# Patient Record
Sex: Female | Born: 1960 | Race: White | Hispanic: No | Marital: Married | Smoking: Former smoker
Health system: Southern US, Community
[De-identification: ages and names within clinical notes are randomized; demographics above are authoritative.]

## PROBLEM LIST (undated history)

## (undated) DIAGNOSIS — Z720 Tobacco use: Secondary | ICD-10-CM

## (undated) DIAGNOSIS — I2510 Atherosclerotic heart disease of native coronary artery without angina pectoris: Secondary | ICD-10-CM

## (undated) DIAGNOSIS — E119 Type 2 diabetes mellitus without complications: Secondary | ICD-10-CM

## (undated) DIAGNOSIS — Z8489 Family history of other specified conditions: Secondary | ICD-10-CM

## (undated) DIAGNOSIS — I255 Ischemic cardiomyopathy: Secondary | ICD-10-CM

## (undated) DIAGNOSIS — I2109 ST elevation (STEMI) myocardial infarction involving other coronary artery of anterior wall: Secondary | ICD-10-CM

## (undated) DIAGNOSIS — E785 Hyperlipidemia, unspecified: Secondary | ICD-10-CM

## (undated) DIAGNOSIS — R0600 Dyspnea, unspecified: Secondary | ICD-10-CM

## (undated) DIAGNOSIS — K219 Gastro-esophageal reflux disease without esophagitis: Secondary | ICD-10-CM

## (undated) DIAGNOSIS — R0609 Other forms of dyspnea: Secondary | ICD-10-CM

## (undated) DIAGNOSIS — J40 Bronchitis, not specified as acute or chronic: Secondary | ICD-10-CM

## (undated) DIAGNOSIS — I10 Essential (primary) hypertension: Secondary | ICD-10-CM

## (undated) DIAGNOSIS — J449 Chronic obstructive pulmonary disease, unspecified: Secondary | ICD-10-CM

## (undated) DIAGNOSIS — Z789 Other specified health status: Secondary | ICD-10-CM

## (undated) DIAGNOSIS — I509 Heart failure, unspecified: Secondary | ICD-10-CM

## (undated) HISTORY — PX: IR BILIARY DRAIN PLACEMENT WITH CHOLANGIOGRAM: IMG6043

---

## 2005-05-13 ENCOUNTER — Emergency Department: Payer: Self-pay | Admitting: Emergency Medicine

## 2006-08-27 ENCOUNTER — Ambulatory Visit: Payer: Self-pay

## 2006-09-08 ENCOUNTER — Ambulatory Visit: Payer: Self-pay

## 2009-11-19 ENCOUNTER — Ambulatory Visit: Payer: Self-pay

## 2012-09-08 ENCOUNTER — Ambulatory Visit: Payer: Self-pay | Admitting: Family Medicine

## 2014-07-17 ENCOUNTER — Emergency Department: Payer: Self-pay | Admitting: Emergency Medicine

## 2014-10-04 ENCOUNTER — Ambulatory Visit: Payer: Self-pay

## 2015-09-02 DIAGNOSIS — I2109 ST elevation (STEMI) myocardial infarction involving other coronary artery of anterior wall: Secondary | ICD-10-CM

## 2015-09-02 HISTORY — DX: ST elevation (STEMI) myocardial infarction involving other coronary artery of anterior wall: I21.09

## 2016-02-24 ENCOUNTER — Emergency Department
Admission: EM | Admit: 2016-02-24 | Discharge: 2016-02-24 | Disposition: A | Discharge status: Other Acute Inpatient Hospital | Payer: BC Managed Care – PPO | Attending: Emergency Medicine | Admitting: Emergency Medicine

## 2016-02-24 ENCOUNTER — Ambulatory Visit (HOSPITAL_COMMUNITY): Admit: 2016-02-24 | Payer: Self-pay | Admitting: Cardiovascular Disease

## 2016-02-24 ENCOUNTER — Other Ambulatory Visit: Payer: Self-pay

## 2016-02-24 ENCOUNTER — Encounter (HOSPITAL_COMMUNITY)
Admission: EM | Disposition: A | Discharge status: Home / Self Care | Payer: Self-pay | Source: Ambulatory Visit | Attending: Cardiovascular Disease

## 2016-02-24 ENCOUNTER — Emergency Department: Payer: BC Managed Care – PPO

## 2016-02-24 ENCOUNTER — Inpatient Hospital Stay (HOSPITAL_COMMUNITY)
Admission: EM | Admit: 2016-02-24 | Discharge: 2016-02-28 | DRG: 247 | Disposition: A | Discharge status: Home / Self Care | Payer: BC Managed Care – PPO | Source: Ambulatory Visit | Attending: Cardiovascular Disease | Admitting: Cardiovascular Disease

## 2016-02-24 ENCOUNTER — Encounter: Payer: Self-pay | Admitting: Emergency Medicine

## 2016-02-24 ENCOUNTER — Encounter (HOSPITAL_COMMUNITY): Payer: Self-pay | Admitting: Physician Assistant

## 2016-02-24 DIAGNOSIS — I2102 ST elevation (STEMI) myocardial infarction involving left anterior descending coronary artery: Secondary | ICD-10-CM | POA: Diagnosis not present

## 2016-02-24 DIAGNOSIS — F419 Anxiety disorder, unspecified: Secondary | ICD-10-CM | POA: Diagnosis present

## 2016-02-24 DIAGNOSIS — F17210 Nicotine dependence, cigarettes, uncomplicated: Secondary | ICD-10-CM | POA: Diagnosis present

## 2016-02-24 DIAGNOSIS — Z720 Tobacco use: Secondary | ICD-10-CM | POA: Diagnosis present

## 2016-02-24 DIAGNOSIS — R079 Chest pain, unspecified: Secondary | ICD-10-CM | POA: Diagnosis present

## 2016-02-24 DIAGNOSIS — I213 ST elevation (STEMI) myocardial infarction of unspecified site: Secondary | ICD-10-CM | POA: Insufficient documentation

## 2016-02-24 DIAGNOSIS — I255 Ischemic cardiomyopathy: Secondary | ICD-10-CM | POA: Diagnosis not present

## 2016-02-24 DIAGNOSIS — Z6836 Body mass index (BMI) 36.0-36.9, adult: Secondary | ICD-10-CM

## 2016-02-24 DIAGNOSIS — D72829 Elevated white blood cell count, unspecified: Secondary | ICD-10-CM | POA: Diagnosis present

## 2016-02-24 DIAGNOSIS — E669 Obesity, unspecified: Secondary | ICD-10-CM | POA: Diagnosis present

## 2016-02-24 DIAGNOSIS — I952 Hypotension due to drugs: Secondary | ICD-10-CM | POA: Diagnosis not present

## 2016-02-24 DIAGNOSIS — Z955 Presence of coronary angioplasty implant and graft: Secondary | ICD-10-CM | POA: Diagnosis not present

## 2016-02-24 DIAGNOSIS — E785 Hyperlipidemia, unspecified: Secondary | ICD-10-CM | POA: Diagnosis present

## 2016-02-24 DIAGNOSIS — I2589 Other forms of chronic ischemic heart disease: Secondary | ICD-10-CM | POA: Diagnosis not present

## 2016-02-24 DIAGNOSIS — R109 Unspecified abdominal pain: Secondary | ICD-10-CM | POA: Diagnosis present

## 2016-02-24 DIAGNOSIS — I2109 ST elevation (STEMI) myocardial infarction involving other coronary artery of anterior wall: Secondary | ICD-10-CM | POA: Diagnosis not present

## 2016-02-24 DIAGNOSIS — R7989 Other specified abnormal findings of blood chemistry: Secondary | ICD-10-CM | POA: Diagnosis not present

## 2016-02-24 DIAGNOSIS — R0602 Shortness of breath: Secondary | ICD-10-CM | POA: Diagnosis present

## 2016-02-24 DIAGNOSIS — I2510 Atherosclerotic heart disease of native coronary artery without angina pectoris: Secondary | ICD-10-CM

## 2016-02-24 HISTORY — DX: Tobacco use: Z72.0

## 2016-02-24 HISTORY — PX: CARDIAC CATHETERIZATION: SHX172

## 2016-02-24 HISTORY — DX: ST elevation (STEMI) myocardial infarction involving other coronary artery of anterior wall: I21.09

## 2016-02-24 LAB — CBC WITH DIFFERENTIAL/PLATELET
BAND NEUTROPHILS: 0 %
BASOS PCT: 0 %
Basophils Absolute: 0 10*3/uL (ref 0–0.1)
Blasts: 0 %
EOS PCT: 0 %
Eosinophils Absolute: 0 10*3/uL (ref 0–0.7)
HEMATOCRIT: 43.6 % (ref 35.0–47.0)
Hemoglobin: 15.2 g/dL (ref 12.0–16.0)
LYMPHS ABS: 3.5 10*3/uL (ref 1.0–3.6)
Lymphocytes Relative: 11 %
MCH: 33.5 pg (ref 26.0–34.0)
MCHC: 34.8 g/dL (ref 32.0–36.0)
MCV: 96.4 fL (ref 80.0–100.0)
METAMYELOCYTES PCT: 0 %
MONO ABS: 2.9 10*3/uL — AB (ref 0.2–0.9)
MYELOCYTES: 0 %
Monocytes Relative: 9 %
NEUTROS PCT: 80 %
Neutro Abs: 25.6 10*3/uL — ABNORMAL HIGH (ref 1.4–6.5)
Other: 0 %
PROMYELOCYTES ABS: 0 %
Platelets: 443 10*3/uL — ABNORMAL HIGH (ref 150–440)
RBC: 4.52 MIL/uL (ref 3.80–5.20)
RDW: 13.1 % (ref 11.5–14.5)
WBC: 32 10*3/uL — ABNORMAL HIGH (ref 3.6–11.0)
nRBC: 0 /100 WBC

## 2016-02-24 LAB — COMPREHENSIVE METABOLIC PANEL
ALK PHOS: 87 U/L (ref 38–126)
ALT: 87 U/L — AB (ref 14–54)
AST: 327 U/L — ABNORMAL HIGH (ref 15–41)
Albumin: 4.2 g/dL (ref 3.5–5.0)
Anion gap: 11 (ref 5–15)
BILIRUBIN TOTAL: 0.9 mg/dL (ref 0.3–1.2)
BUN: 11 mg/dL (ref 6–20)
CALCIUM: 9.6 mg/dL (ref 8.9–10.3)
CO2: 26 mmol/L (ref 22–32)
CREATININE: 0.78 mg/dL (ref 0.44–1.00)
Chloride: 92 mmol/L — ABNORMAL LOW (ref 101–111)
Glucose, Bld: 160 mg/dL — ABNORMAL HIGH (ref 65–99)
Potassium: 4 mmol/L (ref 3.5–5.1)
SODIUM: 129 mmol/L — AB (ref 135–145)
Total Protein: 7.6 g/dL (ref 6.5–8.1)

## 2016-02-24 LAB — TROPONIN I
Troponin I: 81.55 ng/mL — ABNORMAL HIGH (ref <0.031)
Troponin I: >65 ng/mL (ref <0.031)
Troponin I: >65 ng/mL (ref <0.031)

## 2016-02-24 LAB — PROTIME-INR
INR: 1.09
INR: 4.53 — ABNORMAL HIGH (ref 0.00–1.49)
PROTHROMBIN TIME: 45.6 s — AB (ref 11.6–15.2)
Prothrombin Time: 14.3 seconds (ref 11.4–15.0)

## 2016-02-24 LAB — TSH: TSH: 1.179 u[IU]/mL (ref 0.350–4.500)

## 2016-02-24 LAB — MRSA PCR SCREENING: MRSA BY PCR: NEGATIVE

## 2016-02-24 LAB — APTT: aPTT: 26 seconds (ref 24–36)

## 2016-02-24 LAB — BRAIN NATRIURETIC PEPTIDE: B Natriuretic Peptide: 584.5 pg/mL — ABNORMAL HIGH (ref 0.0–100.0)

## 2016-02-24 SURGERY — LEFT HEART CATH AND CORONARY ANGIOGRAPHY
Anesthesia: LOCAL

## 2016-02-24 MED ORDER — ASPIRIN EC 81 MG PO TBEC
81.0000 mg | DELAYED_RELEASE_TABLET | Freq: Every day | ORAL | Status: DC
  Administered 2016-02-25 – 2016-02-28 (×4): 81 mg via ORAL
  Filled 2016-02-24 (×4): qty 1

## 2016-02-24 MED ORDER — SODIUM CHLORIDE 0.9 % IV SOLN
250.0000 mg | INTRAVENOUS | Status: DC | PRN
  Administered 2016-02-24: 1.75 mg/kg/h via INTRAVENOUS

## 2016-02-24 MED ORDER — HEPARIN (PORCINE) IN NACL 100-0.45 UNIT/ML-% IJ SOLN: 900.0000 [IU]/h | INTRAMUSCULAR | Status: DC

## 2016-02-24 MED ORDER — MIDAZOLAM HCL 2 MG/2ML IJ SOLN
INTRAMUSCULAR | Status: AC
  Filled 2016-02-24: qty 2

## 2016-02-24 MED ORDER — HEPARIN (PORCINE) IN NACL 2-0.9 UNIT/ML-% IJ SOLN
INTRAMUSCULAR | Status: AC
  Filled 2016-02-24: qty 2000

## 2016-02-24 MED ORDER — VERAPAMIL HCL 2.5 MG/ML IV SOLN
INTRA_ARTERIAL | Status: DC | PRN
  Administered 2016-02-24: 10 mL via INTRA_ARTERIAL

## 2016-02-24 MED ORDER — ASPIRIN 81 MG PO CHEW
324.0000 mg | CHEWABLE_TABLET | Freq: Once | ORAL | Status: AC
  Administered 2016-02-24: 324 mg via ORAL

## 2016-02-24 MED ORDER — TICAGRELOR 90 MG PO TABS
ORAL_TABLET | ORAL | Status: AC
  Filled 2016-02-24: qty 2

## 2016-02-24 MED ORDER — NITROGLYCERIN IN D5W 200-5 MCG/ML-% IV SOLN
INTRAVENOUS | Status: AC
  Filled 2016-02-24: qty 250

## 2016-02-24 MED ORDER — SODIUM CHLORIDE 0.9% FLUSH: 3.0000 mL | INTRAVENOUS | Status: DC | PRN

## 2016-02-24 MED ORDER — SODIUM CHLORIDE 0.9% FLUSH
3.0000 mL | Freq: Two times a day (BID) | INTRAVENOUS | Status: DC
  Administered 2016-02-24 – 2016-02-28 (×7): 3 mL via INTRAVENOUS

## 2016-02-24 MED ORDER — NITROGLYCERIN 1 MG/10 ML FOR IR/CATH LAB
INTRA_ARTERIAL | Status: AC
  Filled 2016-02-24: qty 10

## 2016-02-24 MED ORDER — BIVALIRUDIN BOLUS VIA INFUSION - CUPID
INTRAVENOUS | Status: DC | PRN
  Administered 2016-02-24: 59.55 mg via INTRAVENOUS

## 2016-02-24 MED ORDER — HEPARIN (PORCINE) IN NACL 100-0.45 UNIT/ML-% IJ SOLN
1150.0000 [IU]/h | INTRAMUSCULAR | Status: DC
  Administered 2016-02-25: 900 [IU]/h via INTRAVENOUS
  Filled 2016-02-24: qty 250

## 2016-02-24 MED ORDER — FENTANYL CITRATE (PF) 100 MCG/2ML IJ SOLN
INTRAMUSCULAR | Status: AC
  Filled 2016-02-24: qty 2

## 2016-02-24 MED ORDER — FENTANYL CITRATE (PF) 100 MCG/2ML IJ SOLN
50.0000 ug | Freq: Once | INTRAMUSCULAR | Status: AC
  Administered 2016-02-24: 50 ug via INTRAVENOUS
  Filled 2016-02-24: qty 2

## 2016-02-24 MED ORDER — ALPRAZOLAM 0.25 MG PO TABS
0.2500 mg | ORAL_TABLET | Freq: Three times a day (TID) | ORAL | Status: DC | PRN
Start: 2016-02-24 — End: 2016-02-26
  Administered 2016-02-24 – 2016-02-26 (×3): 0.25 mg via ORAL
  Filled 2016-02-24 (×4): qty 1

## 2016-02-24 MED ORDER — MORPHINE SULFATE (PF) 2 MG/ML IV SOLN
2.0000 mg | INTRAVENOUS | Status: DC | PRN
  Administered 2016-02-24 – 2016-02-26 (×6): 2 mg via INTRAVENOUS
  Filled 2016-02-24 (×6): qty 1

## 2016-02-24 MED ORDER — NITROGLYCERIN 2 % TD OINT
1.0000 [in_us] | TOPICAL_OINTMENT | Freq: Once | TRANSDERMAL | Status: AC
  Administered 2016-02-24: 1 [in_us] via TOPICAL

## 2016-02-24 MED ORDER — IOPAMIDOL (ISOVUE-370) INJECTION 76%
INTRAVENOUS | Status: AC
  Filled 2016-02-24: qty 100

## 2016-02-24 MED ORDER — HEPARIN (PORCINE) IN NACL 100-0.45 UNIT/ML-% IJ SOLN
800.0000 [IU]/h | INTRAMUSCULAR | Status: DC
  Filled 2016-02-24: qty 250

## 2016-02-24 MED ORDER — ASPIRIN 300 MG RE SUPP: 300.0000 mg | RECTAL | Status: AC

## 2016-02-24 MED ORDER — VERAPAMIL HCL 2.5 MG/ML IV SOLN
INTRAVENOUS | Status: AC
  Filled 2016-02-24: qty 2

## 2016-02-24 MED ORDER — BIVALIRUDIN 250 MG IV SOLR
1.7500 mg/kg/h | INTRAVENOUS | Status: DC
  Administered 2016-02-24 (×3): 1.75 mg/kg/h via INTRAVENOUS
  Filled 2016-02-24 (×3): qty 250

## 2016-02-24 MED ORDER — MORPHINE SULFATE (PF) 2 MG/ML IV SOLN
INTRAVENOUS | Status: AC
  Filled 2016-02-24: qty 1

## 2016-02-24 MED ORDER — FENTANYL CITRATE (PF) 100 MCG/2ML IJ SOLN
INTRAMUSCULAR | Status: DC | PRN
  Administered 2016-02-24: 50 ug via INTRAVENOUS

## 2016-02-24 MED ORDER — IOPAMIDOL (ISOVUE-370) INJECTION 76%
INTRAVENOUS | Status: AC
  Filled 2016-02-24: qty 125

## 2016-02-24 MED ORDER — MIDAZOLAM HCL 2 MG/2ML IJ SOLN
INTRAMUSCULAR | Status: DC | PRN
  Administered 2016-02-24: 2 mg via INTRAVENOUS

## 2016-02-24 MED ORDER — LIDOCAINE HCL (PF) 1 % IJ SOLN
INTRAMUSCULAR | Status: DC | PRN
  Administered 2016-02-24: 3 mL via INTRADERMAL

## 2016-02-24 MED ORDER — ONDANSETRON HCL 4 MG/2ML IJ SOLN
4.0000 mg | Freq: Four times a day (QID) | INTRAMUSCULAR | Status: DC | PRN
  Administered 2016-02-24 – 2016-02-26 (×3): 4 mg via INTRAVENOUS
  Filled 2016-02-24 (×3): qty 2

## 2016-02-24 MED ORDER — HEPARIN BOLUS VIA INFUSION
4000.0000 [IU] | Freq: Once | INTRAVENOUS | Status: DC
  Filled 2016-02-24: qty 4000

## 2016-02-24 MED ORDER — CARVEDILOL 3.125 MG PO TABS
3.1250 mg | ORAL_TABLET | Freq: Two times a day (BID) | ORAL | Status: DC
  Administered 2016-02-25 – 2016-02-26 (×4): 3.125 mg via ORAL
  Filled 2016-02-24 (×3): qty 1

## 2016-02-24 MED ORDER — SODIUM CHLORIDE 0.9 % IV SOLN
INTRAVENOUS | Status: DC | PRN
  Administered 2016-02-24: 79 mL/h via INTRAVENOUS

## 2016-02-24 MED ORDER — HEPARIN SODIUM (PORCINE) 5000 UNIT/ML IJ SOLN
5000.0000 [IU] | Freq: Once | INTRAMUSCULAR | Status: DC
  Filled 2016-02-24: qty 1

## 2016-02-24 MED ORDER — NITROGLYCERIN IN D5W 200-5 MCG/ML-% IV SOLN
0.0000 ug/min | INTRAVENOUS | Status: DC
  Administered 2016-02-24: 5 ug/min via INTRAVENOUS

## 2016-02-24 MED ORDER — NITROGLYCERIN 1 MG/10 ML FOR IR/CATH LAB
INTRA_ARTERIAL | Status: DC | PRN
  Administered 2016-02-24 (×2): 200 ug via INTRACORONARY

## 2016-02-24 MED ORDER — TICAGRELOR 90 MG PO TABS
ORAL_TABLET | ORAL | Status: DC | PRN
  Administered 2016-02-24: 180 mg via ORAL

## 2016-02-24 MED ORDER — ACETAMINOPHEN 325 MG PO TABS
650.0000 mg | ORAL_TABLET | ORAL | Status: DC | PRN
  Administered 2016-02-24 – 2016-02-27 (×8): 650 mg via ORAL
  Filled 2016-02-24 (×9): qty 2

## 2016-02-24 MED ORDER — SODIUM CHLORIDE 0.9 % IV SOLN
250.0000 mL | INTRAVENOUS | Status: DC | PRN
  Administered 2016-02-24: 10 mL via INTRAVENOUS

## 2016-02-24 MED ORDER — IOPAMIDOL (ISOVUE-370) INJECTION 76%
INTRAVENOUS | Status: DC | PRN
  Administered 2016-02-24: 190 mL via INTRA_ARTERIAL

## 2016-02-24 MED ORDER — ATORVASTATIN CALCIUM 80 MG PO TABS
80.0000 mg | ORAL_TABLET | Freq: Every day | ORAL | Status: DC
  Administered 2016-02-25 – 2016-02-27 (×3): 80 mg via ORAL
  Filled 2016-02-24 (×3): qty 1

## 2016-02-24 MED ORDER — HEPARIN SODIUM (PORCINE) 5000 UNIT/ML IJ SOLN
5000.0000 [IU] | Freq: Once | INTRAMUSCULAR | Status: AC
  Administered 2016-02-24: 5000 [IU] via INTRAVENOUS

## 2016-02-24 MED ORDER — BIVALIRUDIN 250 MG IV SOLR
INTRAVENOUS | Status: AC
Start: 2016-02-24 — End: 2016-02-24
  Filled 2016-02-24: qty 250

## 2016-02-24 MED ORDER — NITROGLYCERIN 1 MG/10 ML FOR IR/CATH LAB
INTRA_ARTERIAL | Status: DC | PRN
  Administered 2016-02-24: 15:00:00

## 2016-02-24 MED ORDER — LIDOCAINE HCL (PF) 1 % IJ SOLN
INTRAMUSCULAR | Status: AC
  Filled 2016-02-24: qty 30

## 2016-02-24 MED ORDER — NITROGLYCERIN 0.4 MG SL SUBL
0.4000 mg | SUBLINGUAL_TABLET | SUBLINGUAL | Status: DC | PRN
  Administered 2016-02-24: 0.4 mg via SUBLINGUAL
  Filled 2016-02-24: qty 1

## 2016-02-24 MED ORDER — ASPIRIN 81 MG PO CHEW
324.0000 mg | CHEWABLE_TABLET | ORAL | Status: AC
  Administered 2016-02-24: 324 mg via ORAL
  Filled 2016-02-24: qty 4

## 2016-02-24 SURGICAL SUPPLY — 21 items
BALLN EUPHORA RX 2.5X12 (BALLOONS) ×2
BALLN ~~LOC~~ EUPHORA RX 3.5X15 (BALLOONS) ×2
BALLOON EUPHORA RX 2.5X12 (BALLOONS) ×1 IMPLANT
BALLOON ~~LOC~~ EUPHORA RX 3.5X15 (BALLOONS) ×1 IMPLANT
CATH INFINITI 5FR ANG PIGTAIL (CATHETERS) ×2 IMPLANT
CATH INFINITI JR4 5F (CATHETERS) ×2 IMPLANT
CATH VISTA GUIDE 6FR XBLAD3.5 (CATHETERS) ×2 IMPLANT
DEVICE RAD COMP TR BAND LRG (VASCULAR PRODUCTS) ×2 IMPLANT
GLIDESHEATH SLEND SS 6F .021 (SHEATH) ×2 IMPLANT
KIT ENCORE 26 ADVANTAGE (KITS) ×2 IMPLANT
KIT HEART LEFT (KITS) ×2 IMPLANT
PACK CARDIAC CATHETERIZATION (CUSTOM PROCEDURE TRAY) ×2 IMPLANT
SHEATH PINNACLE 6F 10CM (SHEATH) IMPLANT
STENT XIENCE ALPINE RX 3.25X18 (Permanent Stent) ×2 IMPLANT
SYR MEDRAD MARK V 150ML (SYRINGE) ×2 IMPLANT
TRANSDUCER W/STOPCOCK (MISCELLANEOUS) ×2 IMPLANT
TUBING CIL FLEX 10 FLL-RA (TUBING) ×2 IMPLANT
WIRE EMERALD 3MM-J .035X150CM (WIRE) IMPLANT
WIRE MICROINTRODUCER 60CM (WIRE) ×2 IMPLANT
WIRE PT2 MS 185 (WIRE) ×2 IMPLANT
WIRE SAFE-T 1.5MM-J .035X260CM (WIRE) ×2 IMPLANT

## 2016-02-24 NOTE — ED Notes (Signed)
Troponin 81.55. Hannah at cath lab notified.

## 2016-02-24 NOTE — Progress Notes (Signed)
ANTICOAGULATION CONSULT NOTE - Initial Consult  Pharmacy Consult for Heparin Indication: chest pain/ACS  Allergies  Allergen Reactions  . Elastic Bandages & [Zinc] Rash    Patient Measurements: Height: 5\' 2"  (157.5 cm) Weight: 175 lb (79.379 kg) IBW/kg (Calculated) : 50.1 Heparin Dosing Weight: 67.7 kg  Vital Signs: Temp: 98 F (36.7 C) (06/25 1241) Temp Source: Oral (06/25 1241) BP: 148/102 mmHg (06/25 1256) Pulse Rate: 102 (06/25 1256)  Labs: No results for input(s): HGB, HCT, PLT, APTT, LABPROT, INR, HEPARINUNFRC, HEPRLOWMOCWT, CREATININE, CKTOTAL, CKMB, TROPONINI in the last 72 hours.  CrCl cannot be calculated (Patient has no serum creatinine result on file.).   Medical History: History reviewed. No pertinent past medical history.  Medications:   (Not in a hospital admission) Scheduled:  . heparin  5,000 Units Intravenous Once   Infusions:    Assessment: 55 y/o F admitted with CP. Not on anticoagulants PTA per RN.   Goal of Therapy:  Heparin level 0.3-0.7 units/ml Monitor platelets by anticoagulation protocol: Yes   Plan:  Give 4000 units bolus x 1 Start heparin infusion at 800 units/hr Check anti-Xa level in 6 hours and daily while on heparin Continue to monitor H&H and platelets  Ulice Dash D 02/24/2016,1:19 PM

## 2016-02-24 NOTE — ED Notes (Signed)
Patient to ER for c/o shortness of breath since this am. Also reports having pain to upper back on Friday. States she had not urinated in two days until today. +Vomiting.

## 2016-02-24 NOTE — ED Provider Notes (Addendum)
[facility] Emergency Department Provider Note  ____________________________________________   I have reviewed the triage vital signs and the nursing notes.   HISTORY  Chief Complaint Shortness of Breath    HPI Erika Rios is a 55 y.o. female with a history of tobacco abuse, denies other medical history that she knows of presents today complaining of chest pain which is substernal, sometimes radiates to the back. Off-and-on for 2 days. Not particularly exertional. Has had vomiting with it. It was not maximum intensity at onset. Started Friday afternoon. This is substernal pressure. Patient states that she has been able to ambulate and sometimes it feels worse when she walks. She denies any numbness or weakness. She has not had pain like this before. She states that the pain has been coming and going. His been there almost all day today and yesterday however. Started yesterday and 11 after abating the previous day at 8. Pain is not pleuritic, she has no personal or family history of PE or DVT, no recent travel, no leg swelling denies pregnancy.       History reviewed. No pertinent past medical history.  There are no active problems to display for this patient.   History reviewed. No pertinent past surgical history.  No current outpatient prescriptions on file.  Allergies Elastic bandages &  No family history on file.  Social History Social History  Substance Use Topics  . Smoking status: Current Every Day Smoker  . Smokeless tobacco: None  . Alcohol Use: No    Review of Systems Constitutional: No fever/chills Eyes: No visual changes. ENT: No sore throat. No stiff neck no neck pain Cardiovascular: Denies chest pain. Respiratory: Positive shortness of breath. Gastrointestinal:   Positive vomiting.  No diarrhea.  No constipation. Genitourinary: Negative for dysuria. Musculoskeletal: Negative lower extremity swelling Skin: Negative for  rash. Neurological: Negative for headaches, focal weakness or numbness. 10-point ROS otherwise negative.  ____________________________________________   PHYSICAL EXAM:  VITAL SIGNS: ED Triage Vitals  Enc Vitals Group     BP 02/24/16 1241 122/73 mmHg     Pulse Rate 02/24/16 1241 101     Resp 02/24/16 1241 18     Temp 02/24/16 1241 98 F (36.7 C)     Temp Source 02/24/16 1241 Oral     SpO2 02/24/16 1241 96 %     Weight 02/24/16 1241 175 lb (79.379 kg)     Height 02/24/16 1241 5\' 2"  (1.575 m)     Head Cir --      Peak Flow --      Pain Score 02/24/16 1238 7     Pain Loc --      Pain Edu? --      Excl. in [place]? --     Constitutional: Alert and oriented. Appears somewhat uncomfortable and nontoxic Eyes: Conjunctivae are normal. PERRL. EOMI. Head: Atraumatic. Nose: No congestion/rhinnorhea. Mouth/Throat: Mucous membranes are moist.  Oropharynx non-erythematous. Neck: No stridor.   Nontender with no meningismus Cardiovascular: Normal rate, regular rhythm. Grossly normal heart sounds.  Good peripheral circulation. Minimal tenderness to palpation the chest wall Respiratory: Normal respiratory effort.  No retractions. Lungs CTAB. Abdominal: Soft and nontender. No distention. No guarding no rebound Back:  There is no focal tenderness or step off there is no midline tenderness there are no lesions noted. there is no CVA tenderness Musculoskeletal: No lower extremity tenderness. No joint effusions, no DVT signs strong distal pulses no edema Neurologic:  Normal speech and language. No  gross focal neurologic deficits are appreciated.  Skin:  Skin is warm, dry and intact. No rash noted. Psychiatric: Mood and affect are normal. Speech and behavior are normal.  ____________________________________________   LABS (all labs ordered are listed, but only abnormal results are displayed)  Labs Reviewed  TROPONIN I  CBC WITH DIFFERENTIAL/PLATELET  PROTIME-INR  COMPREHENSIVE METABOLIC PANEL    ____________________________________________  EKG  I personally interpreted any EKGs ordered by me or triage Initial EKG showed rate 102 sinus tach, questionable ST elevation in V2 and V1 but also appeared to be repoll, second EKG sinus tach rate 104, increased ST elevation in V2 mild ST elevation in V1 with ongoing ST elevation in V1, ____________________________________________  RADIOLOGY  I reviewed any imaging ordered by me or triage that were performed during my shift and, if possible, patient and/or family made aware of any abnormal findings. ____________________________________________   PROCEDURES  Procedure(s) performed: None  Critical Care performed: None  ____________________________________________   INITIAL IMPRESSION / ASSESSMENT AND PLAN / ED COURSE  Pertinent labs & imaging results that were available during my care of the patient were reviewed by me and considered in my medical decision making (see chart for details).  Patient with somewhat reproducible chest pain but very concerning EKG. Risk factors for ACS include tobacco abuse. No risk factors for PE known. Not consistent with PE, not pleuritic pain. Patient has had pain since Friday. The sometimes radiate to the back but does not sound like it wasn't maximal in onset and initiation, she has strong distal pulses which are symmetric. Low suspicion therefore for dissection, she does not really have risk factors for dissection, does not have a history of hypertension for example.  ----------------------------------------- 1:12 PM on 02/24/2016 -----------------------------------------  Discussed her care with Dr. Claiborne Billings, cardiology they agree with plan, will send her for ST EMI protocol intervention.   ----------------------------------------- 1:20 PM on 02/24/2016 ----------------------------------------- Patient states her pain is much better 3 or 4 out of 10 as opposed 8 of 10 before. Pressure is  holding after our interventions thus far. EMS truck is on the way. Receiving heparin here.  ____________________________________________   FINAL CLINICAL IMPRESSION(S) / ED DIAGNOSES  Final diagnoses:  Chest pain      This chart was dictated using voice recognition software.  Despite best efforts to proofread,  errors can occur which can change meaning.     Schuyler Amor, MD 02/24/16 [place], MD 02/24/16 [place], MD 02/24/16 1320

## 2016-02-24 NOTE — Progress Notes (Signed)
EKG CRITICAL VALUE     12 lead EKG performed.  Critical value noted.  Berniece Salines, RN notified.   Asencion Gowda, CCT 02/24/2016 3:55 PM

## 2016-02-24 NOTE — H&P (Signed)
Patient ID: Erika Rios MRN: [id number], DOB/AGE: June 19, 1961   Admit date: 02/24/2016  Primary Physician: Golden Pop, MD Primary Cardiologist: New Reason for admission: STEMI  Pt. Profile:  Erika Rios is a 55 y.o. female with a history of tobacco abuse and no past medical history by virtue of not seeing a medical doctor in over 20 years who was transferred from [facility] to [facility] for anterolateral STEMI.  She denies a history of diabetes, hypertension, hyperlipidemia, CVA, TIA. She denies a family history of heart disease except her mother who had cancer "all over her body" and "her heart exploded."   She smokes half a pack a day for over 30 years. She does not take any medications.  She was in her usual state of health until this past Friday when she started to notice chest and back pain. The back pain felt like a stabbing pain that was associated with shortness of breath and nausea/vomiting. The pain was constant until her husband persuaded her to seek medical attention. Not related to exertion, actually improved with exertion. She presented to [facility] for evaluation and ECG showed Anterolateral ST elevation. She was transferred to [facility] for emergent heart catheterization as a Code STEMI. Initial troponin over 85.   Problem List  Past Medical History  Diagnosis Date  . Tobacco abuse   . ST elevation myocardial infarction (STEMI) of anterior wall, initial episode of care [facility])     No past surgical history on file.   Allergies  Allergies  Allergen Reactions  . Elastic Bandages & [Zinc] Rash     Home Medications  Prior to Admission medications   Not on File    Family History  Family History  Problem Relation Age of Onset  . Cancer Mother   . Diabetes Father     Social History  Social History   Social History  . Marital Status: Married    Spouse Name: N/A  . Number of Children: N/A  . Years of Education: N/A   Occupational History  . Not on file.    Social History Main Topics  . Smoking status: Current Every Day Smoker  . Smokeless tobacco: Not on file  . Alcohol Use: No  . Drug Use: Not on file  . Sexual Activity: Not on file   Other Topics Concern  . Not on file   Social History Narrative     Review of Systems General:  No chills, fever, night sweats or weight changes.  Cardiovascular: + chest pain, dyspnea on exertion, edema, orthopnea, palpitations, paroxysmal nocturnal dyspnea. Dermatological: No rash, lesions/masses Respiratory: No cough, +dyspnea Urologic: No hematuria, dysuria Abdominal:   +nausea, +vomiting, diarrhea, bright red blood per rectum, melena, or hematemesis Neurologic:  No visual changes, wkns, changes in mental status. All other systems reviewed and are otherwise negative except as noted above.  Physical Exam  There were no vitals taken for this visit.  General: Pleasant, NAD. Moderate distress Psych: Normal affect. Neuro: Alert and oriented X 3. Moves all extremities spontaneously. HEENT: Normal  Neck: Supple without bruits or JVD. Lungs:  Resp regular and unlabored, CTA. Heart: RRR no s3, s4, or murmurs. Abdomen: Soft, non-tender, non-distended, BS + x 4.  Extremities: No clubbing, cyanosis or edema. DP/PT/Radials 2+ and equal bilaterally.  Labs   Recent Labs  02/24/16 1258  TROPONINI 81.55*   Lab Results  Component Value Date   WBC 32.0* 02/24/2016   HGB 15.2 02/24/2016   HCT 43.6 02/24/2016  MCV 96.4 02/24/2016   PLT 443* 02/24/2016     Recent Labs Lab 02/24/16 1258  NA 129*  K 4.0  CL 92*  CO2 26  BUN 11  CREATININE 0.78  CALCIUM 9.6  PROT 7.6  BILITOT 0.9  ALKPHOS 87  ALT 87*  AST 327*  GLUCOSE 160*   No results found for: CHOL, HDL, LDLCALC, TRIG No results found for: DDIMER   Radiology/Studies  Dg Chest Port 1 View  02/24/2016  CLINICAL DATA:  Intermittent chest pain for 2 days, shortness of breath EXAM: PORTABLE CHEST 1 VIEW COMPARISON:  None.  FINDINGS: Cardiomediastinal silhouette is unremarkable. No acute infiltrate or pleural effusion. No pulmonary edema. Mild basilar atelectasis. Bony thorax is unremarkable. IMPRESSION: No infiltrate or pulmonary edema.  Mild basilar atelectasis. Electronically Signed   By: Lahoma Crocker M.D.   On: 02/24/2016 13:54    ECG  NSR with ST elevation in V2 through V5 as well as lead 1. A fine wander in lead 2 and aVF.  ASSESSMENT AND PLAN  Erika Rios is a 55 y.o. female with a history of tobacco abuse and no past medical history by virtue of not seeing a medical doctor in over 20 years who was transferred from  [facility] to [facility] for anterolateral STEMI.  Plan: Emergent heart catheterization and probable PCI. Will order hemoglobin A1c, lipid panel, BMET and CBC for further risk stratification. Will add Coreg 3.125mg  BID and Atorvastatin 80mg  daily.    SignedAngelena Form, PA-C 02/24/2016, 2:53 PM  Pager [phone]   Patient seen and examined. Agree with assessment and plan.  Erika Rios is a 55 year old female who admits to a greater than 30 year history of tobacco use.  She has not seen a physician in over 20 years.  2 days ago, she began to experience chest pressure radiating to her back.  Her chest pain persisted.  She also will he developed increasing shortness of breath with nausea and vomiting.  Today she finally presented to [facility] emergency room where her ECG suggested acute ST segment elevation anterior wall myocardial infarction.  She was given heparin 5000 units.  A code STEMI was activated and she is transported to [facility] for emergent cardiac catheterization.  Subsequent troponin level came back 81.  Upon arrival to the catheterization laboratory.  Her chest pain has improved but was still present.  Plan emergent cardiac catheterization and probable PCI of the LAD.   Troy Sine, MD, [facility] 02/24/2016 3:52 PM

## 2016-02-24 NOTE — Progress Notes (Addendum)
ANTICOAGULATION CONSULT NOTE - Initial Consult  Pharmacy Consult for heparin Indication: chest pain/ACS  Allergies  Allergen Reactions  . Elastic Bandages & [Zinc] Rash    Patient Measurements: Height: 5\' 2"  (157.5 cm) Weight: 201 lb 1 oz (91.2 kg) IBW/kg (Calculated) : 50.1 Heparin Dosing Weight: 71 kg  Vital Signs: Temp: 98 F (36.7 C) (06/25 1241) Temp Source: Oral (06/25 1241) BP: 116/84 mmHg (06/25 1600) Pulse Rate: 98 (06/25 1600)  Labs:  Recent Labs  02/24/16 1258  HGB 15.2  HCT 43.6  PLT 443*  APTT 26  LABPROT 14.3  INR 1.09  CREATININE 0.78  TROPONINI 81.55*    Estimated Creatinine Clearance: 84.4 mL/min (by C-G formula based on Cr of 0.78).   Medical History: Past Medical History  Diagnosis Date  . Tobacco abuse   . ST elevation myocardial infarction (STEMI) of anterior wall, initial episode of care [facility])     Medications:  See EMR  Assessment: Heparin for ACS S/p cath this afternoon with PCI with DES to LAD hgb 15.2, plts ok On bival to end at 2030   Goal of Therapy:  Heparin level 0.3-0.7 units/ml Monitor platelets by anticoagulation protocol: Yes    Plan:  -Heparin 900 units/hr to begin at 0400, 8 hr after TR band removed -Daily HL, CBC -Check confirmatory level -F/u duration of heparn    Harvel Quale 02/24/2016,4:57 PM

## 2016-02-25 ENCOUNTER — Encounter (HOSPITAL_COMMUNITY): Payer: Self-pay | Admitting: Cardiovascular Disease

## 2016-02-25 DIAGNOSIS — I2102 ST elevation (STEMI) myocardial infarction involving left anterior descending coronary artery: Secondary | ICD-10-CM

## 2016-02-25 DIAGNOSIS — E669 Obesity, unspecified: Secondary | ICD-10-CM

## 2016-02-25 DIAGNOSIS — F17200 Nicotine dependence, unspecified, uncomplicated: Secondary | ICD-10-CM

## 2016-02-25 DIAGNOSIS — I255 Ischemic cardiomyopathy: Secondary | ICD-10-CM

## 2016-02-25 DIAGNOSIS — R7989 Other specified abnormal findings of blood chemistry: Secondary | ICD-10-CM

## 2016-02-25 LAB — LIPID PANEL
Cholesterol: 170 mg/dL (ref 0–200)
HDL: 50 mg/dL (ref >40)
LDL CALC: 102 mg/dL — AB (ref 0–99)
TRIGLYCERIDES: 90 mg/dL (ref <150)
Total CHOL/HDL Ratio: 3.4 RATIO
VLDL: 18 mg/dL (ref 0–40)

## 2016-02-25 LAB — POCT ACTIVATED CLOTTING TIME: Activated Clotting Time: 511 seconds

## 2016-02-25 LAB — COMPREHENSIVE METABOLIC PANEL
ALBUMIN: 3.4 g/dL — AB (ref 3.5–5.0)
ALK PHOS: 80 U/L (ref 38–126)
ALT: 82 U/L — AB (ref 14–54)
AST: 234 U/L — ABNORMAL HIGH (ref 15–41)
Anion gap: 12 (ref 5–15)
BUN: 9 mg/dL (ref 6–20)
CALCIUM: 9.2 mg/dL (ref 8.9–10.3)
CHLORIDE: 99 mmol/L — AB (ref 101–111)
CO2: 22 mmol/L (ref 22–32)
CREATININE: 0.82 mg/dL (ref 0.44–1.00)
GFR calc Af Amer: >60 mL/min (ref >60)
GFR calc non Af Amer: >60 mL/min (ref >60)
GLUCOSE: 144 mg/dL — AB (ref 65–99)
Potassium: 4.3 mmol/L (ref 3.5–5.1)
SODIUM: 133 mmol/L — AB (ref 135–145)
Total Bilirubin: 1 mg/dL (ref 0.3–1.2)
Total Protein: 6.8 g/dL (ref 6.5–8.1)

## 2016-02-25 LAB — HEPARIN LEVEL (UNFRACTIONATED): Heparin Unfractionated: 0.12 IU/mL — ABNORMAL LOW (ref 0.30–0.70)

## 2016-02-25 LAB — PROTIME-INR
INR: 1.23 (ref 0.00–1.49)
PROTHROMBIN TIME: 15.7 s — AB (ref 11.6–15.2)

## 2016-02-25 LAB — CBC
HCT: 40.1 % (ref 36.0–46.0)
HEMOGLOBIN: 13.6 g/dL (ref 12.0–15.0)
MCH: 32.5 pg (ref 26.0–34.0)
MCHC: 33.9 g/dL (ref 30.0–36.0)
MCV: 95.9 fL (ref 78.0–100.0)
PLATELETS: 312 10*3/uL (ref 150–400)
RBC: 4.18 MIL/uL (ref 3.87–5.11)
RDW: 13 % (ref 11.5–15.5)
WBC: 20.9 10*3/uL — ABNORMAL HIGH (ref 4.0–10.5)

## 2016-02-25 LAB — HEMOGLOBIN A1C
Hgb A1c MFr Bld: 5.9 % — ABNORMAL HIGH (ref 4.8–5.6)
Mean Plasma Glucose: 123 mg/dL

## 2016-02-25 LAB — TROPONIN I: Troponin I: >65 ng/mL (ref <0.031)

## 2016-02-25 MED ORDER — SPIRONOLACTONE 25 MG PO TABS
12.5000 mg | ORAL_TABLET | Freq: Every day | ORAL | Status: DC
  Administered 2016-02-25 – 2016-02-28 (×4): 12.5 mg via ORAL
  Filled 2016-02-25 (×4): qty 1

## 2016-02-25 MED ORDER — TICAGRELOR 90 MG PO TABS
90.0000 mg | ORAL_TABLET | Freq: Two times a day (BID) | ORAL | Status: DC
  Administered 2016-02-25 – 2016-02-28 (×7): 90 mg via ORAL
  Filled 2016-02-25 (×7): qty 1

## 2016-02-25 MED ORDER — LISINOPRIL 2.5 MG PO TABS
2.5000 mg | ORAL_TABLET | Freq: Every day | ORAL | Status: DC
Start: 2016-02-25 — End: 2016-02-28
  Administered 2016-02-25 – 2016-02-27 (×3): 2.5 mg via ORAL
  Filled 2016-02-25 (×4): qty 1

## 2016-02-25 MED ORDER — CARVEDILOL 3.125 MG PO TABS
3.1250 mg | ORAL_TABLET | Freq: Two times a day (BID) | ORAL | Status: DC
  Filled 2016-02-25: qty 1

## 2016-02-25 MED ORDER — HEPARIN (PORCINE) IN NACL 100-0.45 UNIT/ML-% IJ SOLN
1550.0000 [IU]/h | INTRAMUSCULAR | Status: DC
  Administered 2016-02-26: 1350 [IU]/h via INTRAVENOUS
  Administered 2016-02-26: 1550 [IU]/h via INTRAVENOUS
  Filled 2016-02-25 (×2): qty 250

## 2016-02-25 NOTE — Care Management Note (Addendum)
Case Management Note  Patient Details  Name: Erika Rios MRN: [id number] Date of Birth: 02/02/1961  Subjective/Objective:                Adm w mi    Action/Plan: lives w husband, pcp dr Freddi Starr   Expected Discharge Date:                  Expected Discharge Plan:  Home/Self Care  In-House Referral:     Discharge planning Services  CM Consult, Medication Assistance  Post Acute Care Choice:    Choice offered to:     DME Arranged:    DME Agency:     HH Arranged:    HH Agency:     Status of Service:     If discussed at [employer], dates discussed:    Additional Comments: gave pt 30day free and copay card for brilinta. Has bcbs ins.  Lacretia Leigh, RN 02/25/2016, 10:41 AM

## 2016-02-25 NOTE — Progress Notes (Signed)
Subjective:  Chest pain resolved; feels better  Objective:   Vital Signs : Filed Vitals:   02/25/16 0450 02/25/16 0500 02/25/16 0600 02/25/16 0700  BP:  104/71 114/76 120/81  Pulse:  91 91 97  Temp:      TempSrc:      Resp:  '17 17 11  ' Height:      Weight: 202 lb 13.2 oz (92 kg)     SpO2:  99% 98% 98%    Intake/Output from previous day:  Intake/Output Summary (Last 24 hours) at 02/25/16 0809 Last data filed at 02/25/16 0700  Gross per 24 hour  Intake  633.4 ml  Output   1825 ml  Net -1191.6 ml    I/O since admission: -1191  Wt Readings from Last 3 Encounters:  02/25/16 202 lb 13.2 oz (92 kg)  02/24/16 175 lb (79.379 kg)    Medications: . aspirin EC  81 mg Oral Daily  . atorvastatin  80 mg Oral q1800  . carvedilol  3.125 mg Oral BID WC  . sodium chloride flush  3 mL Intravenous Q12H    . heparin 900 Units/hr (02/25/16 0415)  . nitroGLYCERIN 5 mcg/min (02/25/16 0421)    Physical Exam:   General appearance: alert, cooperative, no distress and obese. Neck: no adenopathy, no carotid bruit, no JVD, supple, symmetrical, trachea midline and thyroid not enlarged, symmetric, no tenderness/mass/nodules Lungs: decrease BS at bases Heart: regular rate and rhythm, no rub and faint 1/6 systolic murmur Abdomen: soft, non-tender; bowel sounds normal; no masses,  no organomegaly and liver not enlarged or tender Extremities: no edema, redness or tenderness in the calves or thighs Pulses: 2+ and symmetric; R radial site stable  Skin: Skin color, texture, turgor normal. No rashes or lesions Neurologic: Grossly normal   Rate: 96  Rhythm: normal sinus rhythm  ECG (independently read by me): NSR at 96; Q V1-5 with persistent ST elevation c/w anterior MI which probably began 2 days PTA  Lab Results:   Recent Labs  02/24/16 1258 02/25/16 0319  NA 129* 133*  K 4.0 4.3  CL 92* 99*  CO2 26 22  GLUCOSE 160* 144*  BUN 11 9  CREATININE 0.78 0.82  CALCIUM 9.6 9.2     Hepatic Function Latest Ref Rng 02/25/2016 02/24/2016  Total Protein 6.5 - 8.1 g/dL 6.8 7.6  Albumin 3.5 - 5.0 g/dL 3.4(L) 4.2  AST 15 - 41 U/L 234(H) 327(H)  ALT 14 - 54 U/L 82(H) 87(H)  Alk Phosphatase 38 - 126 U/L 80 87  Total Bilirubin 0.3 - 1.2 mg/dL 1.0 0.9     Recent Labs  02/24/16 1258 02/25/16 0319  WBC 32.0* 20.9*  NEUTROABS 25.6*  --   HGB 15.2 13.6  HCT 43.6 40.1  MCV 96.4 95.9  PLT 443* 312     Recent Labs  02/24/16 1754 02/24/16 2122 02/25/16 0319  TROPONINI >65.00* >65.00* >65.00*    Lab Results  Component Value Date   TSH 1.179 02/24/2016   No results for input(s): HGBA1C in the last 72 hours.   Recent Labs  02/24/16 1258 02/25/16 0319  PROT 7.6 6.8  ALBUMIN 4.2 3.4*  AST 327* 234*  ALT 87* 82*  ALKPHOS 87 80  BILITOT 0.9 1.0    Recent Labs  02/25/16 0319  INR 1.23   BNP (last 3 results)  Recent Labs  02/24/16 1800  BNP 584.5*    ProBNP (last 3 results) No results for input(s): PROBNP in the last  8760 hours.   Lipid Panel     Component Value Date/Time   CHOL 170 02/25/2016 0319   TRIG 90 02/25/2016 0319   HDL 50 02/25/2016 0319   CHOLHDL 3.4 02/25/2016 0319   VLDL 18 02/25/2016 0319   LDLCALC 102* 02/25/2016 0319      Imaging:  Dg Chest Port 1 View  02/24/2016  CLINICAL DATA:  Intermittent chest pain for 2 days, shortness of breath EXAM: PORTABLE CHEST 1 VIEW COMPARISON:  None. FINDINGS: Cardiomediastinal silhouette is unremarkable. No acute infiltrate or pleural effusion. No pulmonary edema. Mild basilar atelectasis. Bony thorax is unremarkable. IMPRESSION: No infiltrate or pulmonary edema.  Mild basilar atelectasis. Electronically Signed   By: Lahoma Crocker M.D.   On: 02/24/2016 13:54    EMERGENT CATH/PCI: 02/24/16  Conclusion     2nd Mrg lesion, 20% stenosed.  Prox LAD-1 lesion, 40% stenosed.  Prox LAD-2 lesion, 99% stenosed. Post intervention, there is a 0% residual stenosis.  There is moderate to  severe left ventricular systolic dysfunction.   Acute LV dysfunction with severe hypokinesis involving the mid and distal anterolateral wall apex and distal inferoapical segment with a global ejection fraction at 25-30%. LVEDP was elevated at 26 mm Hg.  Subtotal 99-100% stenosis in the proximal LAD with thrombus and TIMI 1 flow.  Mild 20% narrowing in the marginal branch of a dominant left circumflex coronary artery.  Normal nondominant RCA.  Successful PCI to the proximal LAD with ultimate stenting with a 3.2518 mm Xience Alpine DES stent postdilated to 3.5 mm with the 99% stenosis being reduced to 0% and resumption of brisk TIMI 3 flow.  RECOMMENDATION: The patient will require dual antiplatelet therapy for minimum of a year. With her ischemic cardiomyopathy she will be started on low-dose carvedilol and ACE inhibition with plans for dose titration. Aggressive lipid-lowering therapy will be instituted. Smoking cessation is imperative.     Indications    ST elevation myocardial infarction involving left anterior descending (LAD) coronary artery (HCC) [I21.02 (ICD-10-CM)]    Technique and Indications    [name] is a 54 year old female who has a > 30 year history of tobacco use. She has not seen a doctor in over 20 years. Two days ago she began to experience substernal chest pressure radiating to her back. Her chest pain persisted throughout the weekend. Today she presented to [facility] emergency room. Her ECG revealed ST segment elevation anterior MI. A troponin level was sent which came back 81.55. A code STEMI was activated; she was given heparin 5000 units and was transported to [facility] for emergent cardiac catheterization.  Upon arrival to the catheterization laboratory the patient's chest pain had improved but she still had mild discomfort The patient was premedicated with Versed 2 mg and fentanyl 50 mcg. A right radial approach was utilized after an  Allen's test verified adequate circulation. The right radial artery was punctured via the Seldinger technique, and a 6 [place] Glidesheath Slender was inserted without difficulty. A radial cocktail consisting of Verapamil, IV nitroglycerin, and lidocaine was administered. Weight adjusted heparin was administered. A safety J wire was advanced into the ascending aorta. Diagnostic catheterization was done with a 5 [place] JR4, and a 6 [place] XB LAD 3.5 guide catheter. With the demonstration of a 99/100% stenosis in the proximal LAD with TIMI 1 flow the decision was made to proceed with PCI. Angiomax bolus plus infusion was administered. The patient received Brilinta 180 mg during the interventional procedure. A Choice PT  moderate support wire was able to cross the subtotal occlusion. Predilatation was done with a 2.512 mm balloon. A 3.2518 mm Xience Alpine DES stent was then inserted which covered both the 40% and 99% stenosis. This was deployed up at 14 and 15 atm. Marland Kitchen Post stent dilatation was done with a 3.512 mm noncompliant balloon. A 5 French pigtail catheter was used for left ventriculography. A TR radial band was applied for hemostasis. The patient left the catheterization laboratory chest pain-free with stable hemodynamics. During this procedure the patient was administered a total of Versed 2 mg and Fentanyl 50 mg to achieve and maintain moderate conscious sedation. The patient's heart rate, blood pressure, and oxygen saturation were monitored continuously during the procedure. The period of conscious sedation was 46 minutes, of which I was present face-to-face 100% of this time. Estimated blood loss <50 mL. There were no immediate complications during the procedure.    Coronary Findings    Dominance: Left   Left Main  The left main coronary artery was a normal vessel which trifurcated into a large LAD, a small intermediate, and a large dominant artery left circumflex coronary artery.     Left  Anterior Descending  The LAD is a large caliber vessel that gave rise to a very small proximal septal vessel. Beyond this septal was a 40% stenosis followed by 99/100% stenosis with thrombus. There was TIMI 1 flow beyond the stenosis.   . Prox LAD-1 lesion, 40% stenosed.   Marland Kitchen PCI: The pre-interventional distal flow is decreased (TIMI 1). Pre-stent angioplasty was performed. A drug-eluting stent was placed. The strut is apposed. Post-stent angioplasty was performed. The post-interventional distal flow is normal (TIMI 3). The intervention was successful. No complications occurred at this lesion.  . Supplies used: BALLOON EUPHORA KV4.2V95; STENT XIENCE ALPINE GL 8.75I43; BALLOON Alma Thorsby [id number]  . There is no residual stenosis post intervention.     . Prox LAD-2 lesion, 99% stenosed. Thrombotic.   Marland Kitchen PCI: The pre-interventional distal flow is decreased (TIMI 1). Pre-stent angioplasty was performed. A drug-eluting stent was placed. The strut is apposed. Post-stent angioplasty was performed. The post-interventional distal flow is normal (TIMI 3). The intervention was successful. No complications occurred at this lesion.  . Supplies used: BALLOON EUPHORA PI9.5J88; STENT XIENCE ALPINE CZ 6.60Y30; BALLOON  [place] [id number]  . There is no residual stenosis post intervention.       Ramus Intermedius  . Vessel is small. The ramus was a small diminutive size normal vessel   . Lateral Ramus Intermedius   The vessel is small in size.     Left Circumflex  Circumflex was a dominant vessel. The proximal diagonal vessel had smooth 20% proximal narrowing at a site of a proximal branch. The circumflex supplied the PDA and PLA vessel   . Second [employer]   . 2nd Mrg lesion, 20% stenosed.     Right Coronary Artery  The RCA was angiographically normal, nondominant vessel.      Wall Motion                 Left Heart    Left Ventricle The left ventricular size is normal. There is  moderate to severe left ventricular systolic dysfunction. The left ventricular ejection fraction is 25-35% by visual estimate. There are wall motion abnormalities in the left ventricle. Left ventriculography revealed severe hypokinesis involving the mid and distal anterolateral wall, apex and distal inferior apical segment. There was hypokinesis of the basal segments.  Global ejection fraction was 25-30%.    Coronary Diagrams    Diagnostic Diagram           Post-Intervention Diagram            Implants     Permanent Stent   Stent Xience Alpine Rx 3.25x18 [id number] - Implanted    Inventory item: Stent Xience Alpine Rx 3.25x18 Model/Cat no.: [id number]   Serial no.:  Manufacturer: [employer]   Lot no.: [id number] Size:    As of 02/24/2016    Status: Implanted              PACS Images    Show images for Cardiac catheterization     Link to Procedure Log    Procedure Log      Hemo Data       Most Recent Value   AO Systolic Pressure  481 mmHg   AO Diastolic Pressure  65 mmHg   AO Mean  86 mmHg   LV Systolic Pressure  856 mmHg   LV Diastolic Pressure  11 mmHg   LV EDP  26 mmHg   Arterial Occlusion Pressure Extended Systolic Pressure  314 mmHg   Arterial Occlusion Pressure Extended Diastolic Pressure  74 mmHg   Arterial Occlusion Pressure Extended Mean Pressure  92 mmHg   Left Ventricular Apex Extended Systolic Pressure  970 mmHg   Left Ventricular Apex Extended Diastolic Pressure  15 mmHg   Left Ventricular Apex Extended EDP Pressure  28 mmHg      Assessment/Plan:   Active Problems:   Tobacco abuse   ST elevation myocardial infarction (STEMI) of anterior wall, initial episode of care [facility])   STEMI (ST elevation myocardial infarction) ([place])  1. Anterior STEMI; initial pain on 6/23; upon arrival to hospital 6/25 >Trop 81;  S/p PCI to proximal LAD with DES stent; on ASA/brilinta 2. Ischemic cardiomyopathy 2 to AMI:  carvedilol started last night, to start lisinopril today and will add spironolactone 12.5 mg    3. Abnormal LFT's : prob 2 to large MI 4: Leukocytosis: improving prob stress mediated 5. Tobacco abuse: discussed smoking cessation 6. Obesity 7. Mild Hyperlipidemia: now on atorvastatin at 80 mg daily.   Plan: Will start ACE-I and spironolactone. Continue coreg and titrate to 6.25 mg bid;  Continue heparin today. Plan echo tomorrow to make certain no LV thrombus. Will need cardiac rehab.   Troy Sine, MD, [facility] 02/25/2016, 8:09 AM

## 2016-02-25 NOTE — Progress Notes (Signed)
[place] for heparin Indication: chest pain/ACS  Allergies  Allergen Reactions  . Elastic Bandages & [Zinc] Rash  . Oxycodone Anxiety and Other (See Comments)    Patient Measurements: Height: 5\' 2"  (157.5 cm) Weight: 202 lb 13.2 oz (92 kg) IBW/kg (Calculated) : 50.1 Heparin Dosing Weight: 71 kg  Vital Signs: Temp: 98.7 F (37.1 C) (06/26 1628) Temp Source: Oral (06/26 1628) BP: 94/68 mmHg (06/26 1900) Pulse Rate: 85 (06/26 1900)  Labs:  Recent Labs  02/24/16 1258 02/24/16 1754 02/24/16 1800 02/24/16 2122 02/25/16 0319 02/25/16 1159 02/25/16 1955  HGB 15.2  --   --   --  13.6  --   --   HCT 43.6  --   --   --  40.1  --   --   PLT 443*  --   --   --  312  --   --   APTT 26  --   --   --   --   --   --   LABPROT 14.3  --  45.6*  --  15.7*  --   --   INR 1.09  --  4.53*  --  1.23  --   --   HEPARINUNFRC  --   --   --   --   --  <0.10* 0.12*  CREATININE 0.78  --   --   --  0.82  --   --   TROPONINI 81.55* >65.00*  --  >65.00* >65.00*  --   --     Estimated Creatinine Clearance: 82.8 mL/min (by C-G formula based on Cr of 0.82).    Assessment: 55 yo female with STEMI s/p cath with DES to LAD. She remains on heparin post cath with an undetectable level.  Heparin was increased to 1150 units/hr and level remains subtherapeutic.  Confirmed with RN no issue with IV infusion.  No bleeding or complications noted.  Goal of Therapy:  Heparin level 0.3-0.7 units/ml Monitor platelets by anticoagulation protocol: Yes  Plan:  -Increase heparin to 1350 units/hr -Heparin level in 6 hours and daily wth CBC daily  Uvaldo Rising, BCPS  Clinical Pharmacist Pager [phone]  02/25/2016 8:50 PM

## 2016-02-25 NOTE — Progress Notes (Signed)
EKG CRITICAL VALUE     12 lead EKG performed.  Critical value noted.  Gerald Stabs , RN notified.   Yehuda Mao, [place] 02/25/2016 7:40 AM

## 2016-02-25 NOTE — Progress Notes (Signed)
[name] for heparin Indication: chest pain/ACS  Allergies  Allergen Reactions  . Elastic Bandages & [Zinc] Rash    Patient Measurements: Height: 5\' 2"  (157.5 cm) Weight: 202 lb 13.2 oz (92 kg) IBW/kg (Calculated) : 50.1 Heparin Dosing Weight: 71 kg  Vital Signs: Temp: 98.9 F (37.2 C) (06/26 1100) Temp Source: Oral (06/26 1100) BP: 98/57 mmHg (06/26 1200) Pulse Rate: 87 (06/26 1200)  Labs:  Recent Labs  02/24/16 1258 02/24/16 1754 02/24/16 1800 02/24/16 2122 02/25/16 0319 02/25/16 1159  HGB 15.2  --   --   --  13.6  --   HCT 43.6  --   --   --  40.1  --   PLT 443*  --   --   --  312  --   APTT 26  --   --   --   --   --   LABPROT 14.3  --  45.6*  --  15.7*  --   INR 1.09  --  4.53*  --  1.23  --   HEPARINUNFRC  --   --   --   --   --  <0.10*  CREATININE 0.78  --   --   --  0.82  --   TROPONINI 81.55* >65.00*  --  >65.00* >65.00*  --     Estimated Creatinine Clearance: 82.8 mL/min (by C-G formula based on Cr of 0.82).    Assessment: 55 yo female with STEMI s/p cath with DES to LAD. She remains on heparin post cath with an undetectable level   Goal of Therapy:  Heparin level 0.3-0.7 units/ml Monitor platelets by anticoagulation protocol: Yes    Plan:  -Increase heparin to 1150 units/hr -Heparin level in 6 hours and daily wth CBC daily  Hildred Laser, Pharm D 02/25/2016 1:25 PM

## 2016-02-26 ENCOUNTER — Ambulatory Visit (HOSPITAL_COMMUNITY): Payer: BC Managed Care – PPO

## 2016-02-26 DIAGNOSIS — I2589 Other forms of chronic ischemic heart disease: Secondary | ICD-10-CM

## 2016-02-26 LAB — ECHOCARDIOGRAM COMPLETE
AVLVOTPG: 6 mmHg
EERAT: 8.86
EWDT: 183 ms
FS: 19 % — AB (ref 28–44)
HEIGHTINCHES: 62 in
IVS/LV PW RATIO, ED: 1.05
LA ID, A-P, ES: 43 mm
LA diam index: 2.25 cm/m2
LA vol index: 30.8 mL/m2
LA vol: 58.8 mL
LAVOLA4C: 68.1 mL
LDCA: 2.54 cm2
LEFT ATRIUM END SYS DIAM: 43 mm
LV E/e'average: 8.86
LV SIMPSON'S DISK: 42
LV TDI E'LATERAL: 8.7
LV dias vol: 118 mL — AB (ref 46–106)
LV e' LATERAL: 8.7 cm/s
LVDIAVOLIN: 62 mL/m2
LVEEMED: 8.86
LVOT SV: 53 mL
LVOT VTI: 20.7 cm
LVOT diameter: 18 mm
LVOT peak vel: 123 cm/s
LVSYSVOL: 69 mL — AB (ref 14–42)
LVSYSVOLIN: 36 mL/m2
MV Dec: 183
MV pk E vel: 77.1 m/s
MVPG: 2 mmHg
MVPKAVEL: 69.8 m/s
PW: 11.1 mm — AB (ref 0.6–1.1)
RV TAPSE: 24.4 mm
Stroke v: 49 ml
TDI e' medial: 6.31
WEIGHTICAEL: 3185.21 [oz_av]

## 2016-02-26 LAB — CBC
HEMATOCRIT: 36 % (ref 36.0–46.0)
HEMOGLOBIN: 11.9 g/dL — AB (ref 12.0–15.0)
MCH: 32.3 pg (ref 26.0–34.0)
MCHC: 33.1 g/dL (ref 30.0–36.0)
MCV: 97.8 fL (ref 78.0–100.0)
Platelets: 231 10*3/uL (ref 150–400)
RBC: 3.68 MIL/uL — AB (ref 3.87–5.11)
RDW: 13.1 % (ref 11.5–15.5)
WBC: 13.5 10*3/uL — AB (ref 4.0–10.5)

## 2016-02-26 LAB — BASIC METABOLIC PANEL
ANION GAP: 8 (ref 5–15)
BUN: 8 mg/dL (ref 6–20)
CHLORIDE: 100 mmol/L — AB (ref 101–111)
CO2: 26 mmol/L (ref 22–32)
Calcium: 8.7 mg/dL — ABNORMAL LOW (ref 8.9–10.3)
Creatinine, Ser: 0.86 mg/dL (ref 0.44–1.00)
GFR calc non Af Amer: >60 mL/min (ref >60)
GLUCOSE: 146 mg/dL — AB (ref 65–99)
Potassium: 4 mmol/L (ref 3.5–5.1)
SODIUM: 134 mmol/L — AB (ref 135–145)

## 2016-02-26 LAB — HEPATIC FUNCTION PANEL
ALBUMIN: 2.9 g/dL — AB (ref 3.5–5.0)
ALT: 49 U/L (ref 14–54)
AST: 95 U/L — AB (ref 15–41)
Alkaline Phosphatase: 76 U/L (ref 38–126)
BILIRUBIN DIRECT: 0.2 mg/dL (ref 0.1–0.5)
BILIRUBIN TOTAL: 0.8 mg/dL (ref 0.3–1.2)
Indirect Bilirubin: 0.6 mg/dL (ref 0.3–0.9)
Total Protein: 6.1 g/dL — ABNORMAL LOW (ref 6.5–8.1)

## 2016-02-26 LAB — HEPARIN LEVEL (UNFRACTIONATED)
HEPARIN UNFRACTIONATED: 0.3 [IU]/mL (ref 0.30–0.70)
Heparin Unfractionated: 0.27 IU/mL — ABNORMAL LOW (ref 0.30–0.70)

## 2016-02-26 MED ORDER — SODIUM CHLORIDE 0.9 % IV BOLUS (SEPSIS)
250.0000 mL | Freq: Once | INTRAVENOUS | Status: AC
  Administered 2016-02-26: 250 mL via INTRAVENOUS

## 2016-02-26 MED ORDER — LORAZEPAM 0.5 MG PO TABS
0.5000 mg | ORAL_TABLET | Freq: Four times a day (QID) | ORAL | Status: DC | PRN
Start: 2016-02-26 — End: 2016-02-28
  Administered 2016-02-26: 0.5 mg via ORAL
  Filled 2016-02-26: qty 1

## 2016-02-26 MED ORDER — PANTOPRAZOLE SODIUM 40 MG PO TBEC
40.0000 mg | DELAYED_RELEASE_TABLET | Freq: Every day | ORAL | Status: DC
  Administered 2016-02-26 – 2016-02-28 (×3): 40 mg via ORAL
  Filled 2016-02-26 (×3): qty 1

## 2016-02-26 NOTE — Progress Notes (Addendum)
Subjective:  Chest pain resolved; feels better  Objective:   Vital Signs : Filed Vitals:   02/26/16 0400 02/26/16 0500 02/26/16 0600 02/26/16 0700  BP: 97/62 1'00/68 86/60 89/67 '  Pulse: 81 76 77 73  Temp: 98 F (36.7 C)     TempSrc: Oral     Resp: '16 15 19 15  ' Height:      Weight:      SpO2: 91% 94% 95% 94%    Intake/Output from previous day:  Intake/Output Summary (Last 24 hours) at 02/26/16 0757 Last data filed at 02/26/16 0700  Gross per 24 hour  Intake 284.44 ml  Output   1100 ml  Net -815.56 ml    I/O since admission: -2007  Wt Readings from Last 3 Encounters:  02/26/16 199 lb 1.2 oz (90.3 kg)  02/24/16 175 lb (79.379 kg)    Medications: . aspirin EC  81 mg Oral Daily  . atorvastatin  80 mg Oral q1800  . carvedilol  3.125 mg Oral BID WC  . lisinopril  2.5 mg Oral Daily  . sodium chloride flush  3 mL Intravenous Q12H  . spironolactone  12.5 mg Oral Daily  . ticagrelor  90 mg Oral BID    . heparin 1,450 Units/hr (02/26/16 0528)  . nitroGLYCERIN Stopped (02/25/16 2200)    Physical Exam:   General appearance: alert, cooperative, no distress and obese. Neck: no adenopathy, no carotid bruit, no JVD, supple, symmetrical, trachea midline and thyroid not enlarged, symmetric, no tenderness/mass/nodules Lungs: decrease BS at bases Heart: regular rate and rhythm, no rub and faint 1/6 systolic murmur Abdomen: soft, non-tender; bowel sounds normal; no masses,  no organomegaly and liver not enlarged or tender Extremities: no edema, redness or tenderness in the calves or thighs Pulses: 2+ and symmetric; R radial site stable  Skin: Skin color, texture, turgor normal. No rashes or lesions Neurologic: Grossly normal   Rate: 82  Rhythm: normal sinus rhythm   02/26/16 ECG (independently read by me): NSR at 78; QS V1-4 persistent ST elevation with evolving MI T wave inversion.  6/26/17ECG (independently read by me): NSR at 96; Q V1-5 with persistent ST elevation  c/w anterior MI which probably began 2 days PTA  Lab Results:   Recent Labs  02/24/16 1258 02/25/16 0319 02/26/16 0252  NA 129* 133* 134*  K 4.0 4.3 4.0  CL 92* 99* 100*  CO2 '26 22 26  ' GLUCOSE 160* 144* 146*  BUN '11 9 8  ' CREATININE 0.78 0.82 0.86  CALCIUM 9.6 9.2 8.7*    Hepatic Function Latest Ref Rng 02/26/2016 02/25/2016 02/24/2016  Total Protein 6.5 - 8.1 g/dL 6.1(L) 6.8 7.6  Albumin 3.5 - 5.0 g/dL 2.9(L) 3.4(L) 4.2  AST 15 - 41 U/L 95(H) 234(H) 327(H)  ALT 14 - 54 U/L 49 82(H) 87(H)  Alk Phosphatase 38 - 126 U/L 76 80 87  Total Bilirubin 0.3 - 1.2 mg/dL 0.8 1.0 0.9  Bilirubin, Direct 0.1 - 0.5 mg/dL 0.2 - -     Recent Labs  02/24/16 1258 02/25/16 0319 02/26/16 0252  WBC 32.0* 20.9* 13.5*  NEUTROABS 25.6*  --   --   HGB 15.2 13.6 11.9*  HCT 43.6 40.1 36.0  MCV 96.4 95.9 97.8  PLT 443* 312 231     Recent Labs  02/24/16 1754 02/24/16 2122 02/25/16 0319  TROPONINI >65.00* >65.00* >65.00*    Lab Results  Component Value Date   TSH 1.179 02/24/2016    Recent Labs  02/24/16  1800  HGBA1C 5.9*     Recent Labs  02/24/16 1258 02/25/16 0319 02/26/16 0252  PROT 7.6 6.8 6.1*  ALBUMIN 4.2 3.4* 2.9*  AST 327* 234* 95*  ALT 87* 82* 49  ALKPHOS 87 80 76  BILITOT 0.9 1.0 0.8  BILIDIR  --   --  0.2  IBILI  --   --  0.6    Recent Labs  02/25/16 0319  INR 1.23   BNP (last 3 results)  Recent Labs  02/24/16 1800  BNP 584.5*    ProBNP (last 3 results) No results for input(s): PROBNP in the last 8760 hours.   Lipid Panel     Component Value Date/Time   CHOL 170 02/25/2016 0319   TRIG 90 02/25/2016 0319   HDL 50 02/25/2016 0319   CHOLHDL 3.4 02/25/2016 0319   VLDL 18 02/25/2016 0319   LDLCALC 102* 02/25/2016 0319      Imaging:  Dg Chest Port 1 View  02/24/2016  CLINICAL DATA:  Intermittent chest pain for 2 days, shortness of breath EXAM: PORTABLE CHEST 1 VIEW COMPARISON:  None. FINDINGS: Cardiomediastinal silhouette is  unremarkable. No acute infiltrate or pleural effusion. No pulmonary edema. Mild basilar atelectasis. Bony thorax is unremarkable. IMPRESSION: No infiltrate or pulmonary edema.  Mild basilar atelectasis. Electronically Signed   By: Lahoma Crocker M.D.   On: 02/24/2016 13:54    EMERGENT CATH/PCI: 02/24/16  Conclusion     2nd Mrg lesion, 20% stenosed.  Prox LAD-1 lesion, 40% stenosed.  Prox LAD-2 lesion, 99% stenosed. Post intervention, there is a 0% residual stenosis.  There is moderate to severe left ventricular systolic dysfunction.   Acute LV dysfunction with severe hypokinesis involving the mid and distal anterolateral wall apex and distal inferoapical segment with a global ejection fraction at 25-30%. LVEDP was elevated at 26 mm Hg.  Subtotal 99-100% stenosis in the proximal LAD with thrombus and TIMI 1 flow.  Mild 20% narrowing in the marginal branch of a dominant left circumflex coronary artery.  Normal nondominant RCA.  Successful PCI to the proximal LAD with ultimate stenting with a 3.2518 mm Xience Alpine DES stent postdilated to 3.5 mm with the 99% stenosis being reduced to 0% and resumption of brisk TIMI 3 flow.  RECOMMENDATION: The patient will require dual antiplatelet therapy for minimum of a year. With her ischemic cardiomyopathy she will be started on low-dose carvedilol and ACE inhibition with plans for dose titration. Aggressive lipid-lowering therapy will be instituted. Smoking cessation is imperative.     Indications    ST elevation myocardial infarction involving left anterior descending (LAD) coronary artery (HCC) [I21.02 (ICD-10-CM)]    Technique and Indications    Erika Rios is a 55 year old female who has a > 30 year history of tobacco use. She has not seen a doctor in over 20 years. Two days ago she began to experience substernal chest pressure radiating to her back. Her chest pain persisted throughout the weekend. Today she presented to [facility] emergency room. Her ECG revealed ST segment elevation anterior MI. A troponin level was sent which came back 81.55. A code STEMI was activated; she was given heparin 5000 units and was transported to [facility] for emergent cardiac catheterization.  Upon arrival to the catheterization laboratory the patient's chest pain had improved but she still had mild discomfort The patient was premedicated with Versed 2 mg and fentanyl 50 mcg. A right radial approach was utilized after an Allen's test verified adequate circulation. The  right radial artery was punctured via the Seldinger technique, and a 6 [place] Glidesheath Slender was inserted without difficulty. A radial cocktail consisting of Verapamil, IV nitroglycerin, and lidocaine was administered. Weight adjusted heparin was administered. A safety J wire was advanced into the ascending aorta. Diagnostic catheterization was done with a 5 [place] JR4, and a 6 [place] XB LAD 3.5 guide catheter. With the demonstration of a 99/100% stenosis in the proximal LAD with TIMI 1 flow the decision was made to proceed with PCI. Angiomax bolus plus infusion was administered. The patient received Brilinta 180 mg during the interventional procedure. A Choice PT moderate support wire was able to cross the subtotal occlusion. Predilatation was done with a 2.512 mm balloon. A 3.2518 mm Xience Alpine DES stent was then inserted which covered both the 40% and 99% stenosis. This was deployed up at 14 and 15 atm. Marland Kitchen Post stent dilatation was done with a 3.512 mm noncompliant balloon. A 5 French pigtail catheter was used for left ventriculography. A TR radial band was applied for hemostasis. The patient left the catheterization laboratory chest pain-free with stable hemodynamics. During this procedure the patient was administered a total of Versed 2 mg and Fentanyl 50 mg to achieve and maintain moderate conscious sedation. The patient's heart rate, blood pressure, and oxygen  saturation were monitored continuously during the procedure. The period of conscious sedation was 46 minutes, of which I was present face-to-face 100% of this time. Estimated blood loss <50 mL. There were no immediate complications during the procedure.    Coronary Findings    Dominance: Left   Left Main  The left main coronary artery was a normal vessel which trifurcated into a large LAD, a small intermediate, and a large dominant artery left circumflex coronary artery.     Left Anterior Descending  The LAD is a large caliber vessel that gave rise to a very small proximal septal vessel. Beyond this septal was a 40% stenosis followed by 99/100% stenosis with thrombus. There was TIMI 1 flow beyond the stenosis.   . Prox LAD-1 lesion, 40% stenosed.   Marland Kitchen PCI: The pre-interventional distal flow is decreased (TIMI 1). Pre-stent angioplasty was performed. A drug-eluting stent was placed. The strut is apposed. Post-stent angioplasty was performed. The post-interventional distal flow is normal (TIMI 3). The intervention was successful. No complications occurred at this lesion.  . Supplies used: BALLOON EUPHORA UR4.2H06; STENT XIENCE ALPINE CB 7.62G31; BALLOON [place] [id number]  . There is no residual stenosis post intervention.     . Prox LAD-2 lesion, 99% stenosed. Thrombotic.   Marland Kitchen PCI: The pre-interventional distal flow is decreased (TIMI 1). Pre-stent angioplasty was performed. A drug-eluting stent was placed. The strut is apposed. Post-stent angioplasty was performed. The post-interventional distal flow is normal (TIMI 3). The intervention was successful. No complications occurred at this lesion.  . Supplies used: BALLOON EUPHORA DV7.6H60; STENT XIENCE ALPINE VP 7.10G26; BALLOON [place] [id number]  . There is no residual stenosis post intervention.       Ramus Intermedius  . Vessel is small. The ramus was a small diminutive size normal vessel   . Lateral Ramus Intermedius   The vessel is  small in size.     Left Circumflex  Circumflex was a dominant vessel. The proximal diagonal vessel had smooth 20% proximal narrowing at a site of a proximal branch. The circumflex supplied the PDA and PLA vessel   . Second [employer]   . 2nd Mrg lesion,  20% stenosed.     Right Coronary Artery  The RCA was angiographically normal, nondominant vessel.      Wall Motion                 Left Heart    Left Ventricle The left ventricular size is normal. There is moderate to severe left ventricular systolic dysfunction. The left ventricular ejection fraction is 25-35% by visual estimate. There are wall motion abnormalities in the left ventricle. Left ventriculography revealed severe hypokinesis involving the mid and distal anterolateral wall, apex and distal inferior apical segment. There was hypokinesis of the basal segments. Global ejection fraction was 25-30%.    Coronary Diagrams    Diagnostic Diagram           Post-Intervention Diagram            Implants     Permanent Stent   Stent Xience Alpine Rx 3.25x18 [phone] - Implanted    Inventory item: Stent Xience Alpine Rx 3.25x18 Model/Cat no.: [id number]   Serial no.:  Manufacturer: [employer]   Lot no.: [id number] Size:    As of 02/24/2016    Status: Implanted              PACS Images    Show images for Cardiac catheterization     Link to Procedure Log    Procedure Log      Hemo Data       Most Recent Value   AO Systolic Pressure  315 mmHg   AO Diastolic Pressure  65 mmHg   AO Mean  86 mmHg   LV Systolic Pressure  176 mmHg   LV Diastolic Pressure  11 mmHg   LV EDP  26 mmHg   Arterial Occlusion Pressure Extended Systolic Pressure  160 mmHg   Arterial Occlusion Pressure Extended Diastolic Pressure  74 mmHg   Arterial Occlusion Pressure Extended Mean Pressure  92 mmHg   Left Ventricular Apex Extended Systolic Pressure  737 mmHg   Left  Ventricular Apex Extended Diastolic Pressure  15 mmHg   Left Ventricular Apex Extended EDP Pressure  28 mmHg      Assessment/Plan:   Active Problems:   Tobacco abuse   ST elevation myocardial infarction (STEMI) of anterior wall, initial episode of care (HCC)   STEMI (ST elevation myocardial infarction) (HCC)   Cardiomyopathy, ischemic  1. Anterior STEMI; initial pain on 6/23; upon arrival to hospital 6/25 >Trop 81;  S/p PCI to proximal LAD with DES stent; on ASA/brilinta; no recurrent chest pain; evolving ECG changes on ECG. 2. Ischemic cardiomyopathy 2 to AMI: now on carvedilol, lisinopril, and spironolactone 12.5 mg    3. Abnormal LFT's : prob 2 to large MI; significantly improving 4: Leukocytosis: improving prob stress mediated; markedly improved 5. Tobacco abuse: discussed smoking cessation 6. Obesity 7. Mild Hyperlipidemia: now on atorvastatin at 80 mg daily.  8. Anxiety: 9. Stomach ache:  Will add protonix  Plan: BP in the 90 - 100;  I had a long discussion with pt and husband; reviewed history, medications, risks and potential future plans. Obtain  echo today to make certain no LV thrombus; still early to assess for LV recovery, but anticipate significant residual scar. Continue heparin today; prob dc if no apical thrombus. Will need cardiac rehab. Good diuresis. No overt CHF on exam today. F/U BNP in am. Add protonix.  Time spent 40 minutes.  Troy Sine, MD, [facility] 02/26/2016, 7:57 AM

## 2016-02-26 NOTE — Progress Notes (Signed)
  Echocardiogram 2D Echocardiogram has been performed.  Erika Rios 02/26/2016, 4:44 PM

## 2016-02-26 NOTE — Progress Notes (Signed)
[place] for heparin Indication: chest pain/ACS  Allergies  Allergen Reactions  . Elastic Bandages & [Zinc] Rash  . Oxycodone Anxiety and Other (See Comments)    Patient Measurements: Height: 5\' 2"  (157.5 cm) Weight: 199 lb 1.2 oz (90.3 kg) IBW/kg (Calculated) : 50.1 Heparin Dosing Weight: 71 kg  Vital Signs: Temp: 98.5 F (36.9 C) (06/27 1136) Temp Source: Oral (06/27 1136) BP: 87/68 mmHg (06/27 1200) Pulse Rate: 73 (06/27 1200)  Labs:  Recent Labs  02/24/16 1258 02/24/16 1754 02/24/16 1800 02/24/16 2122 02/25/16 0319  02/25/16 1955 02/26/16 0252 02/26/16 1237  HGB 15.2  --   --   --  13.6  --   --  11.9*  --   HCT 43.6  --   --   --  40.1  --   --  36.0  --   PLT 443*  --   --   --  312  --   --  231  --   APTT 26  --   --   --   --   --   --   --   --   LABPROT 14.3  --  45.6*  --  15.7*  --   --   --   --   INR 1.09  --  4.53*  --  1.23  --   --   --   --   HEPARINUNFRC  --   --   --   --   --   < > 0.12* 0.27* 0.30  CREATININE 0.78  --   --   --  0.82  --   --  0.86  --   TROPONINI 81.55* >65.00*  --  >65.00* >65.00*  --   --   --   --   < > = values in this interval not displayed.  Estimated Creatinine Clearance: 78.2 mL/min (by C-G formula based on Cr of 0.86).    Assessment: 55 yo female with STEMI s/p cath with DES to LAD. She remains on heparin post cath awaiting an ECHO to r/o cardiac thrombus -Heparin level = 0.3 and at goal    Goal of Therapy:  Heparin level 0.3-0.7 units/ml Monitor platelets by anticoagulation protocol: Yes    Plan:  -Increase heparin to 1550 units/hr to keep in goal -Heparin level in 6 hours and daily wth CBC daily  Hildred Laser, Pharm D 02/26/2016 2:19 PM

## 2016-02-26 NOTE — Progress Notes (Signed)
ANTICOAGULATION CONSULT NOTE - Follow Up Consult  Pharmacy Consult for Heparin Indication: chest pain/ACS, s/p cath  Allergies  Allergen Reactions  . Elastic Bandages & [Zinc] Rash  . Oxycodone Anxiety and Other (See Comments)    Patient Measurements: Height: 5\' 2"  (157.5 cm) Weight: 199 lb 1.2 oz (90.3 kg) IBW/kg (Calculated) : 50.1  Vital Signs: Temp: 98.2 F (36.8 C) (06/27 0000) Temp Source: Oral (06/27 0000) BP: 89/61 mmHg (06/27 0100) Pulse Rate: 73 (06/27 0100)  Labs:  Recent Labs  02/24/16 1258 02/24/16 1754 02/24/16 1800 02/24/16 2122 02/25/16 0319 02/25/16 1159 02/25/16 1955 02/26/16 0252  HGB 15.2  --   --   --  13.6  --   --  11.9*  HCT 43.6  --   --   --  40.1  --   --  36.0  PLT 443*  --   --   --  312  --   --  231  APTT 26  --   --   --   --   --   --   --   LABPROT 14.3  --  45.6*  --  15.7*  --   --   --   INR 1.09  --  4.53*  --  1.23  --   --   --   HEPARINUNFRC  --   --   --   --   --  <0.10* 0.12* 0.27*  CREATININE 0.78  --   --   --  0.82  --   --  0.86  TROPONINI 81.55* >65.00*  --  >65.00* >65.00*  --   --   --     Estimated Creatinine Clearance: 78.2 mL/min (by C-G formula based on Cr of 0.86).  Assessment: Heparin s/p cath, checking ECHO to ensure no LV thrombus, heparin level is trending up but remains sub-therapeutic, no issues per RN.   Goal of Therapy:  Heparin level 0.3-0.7 units/ml Monitor platelets by anticoagulation protocol: Yes   Plan:  -Inc heparin to 1450 units/hr -1200 HL  Narda Bonds 02/26/2016,4:04 AM

## 2016-02-26 NOTE — Progress Notes (Addendum)
Pt trending down BP. Last 82/57 when rechecked on R arm. Pt beginning to experience L jaw pain 5/10. MD notified and questioned fluid bolus and EKG. MD ordered 270mL bolus NS, trop to be drawn at 0100, and EKG.

## 2016-02-26 NOTE — Progress Notes (Signed)
EKG CRITICAL VALUE     12 lead EKG performed.  Critical value noted. Christopher Mitchell Heir, Anne-Marie Genson H, CCT 02/26/2016 6:50 AM

## 2016-02-26 NOTE — Progress Notes (Signed)
Pt voided. Slight smear of blood on toilet paper.

## 2016-02-26 NOTE — Progress Notes (Signed)
MD notified of persisting pt SBP 70's for 1 hour during 287mL NS bolus. MD ordered another 240mL bolus to follow. Goal MAP > 60.

## 2016-02-27 DIAGNOSIS — I952 Hypotension due to drugs: Secondary | ICD-10-CM

## 2016-02-27 LAB — HEPARIN LEVEL (UNFRACTIONATED): Heparin Unfractionated: 0.59 IU/mL (ref 0.30–0.70)

## 2016-02-27 LAB — BASIC METABOLIC PANEL
ANION GAP: 11 (ref 5–15)
BUN: 11 mg/dL (ref 6–20)
CHLORIDE: 104 mmol/L (ref 101–111)
CO2: 20 mmol/L — ABNORMAL LOW (ref 22–32)
Calcium: 8.3 mg/dL — ABNORMAL LOW (ref 8.9–10.3)
Creatinine, Ser: 0.88 mg/dL (ref 0.44–1.00)
GFR calc Af Amer: >60 mL/min (ref >60)
GFR calc non Af Amer: >60 mL/min (ref >60)
Glucose, Bld: 110 mg/dL — ABNORMAL HIGH (ref 65–99)
POTASSIUM: 3.9 mmol/L (ref 3.5–5.1)
SODIUM: 135 mmol/L (ref 135–145)

## 2016-02-27 LAB — CBC
HEMATOCRIT: 32.5 % — AB (ref 36.0–46.0)
HEMOGLOBIN: 11.1 g/dL — AB (ref 12.0–15.0)
MCH: 33.2 pg (ref 26.0–34.0)
MCHC: 34.2 g/dL (ref 30.0–36.0)
MCV: 97.3 fL (ref 78.0–100.0)
Platelets: 240 10*3/uL (ref 150–400)
RBC: 3.34 MIL/uL — ABNORMAL LOW (ref 3.87–5.11)
RDW: 13.2 % (ref 11.5–15.5)
WBC: 11.8 10*3/uL — AB (ref 4.0–10.5)

## 2016-02-27 LAB — BRAIN NATRIURETIC PEPTIDE: B Natriuretic Peptide: 536.9 pg/mL — ABNORMAL HIGH (ref 0.0–100.0)

## 2016-02-27 MED ORDER — DOPAMINE-DEXTROSE 3.2-5 MG/ML-% IV SOLN
5.0000 ug/kg/min | INTRAVENOUS | Status: DC
  Filled 2016-02-27: qty 250

## 2016-02-27 MED ORDER — CARVEDILOL 3.125 MG PO TABS
1.6600 mg | ORAL_TABLET | Freq: Two times a day (BID) | ORAL | Status: DC
  Filled 2016-02-27: qty 1

## 2016-02-27 NOTE — Progress Notes (Signed)
Subjective:  Day 3 s/p STEMI/PCI; Chest pain resolved; feels better; hypotensive during night after coreg dose. L neck ache.  Objective:   Vital Signs : Filed Vitals:   02/27/16 0500 02/27/16 0600 02/27/16 0700 02/27/16 0752  BP: '76/51 75/45 79/52 '   Pulse: 70 66 60   Temp:    98 F (36.7 C)  TempSrc:    Oral  Resp: '17 18 15   ' Height:      Weight:      SpO2: 95% 99% 94%     Intake/Output from previous day:  Intake/Output Summary (Last 24 hours) at 02/27/16 0806 Last data filed at 02/27/16 0600  Gross per 24 hour  Intake    995 ml  Output    400 ml  Net    595 ml    I/O since admission: -1707  Wt Readings from Last 3 Encounters:  02/27/16 200 lb 2.8 oz (90.8 kg)  02/24/16 175 lb (79.379 kg)    Medications: . aspirin EC  81 mg Oral Daily  . atorvastatin  80 mg Oral q1800  . carvedilol  3.125 mg Oral BID WC  . lisinopril  2.5 mg Oral Daily  . pantoprazole  40 mg Oral Q0600  . sodium chloride flush  3 mL Intravenous Q12H  . spironolactone  12.5 mg Oral Daily  . ticagrelor  90 mg Oral BID    . DOPamine    . heparin 1,550 Units/hr (02/26/16 2000)  . nitroGLYCERIN Stopped (02/25/16 2200)    Physical Exam:   General appearance: alert, cooperative, no distress and obese. Neck: no adenopathy, no carotid bruit, no JVD, supple, symmetrical, trachea midline and thyroid not enlarged, symmetric, no tenderness/mass/nodules Lungs: decrease BS at bases; no rales Heart: regular rate and rhythm, no rub and faint 1/6 systolic murmur Abdomen: soft, non-tender; bowel sounds normal; no masses,  no organomegaly and liver not enlarged or tender Extremities: no edema, redness or tenderness in the calves or thighs Pulses: 2+ and symmetric; R radial site stable  Skin: Skin color, texture, turgor normal. No rashes or lesions Neurologic: Grossly normal   Rate: 69  Rhythm: normal sinus rhythm   02/27/16 ECG (independently read by me): NSR at 66; ST elevation essentially resolved;  T wave inversion   02/26/16 ECG (independently read by me): NSR at 78; QS V1-4 persistent ST elevation with evolving MI T wave inversion.  6/26/17ECG (independently read by me): NSR at 96; Q V1-5 with persistent ST elevation c/w anterior MI which probably began 2 days PTA  Lab Results:   Recent Labs  02/25/16 0319 02/26/16 0252 02/27/16 0557  NA 133* 134* 135  K 4.3 4.0 3.9  CL 99* 100* 104  CO2 22 26 20*  GLUCOSE 144* 146* 110*  BUN '9 8 11  ' CREATININE 0.82 0.86 0.88  CALCIUM 9.2 8.7* 8.3*    Hepatic Function Latest Ref Rng 02/26/2016 02/25/2016 02/24/2016  Total Protein 6.5 - 8.1 g/dL 6.1(L) 6.8 7.6  Albumin 3.5 - 5.0 g/dL 2.9(L) 3.4(L) 4.2  AST 15 - 41 U/L 95(H) 234(H) 327(H)  ALT 14 - 54 U/L 49 82(H) 87(H)  Alk Phosphatase 38 - 126 U/L 76 80 87  Total Bilirubin 0.3 - 1.2 mg/dL 0.8 1.0 0.9  Bilirubin, Direct 0.1 - 0.5 mg/dL 0.2 - -     Recent Labs  02/24/16 1258 02/25/16 0319 02/26/16 0252 02/27/16 0557  WBC 32.0* 20.9* 13.5* 11.8*  NEUTROABS 25.6*  --   --   --  HGB 15.2 13.6 11.9* 11.1*  HCT 43.6 40.1 36.0 32.5*  MCV 96.4 95.9 97.8 97.3  PLT 443* 312 231 240     Recent Labs  02/24/16 1754 02/24/16 2122 02/25/16 0319  TROPONINI >65.00* >65.00* >65.00*    Lab Results  Component Value Date   TSH 1.179 02/24/2016    Recent Labs  02/24/16 1800  HGBA1C 5.9*     Recent Labs  02/24/16 1258 02/25/16 0319 02/26/16 0252  PROT 7.6 6.8 6.1*  ALBUMIN 4.2 3.4* 2.9*  AST 327* 234* 95*  ALT 87* 82* 49  ALKPHOS 87 80 76  BILITOT 0.9 1.0 0.8  BILIDIR  --   --  0.2  IBILI  --   --  0.6    Recent Labs  02/25/16 0319  INR 1.23   BNP (last 3 results)  Recent Labs  02/24/16 1800  BNP 584.5*    BNP today 6/28  536   ProBNP (last 3 results) No results for input(s): PROBNP in the last 8760 hours.   Lipid Panel     Component Value Date/Time   CHOL 170 02/25/2016 0319   TRIG 90 02/25/2016 0319   HDL 50 02/25/2016 0319   CHOLHDL 3.4  02/25/2016 0319   VLDL 18 02/25/2016 0319   LDLCALC 102* 02/25/2016 0319      Imaging:  No results found.  EMERGENT CATH/PCI: 02/24/16  Conclusion     2nd Mrg lesion, 20% stenosed.  Prox LAD-1 lesion, 40% stenosed.  Prox LAD-2 lesion, 99% stenosed. Post intervention, there is a 0% residual stenosis.  There is moderate to severe left ventricular systolic dysfunction.   Acute LV dysfunction with severe hypokinesis involving the mid and distal anterolateral wall apex and distal inferoapical segment with a global ejection fraction at 25-30%. LVEDP was elevated at 26 mm Hg.  Subtotal 99-100% stenosis in the proximal LAD with thrombus and TIMI 1 flow.  Mild 20% narrowing in the marginal branch of a dominant left circumflex coronary artery.  Normal nondominant RCA.  Successful PCI to the proximal LAD with ultimate stenting with a 3.2518 mm Xience Alpine DES stent postdilated to 3.5 mm with the 99% stenosis being reduced to 0% and resumption of brisk TIMI 3 flow.  RECOMMENDATION: The patient will require dual antiplatelet therapy for minimum of a year. With her ischemic cardiomyopathy she will be started on low-dose carvedilol and ACE inhibition with plans for dose titration. Aggressive lipid-lowering therapy will be instituted. Smoking cessation is imperative.     Indications    ST elevation myocardial infarction involving left anterior descending (LAD) coronary artery (HCC) [I21.02 (ICD-10-CM)]    Technique and Indications    Ms.Santasia Rew is a 55 year old female who has a > 30 year history of tobacco use. She has not seen a doctor in over 20 years. Two days ago she began to experience substernal chest pressure radiating to her back. Her chest pain persisted throughout the weekend. Today she presented to [facility] emergency room. Her ECG revealed ST segment elevation anterior MI. A troponin level was sent which came back 81.55. A code STEMI was activated; she  was given heparin 5000 units and was transported to [facility] for emergent cardiac catheterization.  Upon arrival to the catheterization laboratory the patient's chest pain had improved but she still had mild discomfort The patient was premedicated with Versed 2 mg and fentanyl 50 mcg. A right radial approach was utilized after an Allen's test verified adequate circulation. The right radial artery  was punctured via the Seldinger technique, and a 6 [place] Glidesheath Slender was inserted without difficulty. A radial cocktail consisting of Verapamil, IV nitroglycerin, and lidocaine was administered. Weight adjusted heparin was administered. A safety J wire was advanced into the ascending aorta. Diagnostic catheterization was done with a 5 [place] JR4, and a 6 [place] XB LAD 3.5 guide catheter. With the demonstration of a 99/100% stenosis in the proximal LAD with TIMI 1 flow the decision was made to proceed with PCI. Angiomax bolus plus infusion was administered. The patient received Brilinta 180 mg during the interventional procedure. A Choice PT moderate support wire was able to cross the subtotal occlusion. Predilatation was done with a 2.512 mm balloon. A 3.2518 mm Xience Alpine DES stent was then inserted which covered both the 40% and 99% stenosis. This was deployed up at 14 and 15 atm. Marland Kitchen Post stent dilatation was done with a 3.512 mm noncompliant balloon. A 5 French pigtail catheter was used for left ventriculography. A TR radial band was applied for hemostasis. The patient left the catheterization laboratory chest pain-free with stable hemodynamics. During this procedure the patient was administered a total of Versed 2 mg and Fentanyl 50 mg to achieve and maintain moderate conscious sedation. The patient's heart rate, blood pressure, and oxygen saturation were monitored continuously during the procedure. The period of conscious sedation was 46 minutes, of which I was present face-to-face 100% of this  time. Estimated blood loss <50 mL. There were no immediate complications during the procedure.    Coronary Findings    Dominance: Left   Left Main  The left main coronary artery was a normal vessel which trifurcated into a large LAD, a small intermediate, and a large dominant artery left circumflex coronary artery.     Left Anterior Descending  The LAD is a large caliber vessel that gave rise to a very small proximal septal vessel. Beyond this septal was a 40% stenosis followed by 99/100% stenosis with thrombus. There was TIMI 1 flow beyond the stenosis.   . Prox LAD-1 lesion, 40% stenosed.   Marland Kitchen PCI: The pre-interventional distal flow is decreased (TIMI 1). Pre-stent angioplasty was performed. A drug-eluting stent was placed. The strut is apposed. Post-stent angioplasty was performed. The post-interventional distal flow is normal (TIMI 3). The intervention was successful. No complications occurred at this lesion.  . Supplies used: BALLOON EUPHORA YE2.3V61; STENT XIENCE ALPINE QA 4.49P53; BALLOON [place] [id number]  . There is no residual stenosis post intervention.     . Prox LAD-2 lesion, 99% stenosed. Thrombotic.   Marland Kitchen PCI: The pre-interventional distal flow is decreased (TIMI 1). Pre-stent angioplasty was performed. A drug-eluting stent was placed. The strut is apposed. Post-stent angioplasty was performed. The post-interventional distal flow is normal (TIMI 3). The intervention was successful. No complications occurred at this lesion.  . Supplies used: BALLOON EUPHORA YY5.1T02; STENT XIENCE ALPINE TR 1.73V67; BALLOON [place] [id number]  . There is no residual stenosis post intervention.       Ramus Intermedius  . Vessel is small. The ramus was a small diminutive size normal vessel   . Lateral Ramus Intermedius   The vessel is small in size.     Left Circumflex  Circumflex was a dominant vessel. The proximal diagonal vessel had smooth 20% proximal narrowing at a site of a proximal  branch. The circumflex supplied the PDA and PLA vessel   . Second [employer]   . 2nd Mrg lesion, 20% stenosed.  Right Coronary Artery  The RCA was angiographically normal, nondominant vessel.      Wall Motion                 Left Heart    Left Ventricle The left ventricular size is normal. There is moderate to severe left ventricular systolic dysfunction. The left ventricular ejection fraction is 25-35% by visual estimate. There are wall motion abnormalities in the left ventricle. Left ventriculography revealed severe hypokinesis involving the mid and distal anterolateral wall, apex and distal inferior apical segment. There was hypokinesis of the basal segments. Global ejection fraction was 25-30%.    Coronary Diagrams    Diagnostic Diagram           Post-Intervention Diagram            Implants     Permanent Stent   Stent Xience Alpine Rx 3.25x18 [phone] - Implanted    Inventory item: Stent Xience Alpine Rx 3.25x18 Model/Cat no.: [id number]   Serial no.:  Manufacturer: [employer]   Lot no.: [id number] Size:    As of 02/24/2016    Status: Implanted              PACS Images    Show images for Cardiac catheterization     Link to Procedure Log    Procedure Log      Hemo Data       Most Recent Value   AO Systolic Pressure  128 mmHg   AO Diastolic Pressure  65 mmHg   AO Mean  86 mmHg   LV Systolic Pressure  118 mmHg   LV Diastolic Pressure  11 mmHg   LV EDP  26 mmHg   Arterial Occlusion Pressure Extended Systolic Pressure  867 mmHg   Arterial Occlusion Pressure Extended Diastolic Pressure  74 mmHg   Arterial Occlusion Pressure Extended Mean Pressure  92 mmHg   Left Ventricular Apex Extended Systolic Pressure  737 mmHg   Left Ventricular Apex Extended Diastolic Pressure  15 mmHg   Left Ventricular Apex Extended EDP Pressure  28 mmHg     ------------------------------------------------------------------- ECHO Study Conclusions  - Left ventricle: The cavity size was normal. Wall thickness was  increased in a pattern of mild LVH. 22  lic function was  moderately reduced. The estimated ejection fraction was in the  range of 35% to 40%. Mid to distal anterior, apical and  inferoapical akinesis suggestive of LAD territory  ischemia/infarct. Doppler parameters are consistent with abnormal  left ventricular relaxation (grade 1 diastolic dysfunction). The  E/e&' ratio is between 8-15, suggesting indeterminate LV filling  pressure. - Mitral valve: Mildly thickened leaflets . There was trivial  regurgitation. - Left atrium: Mildly dilated. - Tricuspid valve: There was no significant regurgitation. - Inferior vena cava: The vessel was dilated. The respirophasic  diameter changes were blunted (< 50%), consistent with elevated  central venous pressure.  Impressions:  - LVEF 35-40%, anterior, apical and inferoapical akinesis  suggestive of LAD territory ischemia/infarct, mild LVH, diastolic  dysfunction, indeterminate LV filling pressure, trivial MR, mild  LAE, dilated IVC.   Assessment/Plan:   Active Problems:   Tobacco abuse   ST elevation myocardial infarction (STEMI) of anterior wall, initial episode of care [facility])   STEMI (ST elevation myocardial infarction) ([place])   Cardiomyopathy, ischemic  1. Anterior STEMI; initial pain on 6/23; upon arrival to hospital 6/25 >Trop 81; Day 3 S/p PCI to proximal LAD with DES stent; on ASA/brilinta; no recurrent chest  pain; evolving ECG changes on ECG with QS V1-5; ST elevation has essentially resolved today. 2. Ischemic cardiomyopathy 2 to AMI: now on carvedilol, lisinopril, and spironolactone 12.5 mg. Acute EF 25%; echo yesterday shows EF slightly improved now 35 - 40%.  No apical thrombus - will dc heparin.    3. Abnormal LFT's : prob 2 to large MI;  significantly improving 4: Leukocytosis: improving prob stress mediated; now resolved 5. Tobacco abuse: discussed smoking cessation 6. Obesity 7. Mild Hyperlipidemia: now on atorvastatin at 80 mg daily.  8. Anxiety: 9. Stomach ache:  Better onprotonix  BP dropped following coreg dose. Pulse now in the 60's.; BP now 88/65. ASX. Dopamine was ordered by fellow, but not started.  Will not start presently.  Will decreased coreg to 1/2 of 3.125 mg bid. BNP today 536. Will low BP keep in CCU today. Up to chair, and begin to ambulate if tolerates. L neck/shoulder ache is probably musculoskeletal. Cardiac Rehab.  Time spent 35 minutes.  Troy Sine, MD, [facility] 02/27/2016, 8:06 AM

## 2016-02-27 NOTE — Progress Notes (Signed)
[facility] for heparin Indication: chest pain/ACS  Allergies  Allergen Reactions  . Elastic Bandages & [Zinc] Rash  . Oxycodone Anxiety and Other (See Comments)    Patient Measurements: Height: 5\' 2"  (157.5 cm) Weight: 200 lb 2.8 oz (90.8 kg) IBW/kg (Calculated) : 50.1 Heparin Dosing Weight: 71 kg  Vital Signs: Temp: 98 F (36.7 C) (06/28 0752) Temp Source: Oral (06/28 0752) BP: 79/52 mmHg (06/28 0700) Pulse Rate: 60 (06/28 0700)  Labs:  Recent Labs  02/24/16 1258 02/24/16 1754 02/24/16 1800 02/24/16 2122 02/25/16 0319  02/26/16 0252 02/26/16 1237 02/27/16 0557  HGB 15.2  --   --   --  13.6  --  11.9*  --  11.1*  HCT 43.6  --   --   --  40.1  --  36.0  --  32.5*  PLT 443*  --   --   --  312  --  231  --  240  APTT 26  --   --   --   --   --   --   --   --   LABPROT 14.3  --  45.6*  --  15.7*  --   --   --   --   INR 1.09  --  4.53*  --  1.23  --   --   --   --   HEPARINUNFRC  --   --   --   --   --   < > 0.27* 0.30 0.59  CREATININE 0.78  --   --   --  0.82  --  0.86  --  0.88  TROPONINI 81.55* >65.00*  --  >65.00* >65.00*  --   --   --   --   < > = values in this interval not displayed.  Estimated Creatinine Clearance: 76.6 mL/min (by C-G formula based on Cr of 0.88).    Assessment: 55 yo female with STEMI s/p cath with DES to LAD. She remains on heparin post cath now s/p ECHO to r/o cardiac thrombus. -Heparin level = 0.59 and at goal    Goal of Therapy:  Heparin level 0.3-0.7 units/ml Monitor platelets by anticoagulation protocol: Yes    Plan:  -No heparin changes needed -Heparin daily wth CBC daily -Will follow heparin plans  Hildred Laser, Pharm D 02/27/2016 8:15 AM

## 2016-02-27 NOTE — Progress Notes (Signed)
CARDIAC REHAB PHASE I   PRE:  Rate/Rhythm: 72 SR  BP:  Sitting: 95/66        SaO2: 98 RA  MODE:  Ambulation: 350 ft   POST:  Rate/Rhythm: 83 SR  BP:  Sitting: 114/83         SaO2: 97 RA  Pt ambulated 350 ft on RA, IV, handheld assist, steady gait, tolerated well, no complaints. Began MI/stent education with pt and sister at bedside. Reviewed risk factors, tobacco cessation (gave pt fake cigarette), MI book, anti-platelet therapy, stent card, activity restrictions, CHF booklet and zone tool, daily weights, sodium restrictions and phase 2 cardiac rehab. Left heart healthy and low sodium diet handouts for pt to review. Pt verbalized understanding, receptive to education. Pt agrees to phase 2 cardiac rehab referral, will send to [facility] per pt request. Will plan to review exercise, ntg use, and diet education tomorrow. Pt to bed per pt request after walk, call bell within reach. Will follow.    [phone] Lenna Sciara, RN, BSN 02/27/2016 11:29 AM

## 2016-02-27 NOTE — Progress Notes (Signed)
Pt MAP < 60. MD notified and order for dopamine gtt placed.

## 2016-02-28 DIAGNOSIS — Z955 Presence of coronary angioplasty implant and graft: Secondary | ICD-10-CM

## 2016-02-28 LAB — BASIC METABOLIC PANEL
ANION GAP: 7 (ref 5–15)
BUN: 7 mg/dL (ref 6–20)
CALCIUM: 8.5 mg/dL — AB (ref 8.9–10.3)
CO2: 23 mmol/L (ref 22–32)
Chloride: 106 mmol/L (ref 101–111)
Creatinine, Ser: 0.77 mg/dL (ref 0.44–1.00)
GFR calc Af Amer: >60 mL/min (ref >60)
Glucose, Bld: 101 mg/dL — ABNORMAL HIGH (ref 65–99)
POTASSIUM: 3.6 mmol/L (ref 3.5–5.1)
SODIUM: 136 mmol/L (ref 135–145)

## 2016-02-28 LAB — CBC
HEMATOCRIT: 33 % — AB (ref 36.0–46.0)
HEMOGLOBIN: 10.9 g/dL — AB (ref 12.0–15.0)
MCH: 32.6 pg (ref 26.0–34.0)
MCHC: 33 g/dL (ref 30.0–36.0)
MCV: 98.8 fL (ref 78.0–100.0)
Platelets: 249 10*3/uL (ref 150–400)
RBC: 3.34 MIL/uL — AB (ref 3.87–5.11)
RDW: 13.1 % (ref 11.5–15.5)
WBC: 9.5 10*3/uL (ref 4.0–10.5)

## 2016-02-28 LAB — HEPATIC FUNCTION PANEL
ALT: 27 U/L (ref 14–54)
AST: 30 U/L (ref 15–41)
Albumin: 2.6 g/dL — ABNORMAL LOW (ref 3.5–5.0)
Alkaline Phosphatase: 71 U/L (ref 38–126)
BILIRUBIN DIRECT: 0.1 mg/dL (ref 0.1–0.5)
BILIRUBIN INDIRECT: 0.3 mg/dL (ref 0.3–0.9)
TOTAL PROTEIN: 5.8 g/dL — AB (ref 6.5–8.1)
Total Bilirubin: 0.4 mg/dL (ref 0.3–1.2)

## 2016-02-28 MED ORDER — PANTOPRAZOLE SODIUM 40 MG PO TBEC: 40.0000 mg | DELAYED_RELEASE_TABLET | Freq: Every day | ORAL | Status: DC

## 2016-02-28 MED ORDER — SPIRONOLACTONE 25 MG PO TABS: 12.5000 mg | ORAL_TABLET | Freq: Every day | ORAL | Status: AC

## 2016-02-28 MED ORDER — TICAGRELOR 90 MG PO TABS: 90.0000 mg | ORAL_TABLET | Freq: Two times a day (BID) | ORAL | Status: DC

## 2016-02-28 MED ORDER — CARVEDILOL 3.125 MG PO TABS: 1.5600 mg | ORAL_TABLET | Freq: Two times a day (BID) | ORAL | Status: DC

## 2016-02-28 MED ORDER — ATORVASTATIN CALCIUM 80 MG PO TABS: 80.0000 mg | ORAL_TABLET | Freq: Every day | ORAL | Status: DC

## 2016-02-28 MED ORDER — NITROGLYCERIN 0.4 MG SL SUBL: 0.4000 mg | SUBLINGUAL_TABLET | SUBLINGUAL | Status: DC | PRN

## 2016-02-28 MED ORDER — ASPIRIN 81 MG PO TBEC: 81.0000 mg | DELAYED_RELEASE_TABLET | Freq: Every day | ORAL | Status: DC

## 2016-02-28 MED ORDER — LISINOPRIL 2.5 MG PO TABS: 2.5000 mg | ORAL_TABLET | Freq: Every day | ORAL | Status: DC

## 2016-02-28 NOTE — Progress Notes (Signed)
Discharge instructions, RXs and follow up appts explained provided to patient and c/g verbalized understanding. Patient left floor via wheelchair accompanied by staff no c/o chest pain or shortness of breath at d/c.  Degan Hanser, Tivis Ringer, RN

## 2016-02-28 NOTE — Progress Notes (Signed)
Subjective:  Day 4 s/p STEMI/PCI; Chest pain resolved; feels better; ambulating without chest pain; BP earlier was 88 now 517 systolic; ASX.  Objective:   Vital Signs : Filed Vitals:   02/28/16 0700 02/28/16 0800 02/28/16 0900 02/28/16 1000  BP: '87/48 78/53 88/54 '   Pulse: 67 65 73 77  Temp: 97.9 F (36.6 C)     TempSrc: Oral     Resp: '20 18 17 28  ' Height:      Weight:      SpO2: 95% 95% 97% 97%    Intake/Output from previous day:  Intake/Output Summary (Last 24 hours) at 02/28/16 1038 Last data filed at 02/28/16 0500  Gross per 24 hour  Intake   1040 ml  Output    400 ml  Net    640 ml    I/O since admission: -1230  Wt Readings from Last 3 Encounters:  02/28/16 197 lb 8.5 oz (89.6 kg)  02/24/16 175 lb (79.379 kg)    Medications: . aspirin EC  81 mg Oral Daily  . atorvastatin  80 mg Oral q1800  . carvedilol  1.5625 mg Oral BID WC  . lisinopril  2.5 mg Oral Daily  . pantoprazole  40 mg Oral Q0600  . sodium chloride flush  3 mL Intravenous Q12H  . spironolactone  12.5 mg Oral Daily  . ticagrelor  90 mg Oral BID    . DOPamine    . nitroGLYCERIN Stopped (02/25/16 2200)    Physical Exam:   General appearance: alert, cooperative, no distress and obese. Neck: no adenopathy, no carotid bruit, no JVD, supple, symmetrical, trachea midline and thyroid not enlarged, symmetric, no tenderness/mass/nodules Lungs: decrease BS at bases; no rales Heart: regular rate and rhythm, no rub and faint 1/6 systolic murmur Abdomen: soft, non-tender; bowel sounds normal; no masses,  no organomegaly and liver not enlarged or tender Extremities: no edema, redness or tenderness in the calves or thighs Pulses: 2+ and symmetric; R radial site stable  Skin: Skin color, texture, turgor normal. No rashes or lesions Neurologic: Grossly normal   Rate: 69  Rhythm: normal sinus rhythm   ECG (independently read by me): NSR at 68; QS V1-4; T wave inversion; resolution of STE,   02/27/16  ECG (independently read by me): NSR at 66; ST elevation essentially resolved; T wave inversion   02/26/16 ECG (independently read by me): NSR at 78; QS V1-4 persistent ST elevation with evolving MI T wave inversion.  6/26/17ECG (independently read by me): NSR at 96; Q V1-5 with persistent ST elevation c/w anterior MI which probably began 2 days PTA  Lab Results:   Recent Labs  02/26/16 0252 02/27/16 0557 02/28/16 0345  NA 134* 135 136  K 4.0 3.9 3.6  CL 100* 104 106  CO2 26 20* 23  GLUCOSE 146* 110* 101*  BUN '8 11 7  ' CREATININE 0.86 0.88 0.77  CALCIUM 8.7* 8.3* 8.5*    Hepatic Function Latest Ref Rng 02/28/2016 02/26/2016 02/25/2016  Total Protein 6.5 - 8.1 g/dL 5.8(L) 6.1(L) 6.8  Albumin 3.5 - 5.0 g/dL 2.6(L) 2.9(L) 3.4(L)  AST 15 - 41 U/L 30 95(H) 234(H)  ALT 14 - 54 U/L 27 49 82(H)  Alk Phosphatase 38 - 126 U/L 71 76 80  Total Bilirubin 0.3 - 1.2 mg/dL 0.4 0.8 1.0  Bilirubin, Direct 0.1 - 0.5 mg/dL 0.1 0.2 -     Recent Labs  02/26/16 0252 02/27/16 0557 02/28/16 0345  WBC 13.5* 11.8* 9.5  HGB  11.9* 11.1* 10.9*  HCT 36.0 32.5* 33.0*  MCV 97.8 97.3 98.8  PLT 231 240 249    No results for input(s): TROPONINI in the last 72 hours.  Invalid input(s): CK, MB  Lab Results  Component Value Date   TSH 1.179 02/24/2016   No results for input(s): HGBA1C in the last 72 hours.   Recent Labs  02/26/16 0252 02/28/16 0345  PROT 6.1* 5.8*  ALBUMIN 2.9* 2.6*  AST 95* 30  ALT 49 27  ALKPHOS 76 71  BILITOT 0.8 0.4  BILIDIR 0.2 0.1  IBILI 0.6 0.3   No results for input(s): INR in the last 72 hours. BNP (last 3 results)  Recent Labs  02/24/16 1800 02/27/16 0557  BNP 584.5* 536.9*     ProBNP (last 3 results) No results for input(s): PROBNP in the last 8760 hours.   Lipid Panel     Component Value Date/Time   CHOL 170 02/25/2016 0319   TRIG 90 02/25/2016 0319   HDL 50 02/25/2016 0319   CHOLHDL 3.4 02/25/2016 0319   VLDL 18 02/25/2016 0319   LDLCALC  102* 02/25/2016 0319      Imaging:  No results found.  EMERGENT CATH/PCI: 02/24/16  Conclusion     2nd Mrg lesion, 20% stenosed.  Prox LAD-1 lesion, 40% stenosed.  Prox LAD-2 lesion, 99% stenosed. Post intervention, there is a 0% residual stenosis.  There is moderate to severe left ventricular systolic dysfunction.   Acute LV dysfunction with severe hypokinesis involving the mid and distal anterolateral wall apex and distal inferoapical segment with a global ejection fraction at 25-30%. LVEDP was elevated at 26 mm Hg.  Subtotal 99-100% stenosis in the proximal LAD with thrombus and TIMI 1 flow.  Mild 20% narrowing in the marginal branch of a dominant left circumflex coronary artery.  Normal nondominant RCA.  Successful PCI to the proximal LAD with ultimate stenting with a 3.2518 mm Xience Alpine DES stent postdilated to 3.5 mm with the 99% stenosis being reduced to 0% and resumption of brisk TIMI 3 flow.  RECOMMENDATION: The patient will require dual antiplatelet therapy for minimum of a year. With her ischemic cardiomyopathy she will be started on low-dose carvedilol and ACE inhibition with plans for dose titration. Aggressive lipid-lowering therapy will be instituted. Smoking cessation is imperative.     Indications    ST elevation myocardial infarction involving left anterior descending (LAD) coronary artery (HCC) [I21.02 (ICD-10-CM)]    Technique and Indications    [name] is a 55 year old female who has a > 30 year history of tobacco use. She has not seen a doctor in over 20 years. Two days ago she began to experience substernal chest pressure radiating to her back. Her chest pain persisted throughout the weekend. Today she presented to [facility] emergency room. Her ECG revealed ST segment elevation anterior MI. A troponin level was sent which came back 81.55. A code STEMI was activated; she was given heparin 5000 units and was transported to  [facility] for emergent cardiac catheterization.  Upon arrival to the catheterization laboratory the patient's chest pain had improved but she still had mild discomfort The patient was premedicated with Versed 2 mg and fentanyl 50 mcg. A right radial approach was utilized after an Allen's test verified adequate circulation. The right radial artery was punctured via the Seldinger technique, and a 6 [place] Glidesheath Slender was inserted without difficulty. A radial cocktail consisting of Verapamil, IV nitroglycerin, and lidocaine was administered. Weight adjusted  heparin was administered. A safety J wire was advanced into the ascending aorta. Diagnostic catheterization was done with a 5 [place] JR4, and a 6 [place] XB LAD 3.5 guide catheter. With the demonstration of a 99/100% stenosis in the proximal LAD with TIMI 1 flow the decision was made to proceed with PCI. Angiomax bolus plus infusion was administered. The patient received Brilinta 180 mg during the interventional procedure. A Choice PT moderate support wire was able to cross the subtotal occlusion. Predilatation was done with a 2.512 mm balloon. A 3.2518 mm Xience Alpine DES stent was then inserted which covered both the 40% and 99% stenosis. This was deployed up at 14 and 15 atm. Marland Kitchen Post stent dilatation was done with a 3.512 mm noncompliant balloon. A 5 French pigtail catheter was used for left ventriculography. A TR radial band was applied for hemostasis. The patient left the catheterization laboratory chest pain-free with stable hemodynamics. During this procedure the patient was administered a total of Versed 2 mg and Fentanyl 50 mg to achieve and maintain moderate conscious sedation. The patient's heart rate, blood pressure, and oxygen saturation were monitored continuously during the procedure. The period of conscious sedation was 46 minutes, of which I was present face-to-face 100% of this time. Estimated blood loss <50 mL. There were no  immediate complications during the procedure.    Coronary Findings    Dominance: Left   Left Main  The left main coronary artery was a normal vessel which trifurcated into a large LAD, a small intermediate, and a large dominant artery left circumflex coronary artery.     Left Anterior Descending  The LAD is a large caliber vessel that gave rise to a very small proximal septal vessel. Beyond this septal was a 40% stenosis followed by 99/100% stenosis with thrombus. There was TIMI 1 flow beyond the stenosis.   . Prox LAD-1 lesion, 40% stenosed.   Marland Kitchen PCI: The pre-interventional distal flow is decreased (TIMI 1). Pre-stent angioplasty was performed. A drug-eluting stent was placed. The strut is apposed. Post-stent angioplasty was performed. The post-interventional distal flow is normal (TIMI 3). The intervention was successful. No complications occurred at this lesion.  . Supplies used: BALLOON EUPHORA ES9.7N30; STENT XIENCE ALPINE YF 1.10Y11; BALLOON [place] [id number]  . There is no residual stenosis post intervention.     . Prox LAD-2 lesion, 99% stenosed. Thrombotic.   Marland Kitchen PCI: The pre-interventional distal flow is decreased (TIMI 1). Pre-stent angioplasty was performed. A drug-eluting stent was placed. The strut is apposed. Post-stent angioplasty was performed. The post-interventional distal flow is normal (TIMI 3). The intervention was successful. No complications occurred at this lesion.  . Supplies used: BALLOON EUPHORA ZN3.5A70; STENT XIENCE ALPINE LI 1.03U13; BALLOON [place] [id number]  . There is no residual stenosis post intervention.       Ramus Intermedius  . Vessel is small. The ramus was a small diminutive size normal vessel   . Lateral Ramus Intermedius   The vessel is small in size.     Left Circumflex  Circumflex was a dominant vessel. The proximal diagonal vessel had smooth 20% proximal narrowing at a site of a proximal branch. The circumflex supplied the PDA and PLA  vessel   . Second [employer]   . 2nd Mrg lesion, 20% stenosed.     Right Coronary Artery  The RCA was angiographically normal, nondominant vessel.      Wall Motion  Left Heart    Left Ventricle The left ventricular size is normal. There is moderate to severe left ventricular systolic dysfunction. The left ventricular ejection fraction is 25-35% by visual estimate. There are wall motion abnormalities in the left ventricle. Left ventriculography revealed severe hypokinesis involving the mid and distal anterolateral wall, apex and distal inferior apical segment. There was hypokinesis of the basal segments. Global ejection fraction was 25-30%.    Coronary Diagrams    Diagnostic Diagram           Post-Intervention Diagram            Implants     Permanent Stent   Stent Xience Alpine Rx 3.25x18 [phone] - Implanted    Inventory item: Stent Xience Alpine Rx 3.25x18 Model/Cat no.: [id number]   Serial no.:  Manufacturer: [employer]   Lot no.: [id number] Size:    As of 02/24/2016    Status: Implanted              PACS Images    Show images for Cardiac catheterization     Link to Procedure Log    Procedure Log      Hemo Data       Most Recent Value   AO Systolic Pressure  982 mmHg   AO Diastolic Pressure  65 mmHg   AO Mean  86 mmHg   LV Systolic Pressure  641 mmHg   LV Diastolic Pressure  11 mmHg   LV EDP  26 mmHg   Arterial Occlusion Pressure Extended Systolic Pressure  583 mmHg   Arterial Occlusion Pressure Extended Diastolic Pressure  74 mmHg   Arterial Occlusion Pressure Extended Mean Pressure  92 mmHg   Left Ventricular Apex Extended Systolic Pressure  094 mmHg   Left Ventricular Apex Extended Diastolic Pressure  15 mmHg   Left Ventricular Apex Extended EDP Pressure  28 mmHg    ------------------------------------------------------------------- ECHO Study Conclusions  - Left  ventricle: The cavity size was normal. Wall thickness was  increased in a pattern of mild LVH. 40  lic function was  moderately reduced. The estimated ejection fraction was in the  range of 35% to 40%. Mid to distal anterior, apical and  inferoapical akinesis suggestive of LAD territory  ischemia/infarct. Doppler parameters are consistent with abnormal  left ventricular relaxation (grade 1 diastolic dysfunction). The  E/e&' ratio is between 8-15, suggesting indeterminate LV filling  pressure. - Mitral valve: Mildly thickened leaflets . There was trivial  regurgitation. - Left atrium: Mildly dilated. - Tricuspid valve: There was no significant regurgitation. - Inferior vena cava: The vessel was dilated. The respirophasic  diameter changes were blunted (< 50%), consistent with elevated  central venous pressure.  Impressions:  - LVEF 35-40%, anterior, apical and inferoapical akinesis  suggestive of LAD territory ischemia/infarct, mild LVH, diastolic  dysfunction, indeterminate LV filling pressure, trivial MR, mild  LAE, dilated IVC.   Assessment/Plan:   Active Problems:   Tobacco abuse   ST elevation myocardial infarction (STEMI) of anterior wall, initial episode of care [facility])   STEMI (ST elevation myocardial infarction) ([place])   Cardiomyopathy, ischemic  1. Anterior STEMI; initial pain on 6/23; upon arrival to hospital 6/25 >Trop 81; Day 3 S/p PCI to proximal LAD with DES stent; on ASA/brilinta; no recurrent chest pain; evolving ECG changes on ECG with QS V1-5; ST elevation has essentially resolved today. 2. Ischemic cardiomyopathy 2 to AMI: now on carvedilol, lisinopril, and spironolactone 12.5 mg. Acute EF 25%; echo yesterday  shows EF slightly improved now 35 - 40%.  No apical thrombus - will dc heparin.    3. Abnormal LFT's : prob 2 to large MI; significantly improving 4: Leukocytosis: improving prob stress mediated; now resolved 5. Tobacco abuse: discussed  smoking cessation 6. Obesity 7. Mild Hyperlipidemia: now on atorvastatin at 80 mg daily.  8. Anxiety: 9. Stomach ache:  Better onprotonix  BP dropped following coreg dose; better today on 1/ 3.125 bid regimen.  Ambulating well with cardiac rehab. Long discussion with pt and husband.  Will walk additional  today; monitor BP and if stable dc later today ~ 3 pm with PA f/u in 1 week and then f/u with me in 4 weeks.   Troy Sine, MD, [facility] 02/28/2016, 10:38 AM

## 2016-02-28 NOTE — Progress Notes (Signed)
CARDIAC REHAB PHASE I   PRE:  Rate/Rhythm: 76 SR    BP: sitting 109/72    SaO2: 98 RA  MODE:  Ambulation: 700 ft   POST:  Rate/Rhythm: 91 SR    BP: sitting 108/68     SaO2: 98 RA  Tolerated well, stable BP. Ed completed with pt and husband. Good reception. More receptive today to smoking education. Understands importance of Brilinta/ASA, restrictions, ex, diet, daily wts and CRPII.  [id number]   [place], ACSM 02/28/2016 11:20 AM

## 2016-02-28 NOTE — Discharge Summary (Signed)
Discharge Summary    Patient ID: Erika Rios,  MRN: [id number], DOB/AGE: [birth year] 54 y.o.  Admit date: 02/24/2016 Discharge date: 02/28/2016  Primary Care Provider: [facility] Primary Cardiologist: Dr. Claiborne Billings ([place])  Discharge Diagnoses    Principal Problem:   ST elevation myocardial infarction (STEMI) of anterior wall, initial episode of care [facility]) Active Problems:   Tobacco abuse   STEMI (ST elevation myocardial infarction) ([place])   Cardiomyopathy, ischemic    Leukocytosis    Obesity    Mild hyperlipidemia    Stomach ache   Allergies Allergies  Allergen Reactions  . Elastic Bandages & [Zinc] Rash  . Oxycodone Anxiety and Other (See Comments)    Diagnostic Studies/Procedures    Coronary Stent Intervention    Left Heart Cath and Coronary Angiography    Conclusion     2nd Mrg lesion, 20% stenosed.  Prox LAD-1 lesion, 40% stenosed.  Prox LAD-2 lesion, 99% stenosed. Post intervention, there is a 0% residual stenosis.  There is moderate to severe left ventricular systolic dysfunction.   Acute LV dysfunction with severe hypokinesis involving the mid and distal anterolateral wall apex and distal inferoapical segment with a global ejection fraction at 25-30%. LVEDP was elevated at 26 mm Hg.  Subtotal 99-100% stenosis in the proximal LAD with thrombus and TIMI 1 flow.  Mild 20% narrowing in the marginal branch of a dominant left circumflex coronary artery.  Normal nondominant RCA.  Successful PCI to the proximal LAD with ultimate stenting with a 3.2518 mm Xience Alpine DES stent postdilated to 3.5 mm with the 99% stenosis being reduced to 0% and resumption of brisk TIMI 3 flow.  RECOMMENDATION: The patient will require dual antiplatelet therapy for minimum of a year. With her ischemic cardiomyopathy she will be started on low-dose carvedilol and ACE inhibition with plans for dose titration. Aggressive lipid-lowering therapy will be instituted.  Smoking cessation is imperative.    Echo 02/26/16 LV EF: 35% - 40%  ------------------------------------------------------------------- Indications: Cardiomyopathy - ischemic 414.8.  ------------------------------------------------------------------- History: PMH: Anterior STEMI. Smoker.  ------------------------------------------------------------------- Study Conclusions  - Left ventricle: The cavity size was normal. Wall thickness was  increased in a pattern of mild LVH. Systolic function was  moderately reduced. The estimated ejection fraction was in the  range of 35% to 40%. Mid to distal anterior, apical and  inferoapical akinesis suggestive of LAD territory  ischemia/infarct. Doppler parameters are consistent with abnormal  left ventricular relaxation (grade 1 diastolic dysfunction). The  E/e&' ratio is between 8-15, suggesting indeterminate LV filling  pressure. - Mitral valve: Mildly thickened leaflets . There was trivial  regurgitation. - Left atrium: Mildly dilated. - Tricuspid valve: There was no significant regurgitation. - Inferior vena cava: The vessel was dilated. The respirophasic  diameter changes were blunted (< 50%), consistent with elevated  central venous pressure.  Impressions:  - LVEF 35-40%, anterior, apical and inferoapical akinesis  suggestive of LAD territory ischemia/infarct, mild LVH, diastolic  dysfunction, indeterminate LV filling pressure, trivial MR, mild  LAE, dilated IVC.  History of Present Illness     Erika Rios is a 55 y.o. female with a history of tobacco abuse and no past medical history by virtue of not seeing a medical doctor in over 20 years who was transferred from [facility] to [facility] for anterolateral STEMI.  She denies a history of diabetes, hypertension, hyperlipidemia, CVA, TIA. She denies a family history of heart disease except her mother who had cancer "all over her body"  and "her heart exploded."    She smokes half a pack a day for over 30 years. She does not take any medications.  She was in her usual state of health until this past Friday when she started to notice chest and back pain. The back pain felt like a stabbing pain that was associated with shortness of breath and nausea/vomiting. The pain was constant until her husband persuaded her to seek medical attention. Not related to exertion, actually improved with exertion. She presented to [facility] for evaluation and ECG showed Anterolateral ST elevation. She was transferred to [facility] for emergent heart catheterization as a Code STEMI. Initial troponin over 85.  Hospital Course     Consultants: None  1. Anterior STEMI; initial pain on 6/23; upon arrival to hospital 6/25 >Trop 81;  S/p PCI to proximal LAD with DES stent; on ASA/brilinta; no recurrent chest pain; evolving ECG changes on ECG with QS V1-5; ST elevation has essentially resolved  02/28/16. Ambulated without chest pain.   2. Ischemic cardiomyopathy 2 to AMI: now on carvedilol, lisinopril, and spironolactone 12.5 mg. Acute EF 25%; echo shows EF slightly improved now 35 - 40%. No apical thrombus - discontinued heparin.   3. Abnormal LFT's : Prob 2 to large MI; significantly improving.   4: Leukocytosis: Improving prob stress mediated; now resolved  5. Tobacco abuse: Discussed smoking cessation  6. Mild Hyperlipidemia: now on atorvastatin at 80 mg daily.   7: Stomach ache: Better on protonix  The patient has been seen by Dr. Claiborne Billings  today and deemed ready for discharge home. All follow-up appointments have been scheduled. Discharge medications are listed below.    Discharge Vitals Blood pressure 108/72, pulse 70, temperature 97.7 F (36.5 C), temperature source Oral, resp. rate 25, height 5\' 2"  (1.575 m), weight 197 lb 8.5 oz (89.6 kg), SpO2 95 %.  Filed Weights   02/26/16 0143 02/27/16 0400 02/28/16 0323  Weight: 199 lb 1.2 oz (90.3 kg) 200 lb 2.8 oz (90.8 kg) 197 lb  8.5 oz (89.6 kg)    Labs & Radiologic Studies     CBC  Recent Labs  02/27/16 0557 02/28/16 0345  WBC 11.8* 9.5  HGB 11.1* 10.9*  HCT 32.5* 33.0*  MCV 97.3 98.8  PLT 240 0000000   Basic Metabolic Panel  Recent Labs  02/27/16 0557 02/28/16 0345  NA 135 136  K 3.9 3.6  CL 104 106  CO2 20* 23  GLUCOSE 110* 101*  BUN 11 7  CREATININE 0.88 0.77  CALCIUM 8.3* 8.5*   Liver Function Tests  Recent Labs  02/26/16 0252 02/28/16 0345  AST 95* 30  ALT 49 27  ALKPHOS 76 71  BILITOT 0.8 0.4  PROT 6.1* 5.8*  ALBUMIN 2.9* 2.6*   No results for input(s): LIPASE, AMYLASE in the last 72 hours. Cardiac Enzymes No results for input(s): CKTOTAL, CKMB, CKMBINDEX, TROPONINI in the last 72 hours. BNP Invalid input(s): POCBNP D-Dimer No results for input(s): DDIMER in the last 72 hours. Hemoglobin A1C No results for input(s): HGBA1C in the last 72 hours. Fasting Lipid Panel No results for input(s): CHOL, HDL, LDLCALC, TRIG, CHOLHDL, LDLDIRECT in the last 72 hours. Thyroid Function Tests No results for input(s): TSH, T4TOTAL, T3FREE, THYROIDAB in the last 72 hours.  Invalid input(s): FREET3  Dg Chest Port 1 View  02/24/2016  CLINICAL DATA:  Intermittent chest pain for 2 days, shortness of breath EXAM: PORTABLE CHEST 1 VIEW COMPARISON:  None. FINDINGS: Cardiomediastinal silhouette is unremarkable. No  acute infiltrate or pleural effusion. No pulmonary edema. Mild basilar atelectasis. Bony thorax is unremarkable. IMPRESSION: No infiltrate or pulmonary edema.  Mild basilar atelectasis. Electronically Signed   By: Lahoma Crocker M.D.   On: 02/24/2016 13:54    Disposition   Pt is being discharged home today in good condition.  Follow-up Plans & Appointments    Follow-up Information    Follow up with Almyra Deforest, PA. Go on 03/07/2016.   Specialties:  Cardiology, Radiology   Why:  @8 :00am for TCM   Contact information:   [place] [address] [phone]       Discharge Instructions    Amb Referral to Cardiac Rehabilitation    Complete by:  As directed   Diagnosis:   STEMI Coronary Stents       Diet - low sodium heart healthy    Complete by:  As directed      Discharge instructions    Complete by:  As directed   No driving for 2 weeks. No lifting over 10 lbs for 4 weeks. No sexual activity for 4 weeks. You may not return to work until 02/09/16 Keep procedure site clean & dry. If you notice increased pain, swelling, bleeding or pus, call/return!  You may shower, but no soaking baths/hot tubs/pools for 5 days.     Increase activity slowly    Complete by:  As directed            Discharge Medications   Current Discharge Medication List    START taking these medications   Details  aspirin EC 81 MG EC tablet Take 1 tablet (81 mg total) by mouth daily.    atorvastatin (LIPITOR) 80 MG tablet Take 1 tablet (80 mg total) by mouth daily at 6 PM. Qty: 30 tablet, Refills: 6    carvedilol (COREG) 3.125 MG tablet Take 0.5 tablets (1.56 mg total) by mouth 2 (two) times daily with a meal. Qty: 30 tablet, Refills: 6    lisinopril (PRINIVIL,ZESTRIL) 2.5 MG tablet Take 1 tablet (2.5 mg total) by mouth daily. Qty: 30 tablet, Refills: 6    nitroGLYCERIN (NITROSTAT) 0.4 MG SL tablet Place 1 tablet (0.4 mg total) under the tongue every 5 (five) minutes x 3 doses as needed for chest pain. Qty: 25 tablet, Refills: 12    pantoprazole (PROTONIX) 40 MG tablet Take 1 tablet (40 mg total) by mouth daily at 6 (six) AM. Qty: 30 tablet, Refills: 6    spironolactone (ALDACTONE) 25 MG tablet Take 0.5 tablets (12.5 mg total) by mouth daily. Qty: 30 tablet, Refills: 6    ticagrelor (BRILINTA) 90 MG TABS tablet Take 1 tablet (90 mg total) by mouth 2 (two) times daily. Qty: 60 tablet, Refills: 11      CONTINUE these medications which have NOT CHANGED   Details  acetaminophen (TYLENOL) 500 MG tablet Take 500 mg by mouth every 6 (six) hours as needed for mild  pain.    gabapentin (NEURONTIN) 300 MG capsule Take 600 mg by mouth 2 (two) times daily.    methocarbamol (ROBAXIN) 750 MG tablet Take 750 mg by mouth 3 (three) times daily.    Multiple Vitamins-Minerals (MULTIVITAMIN WITH MINERALS) tablet Take 1 tablet by mouth daily.    nabumetone (RELAFEN) 750 MG tablet Take 750 mg by mouth daily as needed for mild pain.         Aspirin prescribed at discharge?  Yes High Intensity Statin Prescribed? (Lipitor 40-80mg  or Crestor 20-40mg ):  Yes Beta Blocker Prescribed? Yes For EF 45% or less, Was ACEI/ARB Prescribed? Yes ADP Receptor Inhibitor Prescribed? (i.e. Plavix etc.-Includes Medically Managed Patients): Yes For EF <40%, Aldosterone Inhibitor Prescribed? Yes Was EF assessed during THIS hospitalization? Yes Was Cardiac Rehab II ordered? (Included Medically managed Patients): Yes   Outstanding Labs/Studies   LFT and lipid panel in 4-6 weeks  Duration of Discharge Encounter   Greater than 30 minutes including physician time.  Signed, Tatem Fesler PA-C 02/28/2016, 4:04 PM

## 2016-02-29 ENCOUNTER — Telehealth: Payer: Self-pay | Admitting: Cardiovascular Disease

## 2016-02-29 DIAGNOSIS — Z955 Presence of coronary angioplasty implant and graft: Secondary | ICD-10-CM | POA: Insufficient documentation

## 2016-02-29 NOTE — Telephone Encounter (Signed)
Patient contacted regarding discharge from [facility] on6/29/17.  Patient understands to follow up with provider MENG on 03/07/16 at 8:00 AM at  [facility]. Patient understands discharge instructions? yes  Patient understands medications and regiment? yes  Patient understands to bring all medications to this visit? yes   [place].

## 2016-02-29 NOTE — Telephone Encounter (Signed)
New message      This is TCM appointment per PA-Vinkumar on 6/29@1529 , Dx; Post hospital

## 2016-03-06 ENCOUNTER — Telehealth: Payer: Self-pay | Admitting: Cardiovascular Disease

## 2016-03-06 MED ORDER — PRASUGREL HCL 10 MG PO TABS: 10.0000 mg | ORAL_TABLET | Freq: Every day | ORAL | Status: DC

## 2016-03-06 NOTE — Progress Notes (Signed)
Cardiology Office Note    Date:  03/07/2016   ID:  DELVINA Rios, DOB 1960/12/31, MRN [id number]  PCP:  Golden Pop, MD  Cardiologist:  Dr. Claiborne Billings  Chief Complaint  Patient presents with  . Transitions Of Care    7 day TCM followup, seen for Dr. Claiborne Billings    History of Present Illness:  Erika Rios is a 55 y.o. female with PMH of tobacco abuse and recently diagnosed CAD. She was transferred from [facility] to [facility] as anterolateral STEMI on 02/24/2016. Her initial white blood cell count was elevated at 32.0 and her troponin was 81.55. She underwent emergent cardiac cath on 02/24/2016 which showed 99% prox LAD lesion treated with 3.25 x 76mm Xience Alpine DES postdilated to 3.49mm, 20% OM2 lesion, acute LV dysfunction with severe hypokinesis involving the mid and distal anterolateral wall apex and distal inferoapical segment with EF 25-30%, LVEDP elevated at 26 mmHg. Post procedure, patient was placed on aspirin and Brilinta. Carvedilol was added along with lisinopril and spironolactone. Lipid panel obtained on 6/26 showed cholesterol 170, triglycerides 90, HDL 50, LDL 102. Hemoglobin A1c was 5.9 consistent with prediabetes. Echocardiogram obtained on 02/26/2016 showed EF 35-40%, mid to distal anterior, apical, inferoapical akinesis suggestive of LAD territory ischemia/infarct. After addition of carvedilol, she did have a brief episode of hypotension requiring dopamine. She was eventually discharged on 02/28/2016. Prior to discharge, her white blood cell count has returned back to normal.  Patient presents today for seven-day transition of care follow-up. Patient has been doing well since left the hospital, only complaint is starting 2 days ago, she started having diffuse pruritic rash all over. They appears to be papular macular in nature. Due to the presence of rash, her Brilinta has been switched to Effient. She is starting Effient today. She has been taking Benadryl. I will hold off on initiating steroid  therapy at this time. However I have advised her to follow-up with her PCP as soon as possible to monitor to spread and progression of the rash. If it continued to get worse, we may have to start her on steroids. I have reviewed all of her medication with clinical pharmacist, other potential offending agent including Lipitor and carvedilol. However she is on a below minimal dose of carvedilol. I will obtain CBC, BMP, and LFT today. Lower suspicion for liver failure at this time, however will make sure her LFT is stable. Her WBC was > 32 in the hospital, given new onset of rash, will recheck CBC. I have also discussed with her diet and exercise for her prediabetes, she will need to followup with her PCP.   F/U with Dr. Claiborne Billings in 4 weeks per his hospital note instruction. His schedule is full, we have sent staff passage to his nurse to arrange follow-up. She works for the [employer] and works 9 hours a day. Given her weakness after a general anterior MI, she does not think she can go back to work at this time, I will give her 3 weeks work note. If she needed more, she will need to discuss with Dr. Claiborne Billings. We will initiate cardiac rehabilitation.   Past Medical History  Diagnosis Date  . Tobacco abuse   . ST elevation myocardial infarction (STEMI) of anterior wall, initial episode of care [facility])     Past Surgical History  Procedure Laterality Date  . Cardiac catheterization N/A 02/24/2016    Procedure: Left Heart Cath and Coronary Angiography;  Surgeon: Marcello Moores  Floyce Stakes, MD;  Location: [place] [facility];  Service: Cardiovascular;  Laterality: N/A;  . Cardiac catheterization N/A 02/24/2016    Procedure: Coronary Stent Intervention;  Surgeon: Troy Sine, MD;  Location: [place] [facility];  Service: Cardiovascular;  Laterality: N/A;    Current Medications: Outpatient Prescriptions Prior to Visit  Medication Sig Dispense Refill  . acetaminophen (TYLENOL) 500 MG tablet Take  500 mg by mouth every 6 (six) hours as needed for mild pain.    Marland Kitchen aspirin EC 81 MG EC tablet Take 1 tablet (81 mg total) by mouth daily.    Marland Kitchen atorvastatin (LIPITOR) 80 MG tablet Take 1 tablet (80 mg total) by mouth daily at 6 PM. 30 tablet 6  . carvedilol (COREG) 3.125 MG tablet Take 0.5 tablets (1.56 mg total) by mouth 2 (two) times daily with a meal. 30 tablet 6  . lisinopril (PRINIVIL,ZESTRIL) 2.5 MG tablet Take 1 tablet (2.5 mg total) by mouth daily. 30 tablet 6  . Multiple Vitamins-Minerals (MULTIVITAMIN WITH MINERALS) tablet Take 1 tablet by mouth daily.    . nabumetone (RELAFEN) 750 MG tablet Take 750 mg by mouth daily as needed for mild pain.    . nitroGLYCERIN (NITROSTAT) 0.4 MG SL tablet Place 1 tablet (0.4 mg total) under the tongue every 5 (five) minutes x 3 doses as needed for chest pain. 25 tablet 12  . pantoprazole (PROTONIX) 40 MG tablet Take 1 tablet (40 mg total) by mouth daily at 6 (six) AM. 30 tablet 6  . prasugrel (EFFIENT) 10 MG TABS tablet Take 1 tablet (10 mg total) by mouth daily. 30 tablet 6  . spironolactone (ALDACTONE) 25 MG tablet Take 0.5 tablets (12.5 mg total) by mouth daily. 30 tablet 6  . gabapentin (NEURONTIN) 300 MG capsule Take 600 mg by mouth 2 (two) times daily. Reported on 03/07/2016    . methocarbamol (ROBAXIN) 750 MG tablet Take 750 mg by mouth 3 (three) times daily. Reported on 03/07/2016     No facility-administered medications prior to visit.     Allergies:   Latex; Elastic bandages &; and Oxycodone   Social History   Social History  . Marital Status: Married    Spouse Name: N/A  . Number of Children: N/A  . Years of Education: N/A   Social History Main Topics  . Smoking status: Current Every Day Smoker -- 0.50 packs/day for 34 years    Types: Cigarettes  . Smokeless tobacco: None  . Alcohol Use: No  . Drug Use: No  . Sexual Activity: Not Asked   Other Topics Concern  . None   Social History Narrative     Family History:  The patient's  family history includes Cancer in her mother; Diabetes in her father; Heart attack in her mother; Heart disease in her mother.   ROS:   Please see the history of present illness.    ROS All other systems reviewed and are negative.   PHYSICAL EXAM:   VS:  BP 100/70 mmHg  Pulse 68  Ht 5\' 2"  (1.575 m)  Wt 192 lb 6.4 oz (87.272 kg)  BMI 35.18 kg/m2   GEN: Well nourished, well developed, in no acute distress HEENT: normal Neck: no JVD, carotid bruits, or masses Cardiac: RRR; no murmurs, rubs, or gallops,no edema  Respiratory:  clear to auscultation bilaterally, normal work of breathing GI: soft, nontender, nondistended, + BS MS: no deformity or atrophy Skin: warm and dry. +diffuse macular papular rash Neuro:  Alert  and Oriented x 3, Strength and sensation are intact Psych: euthymic mood, full affect  Wt Readings from Last 3 Encounters:  03/07/16 192 lb 6.4 oz (87.272 kg)  02/28/16 197 lb 8.5 oz (89.6 kg)  02/24/16 175 lb (79.379 kg)      Studies/Labs Reviewed:   EKG:  EKG is ordered today.  The ekg ordered today demonstrates TWI in inferolateral leads and persistent minimal J point elevation in anterior leads unchanged when compare to last EKG in the hospital  Recent Labs: 02/24/2016: TSH 1.179 02/27/2016: B Natriuretic Peptide 536.9* 02/28/2016: ALT 27; BUN 7; Creatinine, Ser 0.77; Hemoglobin 10.9*; Platelets 249; Potassium 3.6; Sodium 136   Lipid Panel    Component Value Date/Time   CHOL 170 02/25/2016 0319   TRIG 90 02/25/2016 0319   HDL 50 02/25/2016 0319   CHOLHDL 3.4 02/25/2016 0319   VLDL 18 02/25/2016 0319   LDLCALC 102* 02/25/2016 0319    Additional studies/ records that were reviewed today include:   Cath 02/24/2016 Conclusion     2nd Mrg lesion, 20% stenosed.  Prox LAD-1 lesion, 40% stenosed.  Prox LAD-2 lesion, 99% stenosed. Post intervention, there is a 0% residual stenosis.  There is moderate to severe left ventricular systolic  dysfunction.   Acute LV dysfunction with severe hypokinesis involving the mid and distal anterolateral wall apex and distal inferoapical segment with a global ejection fraction at 25-30%. LVEDP was elevated at 26 mm Hg.  Subtotal 99-100% stenosis in the proximal LAD with thrombus and TIMI 1 flow.  Mild 20% narrowing in the marginal branch of a dominant left circumflex coronary artery.  Normal nondominant RCA.  Successful PCI to the proximal LAD with ultimate stenting with a 3.2518 mm Xience Alpine DES stent postdilated to 3.5 mm with the 99% stenosis being reduced to 0% and resumption of brisk TIMI 3 flow.      Echo 02/26/2016 LV EF: 35% - 40%  ------------------------------------------------------------------  - Left ventricle: The cavity size was normal. Wall thickness was  increased in a pattern of mild LVH. Systolic function was  moderately reduced. The estimated ejection fraction was in the  range of 35% to 40%. Mid to distal anterior, apical and  inferoapical akinesis suggestive of LAD territory  ischemia/infarct. Doppler parameters are consistent with abnormal  left ventricular relaxation (grade 1 diastolic dysfunction). The  E/e&' ratio is between 8-15, suggesting indeterminate LV filling  pressure. - Mitral valve: Mildly thickened leaflets . There was trivial  regurgitation. - Left atrium: Mildly dilated. - Tricuspid valve: There was no significant regurgitation. - Inferior vena cava: The vessel was dilated. The respirophasic  diameter changes were blunted (< 50%), consistent with elevated  central venous pressure.  Impressions:  - LVEF 35-40%, anterior, apical and inferoapical akinesis  suggestive of LAD territory ischemia/infarct, mild LVH, diastolic  dysfunction, indeterminate LV filling pressure, trivial MR, mild  LAE, dilated IVC.     ASSESSMENT:    1. Coronary artery disease involving native coronary artery of native heart without  angina pectoris   2. Tobacco abuse   3. Prediabetes   4. Cardiomyopathy, ischemic   5. Chronic systolic heart failure ([place])   6. ST elevation myocardial infarction involving left anterior descending (LAD) coronary artery (HCC)      PLAN:  In order of problems listed above:  1. CAD s/p recent anterior STEMI  - She is stable from cardiac perspective. She denies any chest pain or shortness breath. Continue on carvedilol, lisinopril, spironolactone, Lipitor, aspirin and  Effient. Note she was previously on Proventil, however due to occurrence of a rash, this has been switched to Effient.  2. ICM/Chronic systolic HF: [id number] on cardiac catheterization, however last echocardiogram prior to discharge shows her EF has improved to 35-40%. EKG does show some Q waves in the anterior lead consistent with at least some degree of infarction. Hopefully with carvedilol, lisinopril, and spironolactone, her overall LVEF will improve.  3. Tobacco abuse: I have discussed with the patient importance of tobacco cessation, she says she has not smoked since left the hospital.  4. Diffuse macular papular rash: Diffuse on her back and on her upper thigh, pleuritic in nature. Unclear cause, however high suspicion of medication related. She has already been switched from presented to Effient. I have discussed with our clinical pharmacist, other suspicion include carvedilol and the Lipitor. Unfortunately she was started on many medications during this recent hospitalization, and if her rash does not improve, it is advisable that we do a trial having her coming off some the medication. However she will need dual antiplatelet therapy given critical location of the stent. Given initiation of Lipitor, I will obtain LFT today to make sure she is not having any, liver failure. We will also obtain CBC and BMP today.  5. Prediabetes: Hgb A1C 5.9, advised exercise and diet control and followup with PCP.    Medication  Adjustments/Labs and Tests Ordered: Current medicines are reviewed at length with the patient today.  Concerns regarding medicines are outlined above.  Medication changes, Labs and Tests ordered today are listed in the Patient Instructions below. Patient Instructions  Medication Instructions:  Your physician recommends that you continue on your current medications as directed. Please refer to the Current Medication list given to you today.   Labwork: Your physician recommends that you have lab work today: bmet/cbc/lft   Testing/Procedures: -None  Follow-Up: Follow up with Primary Care Doctor in 1-2 weeks Will send Dr. Evette Georges nurse a message to get appointment for 4 week f/u.  If you do not here from office in 5 days please call office to follow up on appointment at [phone].  You have been referred to Cardiac Rehab, someone from rehab will call to set up appointment.    Any Other Special Instructions Will Be Listed Below (If Applicable).  Patient was given a work note today, will return to work on 03/24/2016   If you need a refill on your cardiac medications before your next appointment, please call your pharmacy.       Hilbert Corrigan, [place]  03/07/2016 8:53 AM    [address], [place], [place]  [address] Phone: [phone]; Fax: [phone]

## 2016-03-06 NOTE — Telephone Encounter (Signed)
Returned call to patient.She stated she has a red rash under both arms and back.Stated rash started yesterday behind knee and now has spread.Spoke to DOD Dr.Croitoru he advised to stop Brilinta.Start Effient 10 mg daily tomorrow.Advised may take Benadryl over the counter as needed.Advised to keep appointment with Almyra Deforest PA 03/07/16 at 8:00 am at [facility] office.

## 2016-03-06 NOTE — Telephone Encounter (Signed)
Erika Rios is calling because she has develop a rash which started behind  her knee , now its on her back and between her legs and it itches . Marland Kitchen She was just release from hospital on last Thursday )  Please call   Thanks

## 2016-03-07 ENCOUNTER — Encounter: Payer: Self-pay | Admitting: Physician Assistant

## 2016-03-07 ENCOUNTER — Ambulatory Visit (INDEPENDENT_AMBULATORY_CARE_PROVIDER_SITE_OTHER): Payer: BC Managed Care – PPO | Admitting: Physician Assistant

## 2016-03-07 VITALS — BP 100/70 | HR 68 | Ht 62.0 in | Wt 192.4 lb

## 2016-03-07 DIAGNOSIS — I255 Ischemic cardiomyopathy: Secondary | ICD-10-CM

## 2016-03-07 DIAGNOSIS — R7303 Prediabetes: Secondary | ICD-10-CM | POA: Diagnosis not present

## 2016-03-07 DIAGNOSIS — I2510 Atherosclerotic heart disease of native coronary artery without angina pectoris: Secondary | ICD-10-CM | POA: Diagnosis not present

## 2016-03-07 DIAGNOSIS — I5022 Chronic systolic (congestive) heart failure: Secondary | ICD-10-CM

## 2016-03-07 DIAGNOSIS — I2102 ST elevation (STEMI) myocardial infarction involving left anterior descending coronary artery: Secondary | ICD-10-CM

## 2016-03-07 DIAGNOSIS — Z720 Tobacco use: Secondary | ICD-10-CM

## 2016-03-07 LAB — CBC WITH DIFFERENTIAL/PLATELET
BASOS PCT: 1 %
Basophils Absolute: 113 cells/uL (ref 0–200)
Eosinophils Absolute: 339 cells/uL (ref 15–500)
Eosinophils Relative: 3 %
HEMATOCRIT: 38.5 % (ref 35.0–45.0)
Hemoglobin: 13 g/dL (ref 11.7–15.5)
Lymphocytes Relative: 16 %
Lymphs Abs: 1808 cells/uL (ref 850–3900)
MCH: 32.3 pg (ref 27.0–33.0)
MCHC: 33.8 g/dL (ref 32.0–36.0)
MCV: 95.8 fL (ref 80.0–100.0)
MPV: 9 fL (ref 7.5–12.5)
Monocytes Absolute: 1243 cells/uL — ABNORMAL HIGH (ref 200–950)
Monocytes Relative: 11 %
Neutro Abs: 7797 cells/uL (ref 1500–7800)
Neutrophils Relative %: 69 %
Platelets: 516 10*3/uL — ABNORMAL HIGH (ref 140–400)
RBC: 4.02 MIL/uL (ref 3.80–5.10)
RDW: 13.2 % (ref 11.0–15.0)
WBC: 11.3 10*3/uL — ABNORMAL HIGH (ref 3.8–10.8)

## 2016-03-07 LAB — HEPATIC FUNCTION PANEL
ALK PHOS: 102 U/L (ref 33–130)
ALT: 22 U/L (ref 6–29)
AST: 22 U/L (ref 10–35)
Albumin: 3.8 g/dL (ref 3.6–5.1)
BILIRUBIN DIRECT: 0.1 mg/dL (ref <=0.2)
BILIRUBIN INDIRECT: 0.4 mg/dL (ref 0.2–1.2)
TOTAL PROTEIN: 6.8 g/dL (ref 6.1–8.1)
Total Bilirubin: 0.5 mg/dL (ref 0.2–1.2)

## 2016-03-07 LAB — BASIC METABOLIC PANEL
BUN: 11 mg/dL (ref 7–25)
CALCIUM: 9 mg/dL (ref 8.6–10.4)
CO2: 24 mmol/L (ref 20–31)
CREATININE: 0.87 mg/dL (ref 0.50–1.05)
Chloride: 100 mmol/L (ref 98–110)
GLUCOSE: 112 mg/dL — AB (ref 65–99)
Potassium: 4.5 mmol/L (ref 3.5–5.3)
Sodium: 134 mmol/L — ABNORMAL LOW (ref 135–146)

## 2016-03-07 NOTE — Addendum Note (Signed)
Addended byEulas Post, Clarabel Marion on: 03/07/2016 09:27 AM   Modules accepted: Level of Service

## 2016-03-07 NOTE — Patient Instructions (Addendum)
Medication Instructions:  Your physician recommends that you continue on your current medications as directed. Please refer to the Current Medication list given to you today.   Labwork: Your physician recommends that you have lab work today: bmet/cbc/lft   Testing/Procedures: -None  Follow-Up: Follow up with Primary Care Doctor in 1-2 weeks Will send Dr. Evette Georges nurse a message to get appointment for 4 week f/u.  If you do not here from office in 5 days please call office to follow up on appointment at [phone].  You have been referred to Cardiac Rehab, someone from rehab will call to set up appointment.    Any Other Special Instructions Will Be Listed Below (If Applicable).  Patient was given a work note today, will return to work on 03/24/2016   If you need a refill on your cardiac medications before your next appointment, please call your pharmacy.

## 2016-03-12 ENCOUNTER — Ambulatory Visit: Payer: Self-pay | Admitting: Family Medicine

## 2016-03-13 DIAGNOSIS — I2510 Atherosclerotic heart disease of native coronary artery without angina pectoris: Secondary | ICD-10-CM | POA: Insufficient documentation

## 2016-03-14 ENCOUNTER — Encounter: Payer: Self-pay | Admitting: Cardiovascular Disease

## 2016-03-14 ENCOUNTER — Telehealth: Payer: Self-pay | Admitting: Cardiovascular Disease

## 2016-03-14 NOTE — Telephone Encounter (Signed)
F/U  Pt needs appt w/Kelly post hospital visit. I offered an appt with an APP and pt refused stating she only wants to see Dr. Claiborne Billings.

## 2016-03-17 NOTE — Telephone Encounter (Signed)
Follow up   Pt verbalized that she has some concerns that she would like the rn to call her back about  Its the fact that she has not heard back from the rn  And she said if Dr.Kelly wont see her that she will need him to give her a referral to see another cardiologist and not a PA  Pt is expecting atoday

## 2016-03-17 NOTE — Telephone Encounter (Signed)
Spoke with pt, aware we will need to get direction from dr Claiborne Billings regarding a follow up appointment. Explained will call back once dr Claiborne Billings responds. Pt agreed with this plan.

## 2016-03-21 ENCOUNTER — Telehealth: Payer: Self-pay | Admitting: Cardiovascular Disease

## 2016-03-21 NOTE — Telephone Encounter (Signed)
New Message  Pt call requesting to speak with RN about making an appt sooner that avail for Dr. Claiborne Billings. Pt states she was told by Isaac Laud to scheudule within next 4 weeks. Please call back to discuss

## 2016-03-24 NOTE — Telephone Encounter (Signed)
Follow-up ° ° ° ° °The pt is returning the nurses call °

## 2016-03-24 NOTE — Telephone Encounter (Signed)
I discussed with Dalene Seltzer today; arrangements made for appt with me.

## 2016-03-25 NOTE — Telephone Encounter (Signed)
Unsure how triage would be able to assist patient in getting an appointment with Dr. Claiborne Billings...this is a scheduling concern. Per H. Meng's note 7/7, he was going to send a message to Dr. Evette Georges nurse to see about getting a patient a 4 week follow up .Marland Kitchen Is there anything that can be done to assist this patient? Thanks.

## 2016-03-25 NOTE — Telephone Encounter (Signed)
Follow-up       The pt is calling back to speak with a nurse from yesterday no-one called her yesterday she's upset that no-one has returned her phone call, she would like to speak with someone today please.    No other information provided.

## 2016-03-25 NOTE — Telephone Encounter (Signed)
Follow-uo      The pt wants her records transferred to [facility] in [place].     She is up-set no-one is communicating with her.  She feels she would get better care from the Clinic in [place] and seen when she needs to.   She says her primary care is there in [place] and would like for everything to be sent there.  She would like for someone to please call her back today, So she knows her records have been sent.

## 2016-04-01 ENCOUNTER — Emergency Department: Payer: BC Managed Care – PPO

## 2016-04-01 ENCOUNTER — Inpatient Hospital Stay
Admission: EM | Admit: 2016-04-01 | Discharge: 2016-04-02 | DRG: 392 | Disposition: A | Discharge status: Home / Self Care | Payer: BC Managed Care – PPO | Attending: Internal Medicine | Admitting: Internal Medicine

## 2016-04-01 ENCOUNTER — Encounter: Payer: Self-pay | Admitting: Emergency Medicine

## 2016-04-01 DIAGNOSIS — Z87891 Personal history of nicotine dependence: Secondary | ICD-10-CM | POA: Diagnosis not present

## 2016-04-01 DIAGNOSIS — L270 Generalized skin eruption due to drugs and medicaments taken internally: Secondary | ICD-10-CM | POA: Diagnosis not present

## 2016-04-01 DIAGNOSIS — Z888 Allergy status to other drugs, medicaments and biological substances status: Secondary | ICD-10-CM

## 2016-04-01 DIAGNOSIS — Z955 Presence of coronary angioplasty implant and graft: Secondary | ICD-10-CM

## 2016-04-01 DIAGNOSIS — R079 Chest pain, unspecified: Secondary | ICD-10-CM

## 2016-04-01 DIAGNOSIS — Z7982 Long term (current) use of aspirin: Secondary | ICD-10-CM

## 2016-04-01 DIAGNOSIS — Y92239 Unspecified place in hospital as the place of occurrence of the external cause: Secondary | ICD-10-CM | POA: Diagnosis not present

## 2016-04-01 DIAGNOSIS — I200 Unstable angina: Secondary | ICD-10-CM | POA: Diagnosis present

## 2016-04-01 DIAGNOSIS — I255 Ischemic cardiomyopathy: Secondary | ICD-10-CM | POA: Diagnosis present

## 2016-04-01 DIAGNOSIS — I2510 Atherosclerotic heart disease of native coronary artery without angina pectoris: Secondary | ICD-10-CM | POA: Diagnosis present

## 2016-04-01 DIAGNOSIS — K219 Gastro-esophageal reflux disease without esophagitis: Secondary | ICD-10-CM | POA: Diagnosis present

## 2016-04-01 DIAGNOSIS — Z91040 Latex allergy status: Secondary | ICD-10-CM | POA: Diagnosis not present

## 2016-04-01 DIAGNOSIS — Z8249 Family history of ischemic heart disease and other diseases of the circulatory system: Secondary | ICD-10-CM

## 2016-04-01 DIAGNOSIS — T50995A Adverse effect of other drugs, medicaments and biological substances, initial encounter: Secondary | ICD-10-CM | POA: Diagnosis not present

## 2016-04-01 DIAGNOSIS — I252 Old myocardial infarction: Secondary | ICD-10-CM | POA: Diagnosis not present

## 2016-04-01 DIAGNOSIS — Z885 Allergy status to narcotic agent status: Secondary | ICD-10-CM

## 2016-04-01 LAB — COMPREHENSIVE METABOLIC PANEL
ALBUMIN: 4.4 g/dL (ref 3.5–5.0)
ALT: 21 U/L (ref 14–54)
ANION GAP: 8 (ref 5–15)
AST: 25 U/L (ref 15–41)
Alkaline Phosphatase: 97 U/L (ref 38–126)
BUN: 10 mg/dL (ref 6–20)
CHLORIDE: 104 mmol/L (ref 101–111)
CO2: 24 mmol/L (ref 22–32)
Calcium: 9.4 mg/dL (ref 8.9–10.3)
Creatinine, Ser: 0.93 mg/dL (ref 0.44–1.00)
GFR calc non Af Amer: >60 mL/min (ref >60)
GLUCOSE: 123 mg/dL — AB (ref 65–99)
Potassium: 4.1 mmol/L (ref 3.5–5.1)
SODIUM: 136 mmol/L (ref 135–145)
Total Bilirubin: 0.6 mg/dL (ref 0.3–1.2)
Total Protein: 8.7 g/dL — ABNORMAL HIGH (ref 6.5–8.1)

## 2016-04-01 LAB — TROPONIN I: Troponin I: <0.03 ng/mL (ref <0.03)

## 2016-04-01 LAB — CBC WITH DIFFERENTIAL/PLATELET
BASOS PCT: 1 %
Basophils Absolute: 0.1 10*3/uL (ref 0–0.1)
EOS ABS: 0.3 10*3/uL (ref 0–0.7)
EOS PCT: 3 %
HCT: 39.8 % (ref 35.0–47.0)
HEMOGLOBIN: 14.2 g/dL (ref 12.0–16.0)
LYMPHS ABS: 2.1 10*3/uL (ref 1.0–3.6)
Lymphocytes Relative: 16 %
MCH: 33.6 pg (ref 26.0–34.0)
MCHC: 35.6 g/dL (ref 32.0–36.0)
MCV: 94.5 fL (ref 80.0–100.0)
MONOS PCT: 12 %
Monocytes Absolute: 1.6 10*3/uL — ABNORMAL HIGH (ref 0.2–0.9)
NEUTROS PCT: 68 %
Neutro Abs: 9.2 10*3/uL — ABNORMAL HIGH (ref 1.4–6.5)
PLATELETS: 307 10*3/uL (ref 150–440)
RBC: 4.22 MIL/uL (ref 3.80–5.20)
RDW: 13.2 % (ref 11.5–14.5)
WBC: 13.2 10*3/uL — AB (ref 3.6–11.0)

## 2016-04-01 LAB — APTT: APTT: 26 s (ref 24–36)

## 2016-04-01 LAB — PROTIME-INR
INR: 0.99
PROTHROMBIN TIME: 13.1 s (ref 11.4–15.2)

## 2016-04-01 LAB — MRSA PCR SCREENING: MRSA by PCR: NEGATIVE

## 2016-04-01 LAB — HEPARIN LEVEL (UNFRACTIONATED): HEPARIN UNFRACTIONATED: 0.14 [IU]/mL — AB (ref 0.30–0.70)

## 2016-04-01 LAB — GLUCOSE, CAPILLARY: GLUCOSE-CAPILLARY: 113 mg/dL — AB (ref 65–99)

## 2016-04-01 MED ORDER — CARVEDILOL 3.125 MG PO TABS: 1.5600 mg | ORAL_TABLET | Freq: Two times a day (BID) | ORAL | Status: DC

## 2016-04-01 MED ORDER — ATORVASTATIN CALCIUM 20 MG PO TABS
80.0000 mg | ORAL_TABLET | Freq: Every day | ORAL | Status: DC
  Administered 2016-04-01: 20 mg via ORAL
  Filled 2016-04-01: qty 4

## 2016-04-01 MED ORDER — ASPIRIN 81 MG PO CHEW
CHEWABLE_TABLET | ORAL | Status: AC
  Filled 2016-04-01: qty 4

## 2016-04-01 MED ORDER — PRASUGREL HCL 10 MG PO TABS
10.0000 mg | ORAL_TABLET | Freq: Every day | ORAL | Status: DC
  Administered 2016-04-02: 10 mg via ORAL
  Filled 2016-04-01 (×2): qty 1

## 2016-04-01 MED ORDER — TRAMADOL HCL 50 MG PO TABS
ORAL_TABLET | ORAL | Status: AC
  Administered 2016-04-01: 50 mg via ORAL
  Filled 2016-04-01: qty 1

## 2016-04-01 MED ORDER — ASPIRIN 81 MG PO CHEW
324.0000 mg | CHEWABLE_TABLET | Freq: Once | ORAL | Status: AC
  Administered 2016-04-01: 324 mg via ORAL
  Filled 2016-04-01: qty 4

## 2016-04-01 MED ORDER — SODIUM CHLORIDE 0.9 % IV SOLN
INTRAVENOUS | Status: DC
  Administered 2016-04-01: 15:00:00 via INTRAVENOUS

## 2016-04-01 MED ORDER — MORPHINE SULFATE (PF) 2 MG/ML IV SOLN
2.0000 mg | INTRAVENOUS | Status: DC | PRN
  Administered 2016-04-01 (×2): 2 mg via INTRAVENOUS
  Administered 2016-04-01 – 2016-04-02 (×2): 4 mg via INTRAVENOUS
  Filled 2016-04-01: qty 2
  Filled 2016-04-01 (×2): qty 1
  Filled 2016-04-01: qty 2

## 2016-04-01 MED ORDER — ACETAMINOPHEN 500 MG PO TABS: 500.0000 mg | ORAL_TABLET | Freq: Four times a day (QID) | ORAL | Status: DC | PRN

## 2016-04-01 MED ORDER — TRAMADOL HCL 50 MG PO TABS
50.0000 mg | ORAL_TABLET | Freq: Four times a day (QID) | ORAL | Status: DC
  Administered 2016-04-01: 50 mg via ORAL

## 2016-04-01 MED ORDER — TRAMADOL HCL 50 MG PO TABS
50.0000 mg | ORAL_TABLET | Freq: Four times a day (QID) | ORAL | Status: DC
  Administered 2016-04-01 – 2016-04-02 (×3): 50 mg via ORAL
  Filled 2016-04-01 (×3): qty 1

## 2016-04-01 MED ORDER — CARVEDILOL 3.125 MG PO TABS
1.5600 mg | ORAL_TABLET | Freq: Two times a day (BID) | ORAL | Status: DC
  Administered 2016-04-01 – 2016-04-02 (×2): 1.56 mg via ORAL
  Filled 2016-04-01 (×2): qty 1

## 2016-04-01 MED ORDER — FAMOTIDINE IN NACL 20-0.9 MG/50ML-% IV SOLN
20.0000 mg | Freq: Two times a day (BID) | INTRAVENOUS | Status: DC
  Administered 2016-04-01 (×2): 20 mg via INTRAVENOUS
  Filled 2016-04-01 (×4): qty 50

## 2016-04-01 MED ORDER — NITROGLYCERIN IN D5W 200-5 MCG/ML-% IV SOLN
5.0000 ug/min | Freq: Once | INTRAVENOUS | Status: AC
  Administered 2016-04-01: 5 ug/min via INTRAVENOUS
  Filled 2016-04-01: qty 250

## 2016-04-01 MED ORDER — MORPHINE SULFATE (PF) 4 MG/ML IV SOLN: 4.0000 mg | INTRAVENOUS | Status: DC | PRN

## 2016-04-01 MED ORDER — ASPIRIN EC 325 MG PO TBEC
325.0000 mg | DELAYED_RELEASE_TABLET | Freq: Two times a day (BID) | ORAL | Status: DC
  Administered 2016-04-01 – 2016-04-02 (×2): 325 mg via ORAL
  Filled 2016-04-01 (×2): qty 1

## 2016-04-01 MED ORDER — HEPARIN (PORCINE) IN NACL 100-0.45 UNIT/ML-% IJ SOLN
1100.0000 [IU]/h | INTRAMUSCULAR | Status: DC
  Administered 2016-04-01: 850 [IU]/h via INTRAVENOUS
  Administered 2016-04-02: 1100 [IU]/h via INTRAVENOUS
  Filled 2016-04-01 (×2): qty 250

## 2016-04-01 MED ORDER — OXYCODONE HCL 5 MG PO TABS
5.0000 mg | ORAL_TABLET | ORAL | Status: DC | PRN
  Administered 2016-04-01 – 2016-04-02 (×2): 5 mg via ORAL
  Filled 2016-04-01 (×2): qty 1

## 2016-04-01 MED ORDER — HEPARIN BOLUS VIA INFUSION
2500.0000 [IU] | Freq: Once | INTRAVENOUS | Status: AC
  Administered 2016-04-01: 2500 [IU] via INTRAVENOUS
  Filled 2016-04-01: qty 2500

## 2016-04-01 MED ORDER — ASPIRIN 81 MG PO TBEC: 81.0000 mg | DELAYED_RELEASE_TABLET | Freq: Every day | ORAL | Status: DC

## 2016-04-01 MED ORDER — SODIUM CHLORIDE 0.9% FLUSH
3.0000 mL | Freq: Two times a day (BID) | INTRAVENOUS | Status: DC
  Administered 2016-04-01 – 2016-04-02 (×3): 3 mL via INTRAVENOUS

## 2016-04-01 MED ORDER — HEPARIN BOLUS VIA INFUSION
4000.0000 [IU] | Freq: Once | INTRAVENOUS | Status: AC
  Administered 2016-04-01: 4000 [IU] via INTRAVENOUS
  Filled 2016-04-01: qty 4000

## 2016-04-01 NOTE — Progress Notes (Signed)
ANTICOAGULATION CONSULT NOTE - Initial Consult  Pharmacy Consult for Heparin Drip Indication: chest pain/ACS  Allergies  Allergen Reactions  . Latex Dermatitis  . Elastic Bandages & [Zinc] Rash  . Oxycodone Anxiety and Other (See Comments)    Patient Measurements: Height: 5\' 3"  (160 cm) Weight: 188 lb (85.3 kg) IBW/kg (Calculated) : 52.4 Heparin Dosing Weight: 71.4 kg  Vital Signs: Temp: 98 F (36.7 C) (08/01 0830) Temp Source: Oral (08/01 0830) BP: 95/69 (08/01 1147) Pulse Rate: 89 (08/01 1147)  Labs:  Recent Labs  04/01/16 0846  HGB 14.2  HCT 39.8  PLT 307  CREATININE 0.93  TROPONINI <0.03    Estimated Creatinine Clearance: 71.6 mL/min (by C-G formula based on SCr of 0.93 mg/dL).   Medical History: Past Medical History:  Diagnosis Date  . ST elevation myocardial infarction (STEMI) of anterior wall, initial episode of care ([place])   . Tobacco abuse     Medications:  Scheduled:   Infusions:  . heparin 850 Units/hr (04/01/16 1141)    Assessment: Pharmacy consulted to dose and manage Heparin drip for this 55 y.o. female who is complaining of left substernal chest pain that started about 8:30 last night.   Goal of Therapy:  Heparin level 0.3-0.7 units/ml Monitor platelets by anticoagulation protocol: Yes   Plan:  Give 4000 units bolus x 1 Start heparin infusion at 850 units/hr Check anti-Xa level in 6 hours and daily while on heparin Continue to monitor H&H and platelets   Heparin level ordered for 8/1 at 18:00.  [place] Clinical Pharmacist 04/01/2016,12:00 PM

## 2016-04-01 NOTE — ED Notes (Addendum)
Rate titrated to 83mcg/min. Patient reports pain going from zero to 6 out of 10 now

## 2016-04-01 NOTE — ED Notes (Signed)
MD at bedside. 

## 2016-04-01 NOTE — ED Notes (Signed)
Nitro drip currently at lowest rate.  Spoke with admitting MD in regards to BP parameters; advised to stop for systolic BP 90.

## 2016-04-01 NOTE — Progress Notes (Signed)
RN spoke with Dr. Margaretmary Eddy and made MD aware that patient's blood pressure is 102/78 and patient continues to complain of sharp mid chest pain.  Morphine ordered but RN concerned that 4mg  is too much for one dose considering blood pressure is on lower side although patient states 102/78 is a normal blood pressure for her. MD gave order to change morphine order to range dose of 2-4 mg.

## 2016-04-01 NOTE — H&P (Signed)
[place] [facility] [place] NAME: Erika Rios    MR#:  [id number]  DATE OF BIRTH:  18-Aug-1961  DATE OF ADMISSION:  04/01/2016  PRIMARY CARE PHYSICIAN: Tracie Harrier, MD   REQUESTING/REFERRING PHYSICIAN:Linda Geni Bers, MD  CHIEF COMPLAINT:   Crushing chest pain HISTORY OF PRESENT ILLNESS:  Erika Rios  is a 55 y.o. female with a known history of Recent STEMI status post cardiac stent placement at [facility] approximately 8 weeks ago, on aspirin and effient is presenting to the ED with a chief complaint of the crushing chest pain which was started last night. Patient took one sublingual nitroglycerin and the chest pain was slightly better and she fell asleep. Again she woke up this morning with severe chest pain and came into the ED. Initial troponin is negative. Patient is started on nitroglycerin drip in the ED patient also heparin drip was started. Patient denies any shortness of breath. Reports that her midsternal chest pain is radiating to the left shoulder. Denies any dizziness or loss of consciousness. Denies any nausea vomiting setting of recent cold symptoms. No sick contacts. ED physician has discussed with on-call cardiologist Dr. Lorinda Creed  PAST MEDICAL HISTORY:   Past Medical History:  Diagnosis Date  . ST elevation myocardial infarction (STEMI) of anterior wall, initial episode of care ([place])   . Tobacco abuse     PAST SURGICAL HISTOIRY:   Past Surgical History:  Procedure Laterality Date  . CARDIAC CATHETERIZATION N/A 02/24/2016   Procedure: Left Heart Cath and Coronary Angiography;  Surgeon: Troy Sine, MD;  Location: [place] [facility];  Service: Cardiovascular;  Laterality: N/A;  . CARDIAC CATHETERIZATION N/A 02/24/2016   Procedure: Coronary Stent Intervention;  Surgeon: Troy Sine, MD;  Location: [place] [facility];  Service: Cardiovascular;  Laterality: N/A;    SOCIAL HISTORY:   Social History   Substance Use Topics  . Smoking status: Former Smoker    Packs/day: 0.50    Years: 34.00    Types: Cigarettes  . Smokeless tobacco: Never Used  . Alcohol use No    FAMILY HISTORY:   Family History  Problem Relation Age of Onset  . Cancer Mother   . Heart disease Mother   . Heart attack Mother   . Diabetes Father     DRUG ALLERGIES:   Allergies  Allergen Reactions  . Latex Dermatitis  . Elastic Bandages & [Zinc] Rash  . Oxycodone Anxiety and Other (See Comments)    REVIEW OF SYSTEMS:  CONSTITUTIONAL: No fever, fatigue or weakness.  EYES: No blurred or double vision.  EARS, NOSE, AND THROAT: No tinnitus or ear pain.  RESPIRATORY: No cough, shortness of breath, wheezing or hemoptysis.  CARDIOVASCULAR: Reporting midsternal chest pain radiating to the left shoulder, orthopnea, edema.  GASTROINTESTINAL: No nausea, vomiting, diarrhea or abdominal pain.  GENITOURINARY: No dysuria, hematuria.  ENDOCRINE: No polyuria, nocturia,  HEMATOLOGY: No anemia, easy bruising or bleeding SKIN: No rash or lesion. MUSCULOSKELETAL: No joint pain or arthritis.   NEUROLOGIC: No tingling, numbness, weakness.  PSYCHIATRY: No anxiety or depression.   MEDICATIONS AT HOME:   Prior to Admission medications   Medication Sig Start Date End Date Taking? Authorizing Provider  acetaminophen (TYLENOL) 500 MG tablet Take 500 mg by mouth every 6 (six) hours as needed for mild pain.   Yes Historical Provider, MD  aspirin EC 81 MG EC tablet Take 1 tablet (81 mg total) by mouth daily. 02/28/16  Yes Bhavinkumar Bhagat, PA  atorvastatin (LIPITOR) 80 MG tablet Take 1 tablet (80 mg total) by mouth daily at 6 PM. 02/28/16  Yes Bhavinkumar Bhagat, PA  carvedilol (COREG) 3.125 MG tablet Take 0.5 tablets (1.56 mg total) by mouth 2 (two) times daily with a meal. 02/28/16  Yes Bhavinkumar Bhagat, PA  lisinopril (PRINIVIL,ZESTRIL) 2.5 MG tablet Take 1 tablet (2.5 mg total) by mouth daily. 02/28/16  Yes Bhavinkumar  Bhagat, PA  nitroGLYCERIN (NITROSTAT) 0.4 MG SL tablet Place 1 tablet (0.4 mg total) under the tongue every 5 (five) minutes x 3 doses as needed for chest pain. 02/28/16  Yes Bhavinkumar Bhagat, PA  prasugrel (EFFIENT) 10 MG TABS tablet Take 1 tablet (10 mg total) by mouth daily. 03/06/16  Yes Mihai Croitoru, MD  spironolactone (ALDACTONE) 25 MG tablet Take 0.5 tablets (12.5 mg total) by mouth daily. 02/28/16  Yes Bhavinkumar Bhagat, PA      VITAL SIGNS:  Blood pressure 103/76, pulse 86, temperature 98 F (36.7 C), temperature source Oral, resp. rate 17, height 5\' 3"  (1.6 m), weight 85.3 kg (188 lb), SpO2 99 %.  PHYSICAL EXAMINATION:  GENERAL:  55 y.o.-year-old patient lying in the bed with no acute distress.  EYES: Pupils equal, round, reactive to light and accommodation. No scleral icterus. Extraocular muscles intact.  HEENT: Head atraumatic, normocephalic. Oropharynx and nasopharynx clear.  NECK:  Supple, no jugular venous distention. No thyroid enlargement, no tenderness.  LUNGS: Normal breath sounds bilaterally, no wheezing, rales,rhonchi or crepitation. No use of accessory muscles of respiration.  CARDIOVASCULAR: S1, S2 normal. No murmurs, rubs, or gallops. Reproducible midsternal tenderness is present ABDOMEN: Soft, nontender, nondistended. Bowel sounds present. No organomegaly or mass.  EXTREMITIES: No pedal edema, cyanosis, or clubbing.  NEUROLOGIC: Cranial nerves II through XII are intact. Muscle strength 5/5 in all extremities. Sensation intact. Gait not checked.  PSYCHIATRIC: The patient is alert and oriented x 3.  SKIN: No obvious rash, lesion, or ulcer.   LABORATORY PANEL:   CBC  Recent Labs Lab 04/01/16 0846  WBC 13.2*  HGB 14.2  HCT 39.8  PLT 307   ------------------------------------------------------------------------------------------------------------------  Chemistries   Recent Labs Lab 04/01/16 0846  NA 136  K 4.1  CL 104  CO2 24  GLUCOSE 123*  BUN 10   CREATININE 0.93  CALCIUM 9.4  AST 25  ALT 21  ALKPHOS 97  BILITOT 0.6   ------------------------------------------------------------------------------------------------------------------  Cardiac Enzymes  Recent Labs Lab 04/01/16 0846  TROPONINI <0.03   ------------------------------------------------------------------------------------------------------------------  RADIOLOGY:  Dg Chest 1 View  Result Date: 04/01/2016 CLINICAL DATA:  Chest pain for 2 weeks EXAM: CHEST 1 VIEW COMPARISON:  February 24, 2016 FINDINGS: Lungs are clear. Heart size and pulmonary vascularity are normal. No adenopathy. No pneumothorax. No bone lesions. IMPRESSION: No edema or consolidation. Electronically Signed   By: Lowella Grip III M.D.   On: 04/01/2016 09:57    EKG:   Orders placed or performed during the hospital encounter of 04/01/16  . ED EKG  . ED EKG  . EKG 12-Lead  . EKG 12-Lead    IMPRESSION AND PLAN:   Erika Rios  is a 55 y.o. female with a known history of Recent STEMI status post cardiac stent placement at [facility] approximately 8 weeks ago, on aspirin and effient is presenting to the ED with a chief complaint of the crushing chest pain which was started last night. Patient took one sublingual nitroglycerin and the chest pain was slightly better and she fell  asleep. Again she woke up this morning with severe chest pain and came into the ED. Initial troponin is negative. Patient is started on nitroglycerin drip in the ED patient also heparin drip was started.  #Unstable angina with recent history of STEMI status post coronary artery stent placement 2 months ago  Continue nitroglycerin and heparin drip Continue aspirin and EFFIENT Cycle troponins Urgent consult is placed to [facility] cardiology and D/W dr.Parashos 12-lead EKG with T-wave inversions Check fasting lipid panel. Continue statin  #History of ischemic cardiomyopathy Obtain echocardiogram to evaluate patient's  ejection fraction  #History of coronary artery disease Continue aspirin, Effient and statin Continue Coreg with holding parameter  #History of Tobacco abuse disorder  patient  quit smoking  Provide GI prophylaxis. DVT prophylaxis with heparin drip   All the records are reviewed and case discussed with ED provider. Management plans discussed with the patient, husband and they are in agreement.  CODE STATUS: FC/Husband is the healthcare power of attorney  TOTAL CRITICAL TIME TAKING CARE OF THIS PATIENT: 50 minutes.   Note: This dictation was prepared with Dragon dictation along with smaller phrase technology. Any transcriptional errors that result from this process are unintentional.  Nicholes Mango M.D on 04/01/2016 at 12:58 PM  Between 7am to 6pm - Pager - [phone]  After 6pm go to [email or web address] - password [email or web address] [email or web address]  Office  [phone]  CC: Primary care physician; Tracie Harrier, MD

## 2016-04-01 NOTE — ED Provider Notes (Addendum)
[facility] Emergency Department Provider Note  ___________________________________________   None    (approximate)  I have reviewed the triage vital signs and the nursing notes.   HISTORY  Chief Complaint Chest Pain  HPI Erika Rios is a 55 y.o. female who is complaining of left substernal chest pain that started about 8:30 last night. Patient states that she took a nitroglycerin and had some relief. Patient went to bed, but when she woke up this morning her pain was much worse. Patient had a heart attack in June and had one stent placed. Patient states that she has not had a follow-up appointment since then. Patient stent was placed at [facility] but she has since made an appointment with Dr. Nehemiah Massed here at [facility], but her appointment is not until this Thursday. Patient states she took 2 nitroglycerin this morning and it minimally relieved her pain but it will come right back. Patient feels like there is "something wrong with her stent ". Patient denies any nausea, vomiting, sweating, or recent cough or cold symptoms. Patient states that she has continued to have some mild dyspnea on exertion since her stent placement. Patient is currently on Effient for her stent. Patient states her pain on a scale of 0-10 is a 6.   Past Medical History:  Diagnosis Date  . ST elevation myocardial infarction (STEMI) of anterior wall, initial episode of care ([place])   . Tobacco abuse     Patient Active Problem List   Diagnosis Date Noted  . Status post coronary artery stent placement   . Cardiomyopathy, ischemic   . STEMI (ST elevation myocardial infarction) ([place]) 02/24/2016  . Tobacco abuse   . ST elevation myocardial infarction (STEMI) of anterior wall, initial episode of care ([place])   . ST elevation myocardial infarction involving left anterior descending (LAD) coronary artery [facility])     Past Surgical History:  Procedure Laterality Date  . CARDIAC  CATHETERIZATION N/A 02/24/2016   Procedure: Left Heart Cath and Coronary Angiography;  Surgeon: Troy Sine, MD;  Location: [place] [facility];  Service: Cardiovascular;  Laterality: N/A;  . CARDIAC CATHETERIZATION N/A 02/24/2016   Procedure: Coronary Stent Intervention;  Surgeon: Troy Sine, MD;  Location: [place] [facility];  Service: Cardiovascular;  Laterality: N/A;    Prior to Admission medications   Medication Sig Start Date End Date Taking? Authorizing Provider  acetaminophen (TYLENOL) 500 MG tablet Take 500 mg by mouth every 6 (six) hours as needed for mild pain.   Yes Historical Provider, MD  aspirin EC 81 MG EC tablet Take 1 tablet (81 mg total) by mouth daily. 02/28/16  Yes Bhavinkumar Bhagat, PA  atorvastatin (LIPITOR) 80 MG tablet Take 1 tablet (80 mg total) by mouth daily at 6 PM. 02/28/16  Yes Bhavinkumar Bhagat, PA  carvedilol (COREG) 3.125 MG tablet Take 0.5 tablets (1.56 mg total) by mouth 2 (two) times daily with a meal. 02/28/16  Yes Bhavinkumar Bhagat, PA  lisinopril (PRINIVIL,ZESTRIL) 2.5 MG tablet Take 1 tablet (2.5 mg total) by mouth daily. 02/28/16  Yes Bhavinkumar Bhagat, PA  nitroGLYCERIN (NITROSTAT) 0.4 MG SL tablet Place 1 tablet (0.4 mg total) under the tongue every 5 (five) minutes x 3 doses as needed for chest pain. 02/28/16  Yes Bhavinkumar Bhagat, PA  prasugrel (EFFIENT) 10 MG TABS tablet Take 1 tablet (10 mg total) by mouth daily. 03/06/16  Yes Mihai Croitoru, MD  spironolactone (ALDACTONE) 25 MG tablet Take 0.5 tablets (12.5  mg total) by mouth daily. 02/28/16  Yes Leanor Kail, PA    Allergies Latex; Elastic bandages & [zinc]; and Oxycodone  Family History  Problem Relation Age of Onset  . Cancer Mother   . Heart disease Mother   . Heart attack Mother   . Diabetes Father     Social History Social History  Substance Use Topics  . Smoking status: Former Smoker    Packs/day: 0.50    Years: 34.00    Types: Cigarettes  . Smokeless tobacco: Never  Used  . Alcohol use No    Review of Systems Constitutional: No fever/chills Eyes: No visual changes. ENT: No sore throat. Cardiovascular: Positive for chest pain, left substernal, off and on since 8:30 last night.Marland Kitchen Respiratory: Denies shortness of breath. Gastrointestinal: No abdominal pain.  No nausea, no vomiting.  No diarrhea.  No constipation. Genitourinary: Negative for dysuria. Musculoskeletal: Negative for back pain. Skin: Negative for rash. Neurological: Negative for headaches, focal weakness or numbness.  10-point ROS otherwise negative.  ____________________________________________   PHYSICAL EXAM:  VITAL SIGNS: ED Triage Vitals  Enc Vitals Group     BP 04/01/16 0830 124/84     Pulse Rate 04/01/16 0830 97     Resp 04/01/16 0830 18     Temp 04/01/16 0830 98 F (36.7 C)     Temp Source 04/01/16 0830 Oral     SpO2 04/01/16 0830 99 %     Weight 04/01/16 0830 188 lb (85.3 kg)     Height 04/01/16 0830 5\' 3"  (1.6 m)     Head Circumference --      Peak Flow --      Pain Score 04/01/16 0827 8     Pain Loc --      Pain Edu? --      Excl. in [place]? --     Constitutional: Alert and oriented. Well appearing and in Mild acute distress, from her persistent chest pain. Eyes: Conjunctivae are normal. PERRL. EOMI. Head: Atraumatic. Nose: No congestion/rhinnorhea. Mouth/Throat: Mucous membranes are moist.  Oropharynx non-erythematous. Neck: No stridor.   Cardiovascular: Normal rate, regular rhythm. Grossly normal heart sounds.  Good peripheral circulation. Respiratory: Normal respiratory effort.  No retractions. Lungs CTAB. Gastrointestinal: Soft and nontender. No distention. No abdominal bruits. No CVA tenderness. Musculoskeletal: No lower extremity tenderness nor edema.  No joint effusions. Neurologic:  Normal speech and language. No gross focal neurologic deficits are appreciated. No gait instability. Skin:  Skin is warm, dry and intact. No rash noted. Psychiatric: Mood  and affect are normal. Speech and behavior are normal.  ____________________________________________   LABS (all labs ordered are listed, but only abnormal results are displayed)  Labs Reviewed  CBC WITH DIFFERENTIAL/PLATELET - Abnormal; Notable for the following:       Result Value   WBC 13.2 (*)    Neutro Abs 9.2 (*)    Monocytes Absolute 1.6 (*)    All other components within normal limits  COMPREHENSIVE METABOLIC PANEL - Abnormal; Notable for the following:    Glucose, Bld 123 (*)    Total Protein 8.7 (*)    All other components within normal limits  TROPONIN I  PROTIME-INR   ____________________________________________  EKG ED ECG REPORT I, Ruby Cola, the attending physician, personally viewed and interpreted this ECG.   Date: 04/01/2016  EKG Time: 0828  Rate: 89  Rhythm: normal sinus rhythm  Axis: Normal  Intervals:none  ST&T Change: Poor R-wave progression and flipped T waves inferiorly  and laterally   ____________________________________________  RADIOLOGY  Dg Chest 1 View  Result Date: 04/01/2016 CLINICAL DATA:  Chest pain for 2 weeks EXAM: CHEST 1 VIEW COMPARISON:  February 24, 2016 FINDINGS: Lungs are clear. Heart size and pulmonary vascularity are normal. No adenopathy. No pneumothorax. No bone lesions. IMPRESSION: No edema or consolidation. Electronically Signed   By: Lowella Grip III M.D.   On: 04/01/2016 09:57    ____________________________________________   PROCEDURES  Procedure(s) performed: None  Procedures  Critical Care performed: Yes, see critical care note(s) CRITICAL CARE Performed by: Ruby Cola   Total critical care time: 45 minutes  Critical care time was exclusive of separately billable procedures and treating other patients.  Critical care was necessary to treat or prevent imminent or life-threatening deterioration.  Critical care was time spent personally by me on the following activities: development of  treatment plan with patient and/or surrogate as well as nursing, discussions with consultants, evaluation of patient's response to treatment, examination of patient, obtaining history from patient or surrogate, ordering and performing treatments and interventions, ordering and review of laboratory studies, ordering and review of radiographic studies, pulse oximetry and re-evaluation of patient's condition.  ____________________________________________   INITIAL IMPRESSION / ASSESSMENT AND PLAN / ED COURSE  Pertinent labs & imaging results that were available during my care of the patient were reviewed by me and considered in my medical decision making (see chart for details). 10:47 AM Patient will be started on an IV nitroglycerin infusion as well as given aspirin in the ER. Initial cardiac enzymes and chest x-ray have been ordered.  10:47 AM Patient had some relief with IV nitroglycerin and it has been continued as long as her pressure hold. Dr. Josefa Half shows from cardiology was consulted to just evaluate the patient was using the hospital. The hospitalist was consulted for admission.  10:47 AM Pt will be also started on IV heparin infusion for possible stenosis of her stent.  Clinical Course     ____________________________________________   FINAL CLINICAL IMPRESSION(S) / ED DIAGNOSES  Final diagnoses:  Chest pain  Acute chest pain  Unstable angina (HCC)      NEW MEDICATIONS STARTED DURING THIS VISIT:  New Prescriptions   No medications on file     Note:  This document was prepared using Dragon voice recognition software and may include unintentional dictation errors.     Ruby Cola, MD 04/01/16 Amado Ayriel [place], MD 04/01/16 [phone]

## 2016-04-01 NOTE — Consult Note (Signed)
[facility] Cardiology  CARDIOLOGY CONSULT NOTE  Patient ID: Erika Rios MRN: [id number] DOB/AGE: September 09, 1960 55 y.o.  Admit date: 04/01/2016 Referring Physician [place] Primary Physician [facility] Primary Cardiologist  Reason for Consultation chest pain  HPI: 55 year old female referred for evaluation of chest pain. Patient has known history of coronary artery disease, status post anterior STEMI 02/24/2016, with successful DES proximal LAD (3.25 x 18 mm Xience Alpine stent). 2-D echocardiogram on 02/26/2016 revealed LV ejection fraction of 35-40%. Following discharge, the patient had done well. He did develop a rash to Brilinta which was switched to Effient.yesterday, the patient started to experience intermittent episodes of sternal, sharp, stabbing discomfort, which appeared to be exacerbated by deep breaths. The patient also reports worsening of chest discomfort when laying back, relieved by standing or sitting. ECG today reveals sinus rhythm with evolving, recent anterior MI which appears unchanged compared to discharge ECG from 02/28/2016.  Review of systems complete and found to be negative unless listed above     Past Medical History:  Diagnosis Date  . ST elevation myocardial infarction (STEMI) of anterior wall, initial episode of care ([place])   . Tobacco abuse     Past Surgical History:  Procedure Laterality Date  . CARDIAC CATHETERIZATION N/A 02/24/2016   Procedure: Left Heart Cath and Coronary Angiography;  Surgeon: Troy Sine, MD;  Location: [place] [facility];  Service: Cardiovascular;  Laterality: N/A;  . CARDIAC CATHETERIZATION N/A 02/24/2016   Procedure: Coronary Stent Intervention;  Surgeon: Troy Sine, MD;  Location: [place] [facility];  Service: Cardiovascular;  Laterality: N/A;     (Not in a hospital admission) Social History   Social History  . Marital status: Married    Spouse name: N/A  . Number of children: N/A  . Years of education: N/A   Occupational History  .  Not on file.   Social History Main Topics  . Smoking status: Former Smoker    Packs/day: 0.50    Years: 34.00    Types: Cigarettes  . Smokeless tobacco: Never Used  . Alcohol use No  . Drug use: No  . Sexual activity: Not on file   Other Topics Concern  . Not on file   Social History Narrative  . No narrative on file    Family History  Problem Relation Age of Onset  . Cancer Mother   . Heart disease Mother   . Heart attack Mother   . Diabetes Father       Review of systems complete and found to be negative unless listed above      PHYSICAL EXAM  General: Well developed, well nourished, in no acute distress HEENT:  Normocephalic and atramatic Neck:  No JVD.  Lungs: Clear bilaterally to auscultation and percussion. Heart: HRRR . Normal S1 and S2 without gallops or murmurs.  Abdomen: Bowel sounds are positive, abdomen soft and non-tender  Msk:  Back normal, normal gait. Normal strength and tone for age. Extremities: No clubbing, cyanosis or edema.   Neuro: Alert and oriented X 3. Psych:  Good affect, responds appropriately  Labs:   Lab Results  Component Value Date   WBC 13.2 (H) 04/01/2016   HGB 14.2 04/01/2016   HCT 39.8 04/01/2016   MCV 94.5 04/01/2016   PLT 307 04/01/2016    Recent Labs Lab 04/01/16 0846  NA 136  K 4.1  CL 104  CO2 24  BUN 10  CREATININE 0.93  CALCIUM 9.4  PROT 8.7*  BILITOT 0.6  ALKPHOS 97  ALT 21  AST 25  GLUCOSE 123*   Lab Results  Component Value Date   TROPONINI <0.03 04/01/2016    Lab Results  Component Value Date   CHOL 170 02/25/2016   Lab Results  Component Value Date   HDL 50 02/25/2016   Lab Results  Component Value Date   LDLCALC 102 (H) 02/25/2016   Lab Results  Component Value Date   TRIG 90 02/25/2016   Lab Results  Component Value Date   CHOLHDL 3.4 02/25/2016   No results found for: LDLDIRECT    Radiology: Dg Chest 1 View  Result Date: 04/01/2016 CLINICAL DATA:  Chest pain for 2 weeks  EXAM: CHEST 1 VIEW COMPARISON:  February 24, 2016 FINDINGS: Lungs are clear. Heart size and pulmonary vascularity are normal. No adenopathy. No pneumothorax. No bone lesions. IMPRESSION: No edema or consolidation. Electronically Signed   By: Lowella Grip III M.D.   On: 04/01/2016 09:57    EKG: normal sinus rhythm,recent anterior MI with Q waves leads V1 through V4  ASSESSMENT AND PLAN:   1. Chest pain,pleuritic in nature, negative troponin, nondiagnostic ECG, possible pericarditis 2. Recent anterior STEMI, status post DES proximal LAD,negative troponin, no change in ECG  Recommendations  1. Agree with overall current therapy 2. Continue heparin and nitroglycerin drip for now 3. Increase aspirin to 325 mg twice a day 4. Tramadol when necessary for pleuritic chest pain 5. Review 2-D echocardiogram 6. Consider cardiac catheterization 04/02/2016 patient pending patient's clinical course and repeat troponin  Signed: Tayna Smethurst MD,PhD, [facility] 04/01/2016, 1:26 PM

## 2016-04-01 NOTE — ED Notes (Signed)
Secretary called ICU to inquire about assigned bed for pt; was informed that Dr. Margaretmary Eddy was to consult with cardiology prior to them accepting the pt to be sure that pt was necessary for ICU.

## 2016-04-01 NOTE — Progress Notes (Addendum)
ANTICOAGULATION CONSULT NOTE - Initial Consult  Pharmacy Consult for Heparin Drip Indication: chest pain/ACS  Allergies  Allergen Reactions  . Latex Dermatitis  . Elastic Bandages & [Zinc] Rash  . Oxycodone Anxiety and Other (See Comments)    Patient Measurements: Height: 5\' 2"  (157.5 cm) Weight: 191 lb 2.2 oz (86.7 kg) IBW/kg (Calculated) : 50.1 Heparin Dosing Weight: 71.4 kg  Vital Signs: Temp: 98.9 F (37.2 C) (08/01 1730) Temp Source: Oral (08/01 1432) BP: 103/76 (08/01 1800) Pulse Rate: 89 (08/01 1800)  Labs:  Recent Labs  04/01/16 0846 04/01/16 1521 04/01/16 1819  HGB 14.2  --   --   HCT 39.8  --   --   PLT 307  --   --   APTT 26  --   --   LABPROT 13.1  --   --   INR 0.99  --   --   HEPARINUNFRC  --   --  0.14*  CREATININE 0.93  --   --   TROPONINI <0.03 <0.03  --     Estimated Creatinine Clearance: 70.6 mL/min (by C-G formula based on SCr of 0.93 mg/dL).   Medical History: Past Medical History:  Diagnosis Date  . ST elevation myocardial infarction (STEMI) of anterior wall, initial episode of care ([place])   . Tobacco abuse     Medications:  Scheduled:  . aspirin EC  325 mg Oral BID  . atorvastatin  80 mg Oral q1800  . carvedilol  1.56 mg Oral BID WC  . famotidine (PEPCID) IV  20 mg Intravenous Q12H  . heparin  2,500 Units Intravenous Once  . [START ON 04/02/2016] prasugrel  10 mg Oral Daily  . sodium chloride flush  3 mL Intravenous Q12H  . traMADol  50 mg Oral Q6H   Infusions:  . sodium chloride 75 mL/hr at 04/01/16 1509  . heparin 850 Units/hr (04/01/16 1141)    Assessment: Pharmacy consulted to dose and manage Heparin drip for this 55 y.o. female who is complaining of left substernal chest pain that started about 8:30 last night.   04/01/16 4000 units bolus x 1, heparin infusion at 850 units/hr  8/1 @ 1819 Anti-Xa 0.14   Goal of Therapy:  Heparin level 0.3-0.7 units/ml Monitor platelets by anticoagulation protocol: Yes   Plan:    Heparin level subtherapeutic at 0.14 @ 1819. Will order 2500 unit bolus and increase infusion to heparin 1100 units/hr. ICU nurse contacted with change in orders.  Anti-Xa level ordered for 6 hours.  Continue to monitor H&H and platelets   0802 0106 heparin level therapeutic. Continue current rate. Will recheck in 6 hours. -NAC  Nancy Fetter, PharmD Clinical Pharmacist 04/01/2016 7:07 PM

## 2016-04-01 NOTE — ED Triage Notes (Signed)
Reports stabbing chest pain onset last pm.  Took nitro with relief, started again this am.  Denies sob, skin w/d

## 2016-04-01 NOTE — Progress Notes (Signed)
Dr. Saralyn Pilar present at bedside speaking with patient and family. RN asked MD about order parameters for coreg and about need for nitro drip.  MD gave order to turn nitro drip off since it was only going at 23mcg and to hold coreg for SBP < 100.  MD gave order for a diet now and for patient to be NPO after midnight.

## 2016-04-02 ENCOUNTER — Inpatient Hospital Stay
Admit: 2016-04-02 | Discharge: 2016-04-02 | Disposition: A | Discharge status: Home / Self Care | Payer: BC Managed Care – PPO | Attending: Internal Medicine | Admitting: Internal Medicine

## 2016-04-02 LAB — COMPREHENSIVE METABOLIC PANEL
ALT: 17 U/L (ref 14–54)
ANION GAP: 8 (ref 5–15)
AST: 17 U/L (ref 15–41)
Albumin: 3.6 g/dL (ref 3.5–5.0)
Alkaline Phosphatase: 81 U/L (ref 38–126)
BUN: 8 mg/dL (ref 6–20)
CHLORIDE: 103 mmol/L (ref 101–111)
CO2: 22 mmol/L (ref 22–32)
Calcium: 8.7 mg/dL — ABNORMAL LOW (ref 8.9–10.3)
Creatinine, Ser: 0.6 mg/dL (ref 0.44–1.00)
GFR calc non Af Amer: >60 mL/min (ref >60)
Glucose, Bld: 134 mg/dL — ABNORMAL HIGH (ref 65–99)
Potassium: 4.1 mmol/L (ref 3.5–5.1)
SODIUM: 133 mmol/L — AB (ref 135–145)
Total Bilirubin: 1.1 mg/dL (ref 0.3–1.2)
Total Protein: 7.1 g/dL (ref 6.5–8.1)

## 2016-04-02 LAB — LIPID PANEL
CHOL/HDL RATIO: 2.9 ratio
CHOLESTEROL: 113 mg/dL (ref 0–200)
HDL: 39 mg/dL — ABNORMAL LOW (ref >40)
LDL Cholesterol: 57 mg/dL (ref 0–99)
Triglycerides: 87 mg/dL (ref <150)
VLDL: 17 mg/dL (ref 0–40)

## 2016-04-02 LAB — CBC
HCT: 35 % (ref 35.0–47.0)
Hemoglobin: 12.5 g/dL (ref 12.0–16.0)
MCH: 33.7 pg (ref 26.0–34.0)
MCHC: 35.7 g/dL (ref 32.0–36.0)
MCV: 94.6 fL (ref 80.0–100.0)
PLATELETS: 242 10*3/uL (ref 150–440)
RBC: 3.7 MIL/uL — AB (ref 3.80–5.20)
RDW: 13.5 % (ref 11.5–14.5)
WBC: 13 10*3/uL — ABNORMAL HIGH (ref 3.6–11.0)

## 2016-04-02 LAB — PROTIME-INR
INR: 1.06
PROTHROMBIN TIME: 13.8 s (ref 11.4–15.2)

## 2016-04-02 LAB — HEPARIN LEVEL (UNFRACTIONATED): HEPARIN UNFRACTIONATED: 0.51 [IU]/mL (ref 0.30–0.70)

## 2016-04-02 LAB — ECHOCARDIOGRAM COMPLETE
Height: 62 in
Weight: 3058.22 oz

## 2016-04-02 MED ORDER — PANTOPRAZOLE SODIUM 40 MG PO TBEC: 40.0000 mg | DELAYED_RELEASE_TABLET | Freq: Two times a day (BID) | ORAL | 0 refills | Status: DC

## 2016-04-02 MED ORDER — ASPIRIN 325 MG PO TBEC: 325.0000 mg | DELAYED_RELEASE_TABLET | Freq: Two times a day (BID) | ORAL | 0 refills | Status: DC

## 2016-04-02 MED ORDER — TRAMADOL HCL 50 MG PO TABS: 50.0000 mg | ORAL_TABLET | Freq: Four times a day (QID) | ORAL | 0 refills | Status: DC

## 2016-04-02 MED ORDER — PANTOPRAZOLE SODIUM 40 MG PO TBEC: 40.0000 mg | DELAYED_RELEASE_TABLET | Freq: Two times a day (BID) | ORAL | Status: DC

## 2016-04-02 NOTE — Progress Notes (Signed)
Dr. Darvin Neighbours made aware of "blood in urine", new orders received.

## 2016-04-02 NOTE — Progress Notes (Signed)
55 yo female transferred out from CCU to room 245.  No distress on ra.  Cardiac monitor placed on pt and verified.  Denies chest pain. Lungs clear bil.  Pt states she has blood in urine, urine was dumped before RN could see it.  Will inform MD.  Skin intact, verified with Crystal, RN.  SL rt fa and lt ac flushes well.  Pt nauseated, will monitor.  Denies need, oriented to room and surroundings, POC reviewed with pt. CB in reach, SR up x 2.

## 2016-04-02 NOTE — Progress Notes (Signed)
Dr. Saralyn Pilar saw patient and gave RN order to d/c heparin drip, transfer patient to 2A and diet order.

## 2016-04-02 NOTE — Progress Notes (Signed)
Patient transferred to room 245 by wheelchair with [place], [place].  Alert with no distress noted. Patient on tele monitor for transfer.

## 2016-04-02 NOTE — Progress Notes (Signed)
Pt discharged to home via wc.  Instructions and RX(Tramadol, Protonix, ASA) given to pt.  Questions answered.  No distress.

## 2016-04-02 NOTE — Progress Notes (Signed)
*  PRELIMINARY RESULTS* Echocardiogram 2D Echocardiogram has been performed.  Erika Rios 04/02/2016, 3:29 PM

## 2016-04-04 NOTE — Discharge Summary (Signed)
[facility] [place] NAME: Erika Rios    MR#:  [id number]  DATE OF BIRTH:  1960/10/09  DATE OF ADMISSION:  04/01/2016 ADMITTING PHYSICIAN: Nicholes Mango, MD  DATE OF DISCHARGE: 04/02/2016  3:44 PM  PRIMARY CARE PHYSICIAN: Tracie Harrier, MD   ADMISSION DIAGNOSIS:  Unstable angina ([place]) [I20.0] Acute chest pain [R07.9] Chest pain [R07.9]  DISCHARGE DIAGNOSIS:  Active Problems:   Unstable angina ([place])   SECONDARY DIAGNOSIS:   Past Medical History:  Diagnosis Date  . ST elevation myocardial infarction (STEMI) of anterior wall, initial episode of care ([place])   . Tobacco abuse      ADMITTING HISTORY  Erika Rios  is a 55 y.o. female with a known history of Recent STEMI status post cardiac stent placement at [facility] [place] approximately 8 weeks ago, on aspirin and effient is presenting to the ED with a chief complaint of the crushing chest pain which was started last night. Patient took one sublingual nitroglycerin and the chest pain was slightly better and she fell asleep. Again she woke up this morning with severe chest pain and came into the ED. Initial troponin is negative. Patient is started on nitroglycerin drip in the ED patient also heparin drip was started. Patient denies any shortness of breath. Reports that her midsternal chest pain is radiating to the left shoulder. Denies any dizziness or loss of consciousness. Denies any nausea vomiting setting of recent cold symptoms. No sick contacts. ED physician has discussed with on-call cardiologist Dr. Pauline Good COURSE:   * Pleuritic chest pain Patient was initially admitted due to concern for possible unstable angina. She was started on heparin and nitro drip which were later stopped by cardiology Dr. Tomasita Crumble shows. She was thought to have likely pericarditis and an echocardiogram was checked which showed nothing acute. Also possible confident of GERD. Patient is  being started on double dose aspirin along with PPIs and discharged home in a stable condition to follow-up with her primary care physician. She had no hypoxia or tachycardia. No risk factors for PE identified.  * CAD stable. Troponin normal.  CONSULTS OBTAINED:  Treatment Team:  Isaias Cowman, MD  DRUG ALLERGIES:   Allergies  Allergen Reactions  . Latex Dermatitis  . Elastic Bandages & [Zinc] Rash  . Oxycodone Anxiety and Other (See Comments)    DISCHARGE MEDICATIONS:   Discharge Medication List as of 04/02/2016  3:15 PM    START taking these medications   Details  pantoprazole (PROTONIX) 40 MG tablet Take 1 tablet (40 mg total) by mouth 2 (two) times daily before a meal., Starting Wed 04/02/2016, Print    traMADol (ULTRAM) 50 MG tablet Take 1 tablet (50 mg total) by mouth every 6 (six) hours., Starting Wed 04/02/2016, Print      CONTINUE these medications which have CHANGED   Details  aspirin EC 325 MG EC tablet Take 1 tablet (325 mg total) by mouth 2 (two) times daily., Starting Wed 04/02/2016, Normal      CONTINUE these medications which have NOT CHANGED   Details  acetaminophen (TYLENOL) 500 MG tablet Take 500 mg by mouth every 6 (six) hours as needed for mild pain., Until Discontinued, Historical Med    atorvastatin (LIPITOR) 80 MG tablet Take 1 tablet (80 mg total) by mouth daily at 6 PM., Starting Thu 02/28/2016, Normal    carvedilol (COREG) 3.125 MG tablet Take 0.5 tablets (1.56 mg total) by mouth 2 (two) times  daily with a meal., Starting Thu 02/28/2016, Normal    lisinopril (PRINIVIL,ZESTRIL) 2.5 MG tablet Take 1 tablet (2.5 mg total) by mouth daily., Starting Thu 02/28/2016, Normal    nitroGLYCERIN (NITROSTAT) 0.4 MG SL tablet Place 1 tablet (0.4 mg total) under the tongue every 5 (five) minutes x 3 doses as needed for chest pain., Starting Thu 02/28/2016, Normal    prasugrel (EFFIENT) 10 MG TABS tablet Take 1 tablet (10 mg total) by mouth daily., Starting Thu  03/06/2016, Normal    spironolactone (ALDACTONE) 25 MG tablet Take 0.5 tablets (12.5 mg total) by mouth daily., Starting Thu 02/28/2016, Normal        Today   VITAL SIGNS:  Blood pressure 108/67, pulse 75, temperature 98 F (36.7 C), resp. rate 18, height 5\' 2"  (1.575 m), weight 86.7 kg (191 lb 2.2 oz), SpO2 97 %.  I/O:  No intake or output data in the 24 hours ending 04/04/16 1038  PHYSICAL EXAMINATION:  Physical Exam  GENERAL:  55 y.o.-year-old patient lying in the bed with no acute distress.  LUNGS: Normal breath sounds bilaterally, no wheezing, rales,rhonchi or crepitation. No use of accessory muscles of respiration.  CARDIOVASCULAR: S1, S2 normal. No murmurs, rubs, or gallops.  ABDOMEN: Soft, non-tender, non-distended. Bowel sounds present. No organomegaly or mass.  NEUROLOGIC: Moves all 4 extremities. PSYCHIATRIC: The patient is alert and oriented x 3.  SKIN: No obvious rash, lesion, or ulcer.   DATA REVIEW:   CBC  Recent Labs Lab 04/02/16 0106  WBC 13.0*  HGB 12.5  HCT 35.0  PLT 242    Chemistries   Recent Labs Lab 04/02/16 0106  NA 133*  K 4.1  CL 103  CO2 22  GLUCOSE 134*  BUN 8  CREATININE 0.60  CALCIUM 8.7*  AST 17  ALT 17  ALKPHOS 81  BILITOT 1.1    Cardiac Enzymes  Recent Labs Lab 04/01/16 2124  TROPONINI <0.03    Microbiology Results  Results for orders placed or performed during the hospital encounter of 04/01/16  MRSA PCR Screening     Status: None   Collection Time: 04/01/16  6:31 PM  Result Value Ref Range Status   MRSA by PCR NEGATIVE NEGATIVE Final    Comment:        The GeneXpert MRSA Assay (FDA approved for NASAL specimens only), is one component of a comprehensive MRSA colonization surveillance program. It is not intended to diagnose MRSA infection nor to guide or monitor treatment for MRSA infections.     RADIOLOGY:  No results found.  Follow up with PCP in 1 week.  Management plans discussed with the  patient, family and they are in agreement.  CODE STATUS:  Code Status History    Date Active Date Inactive Code Status Order ID Comments User Context   04/01/2016  2:46 PM 04/02/2016  1:07 PM Full Code [id number]  Nicholes Mango, MD Inpatient   02/24/2016  3:41 PM 02/28/2016  8:20 PM Full Code [id number]  Eileen Stanford, PA-C Inpatient      TOTAL TIME TAKING CARE OF THIS PATIENT ON DAY OF DISCHARGE: more than 30 minutes.   Hillary Bow R M.D on 04/04/2016 at 10:38 AM  Between 7am to 6pm - Pager - [phone]  After 6pm go to [email or web address] - password [email or web address] [facility]  Office  [phone]  CC: Primary care physician; Tracie Harrier, MD  Note: This dictation was prepared with Dragon dictation along with smaller phrase technology. Any transcriptional  errors that result from this process are unintentional.

## 2016-04-07 DIAGNOSIS — I255 Ischemic cardiomyopathy: Secondary | ICD-10-CM | POA: Insufficient documentation

## 2016-05-09 NOTE — Telephone Encounter (Signed)
This encounter was created in error - please disregard.

## 2016-06-13 DIAGNOSIS — E538 Deficiency of other specified B group vitamins: Secondary | ICD-10-CM | POA: Insufficient documentation

## 2016-09-21 ENCOUNTER — Other Ambulatory Visit: Payer: Self-pay | Admitting: Physician Assistant

## 2016-09-22 NOTE — Telephone Encounter (Signed)
Rx(s) sent to pharmacy electronically.  

## 2016-09-28 ENCOUNTER — Other Ambulatory Visit: Payer: Self-pay | Admitting: Cardiovascular Disease

## 2016-10-16 ENCOUNTER — Other Ambulatory Visit: Payer: Self-pay | Admitting: Internal Medicine

## 2016-10-16 DIAGNOSIS — Z1231 Encounter for screening mammogram for malignant neoplasm of breast: Secondary | ICD-10-CM

## 2016-10-23 ENCOUNTER — Other Ambulatory Visit: Payer: Self-pay | Admitting: Physician Assistant

## 2016-10-23 NOTE — Telephone Encounter (Signed)
Rx(s) sent to pharmacy electronically.  

## 2016-11-12 ENCOUNTER — Ambulatory Visit
Admission: RE | Admit: 2016-11-12 | Discharge: 2016-11-12 | Disposition: A | Discharge status: Home / Self Care | Payer: BC Managed Care – PPO | Source: Ambulatory Visit | Attending: Internal Medicine | Admitting: Internal Medicine

## 2016-11-12 DIAGNOSIS — R921 Mammographic calcification found on diagnostic imaging of breast: Secondary | ICD-10-CM | POA: Diagnosis not present

## 2016-11-12 DIAGNOSIS — Z1231 Encounter for screening mammogram for malignant neoplasm of breast: Secondary | ICD-10-CM | POA: Diagnosis not present

## 2016-11-18 ENCOUNTER — Other Ambulatory Visit: Payer: Self-pay | Admitting: Internal Medicine

## 2016-11-18 DIAGNOSIS — R928 Other abnormal and inconclusive findings on diagnostic imaging of breast: Secondary | ICD-10-CM

## 2016-11-18 DIAGNOSIS — R921 Mammographic calcification found on diagnostic imaging of breast: Secondary | ICD-10-CM

## 2016-11-25 ENCOUNTER — Other Ambulatory Visit: Payer: Self-pay | Admitting: Internal Medicine

## 2016-11-25 ENCOUNTER — Ambulatory Visit
Admission: RE | Admit: 2016-11-25 | Discharge: 2016-11-25 | Disposition: A | Discharge status: Home / Self Care | Payer: BC Managed Care – PPO | Source: Ambulatory Visit | Attending: Internal Medicine | Admitting: Internal Medicine

## 2016-11-25 DIAGNOSIS — R928 Other abnormal and inconclusive findings on diagnostic imaging of breast: Secondary | ICD-10-CM

## 2016-11-25 DIAGNOSIS — R921 Mammographic calcification found on diagnostic imaging of breast: Secondary | ICD-10-CM

## 2016-11-27 ENCOUNTER — Other Ambulatory Visit: Payer: Self-pay | Admitting: Internal Medicine

## 2016-11-27 DIAGNOSIS — R921 Mammographic calcification found on diagnostic imaging of breast: Secondary | ICD-10-CM

## 2016-11-27 DIAGNOSIS — R928 Other abnormal and inconclusive findings on diagnostic imaging of breast: Secondary | ICD-10-CM

## 2016-12-10 ENCOUNTER — Ambulatory Visit
Admission: RE | Admit: 2016-12-10 | Discharge: 2016-12-10 | Disposition: A | Discharge status: Home / Self Care | Payer: BC Managed Care – PPO | Source: Ambulatory Visit | Attending: Internal Medicine | Admitting: Internal Medicine

## 2016-12-10 DIAGNOSIS — R921 Mammographic calcification found on diagnostic imaging of breast: Secondary | ICD-10-CM

## 2016-12-10 DIAGNOSIS — R928 Other abnormal and inconclusive findings on diagnostic imaging of breast: Secondary | ICD-10-CM

## 2016-12-10 DIAGNOSIS — D241 Benign neoplasm of right breast: Secondary | ICD-10-CM | POA: Insufficient documentation

## 2016-12-10 HISTORY — PX: BREAST BIOPSY: SHX20

## 2016-12-11 LAB — SURGICAL PATHOLOGY

## 2017-01-14 ENCOUNTER — Other Ambulatory Visit: Payer: Self-pay | Admitting: Cardiovascular Disease

## 2017-01-14 NOTE — Telephone Encounter (Signed)
Rx(s) sent to pharmacy electronically.  

## 2017-06-07 ENCOUNTER — Encounter: Payer: Self-pay | Admitting: Emergency Medicine

## 2017-06-07 ENCOUNTER — Emergency Department
Admission: EM | Admit: 2017-06-07 | Discharge: 2017-06-07 | Disposition: A | Discharge status: Home / Self Care | Payer: BC Managed Care – PPO | Attending: Emergency Medicine | Admitting: Emergency Medicine

## 2017-06-07 ENCOUNTER — Emergency Department: Payer: BC Managed Care – PPO

## 2017-06-07 DIAGNOSIS — Z885 Allergy status to narcotic agent status: Secondary | ICD-10-CM | POA: Insufficient documentation

## 2017-06-07 DIAGNOSIS — Z79899 Other long term (current) drug therapy: Secondary | ICD-10-CM | POA: Insufficient documentation

## 2017-06-07 DIAGNOSIS — Z7982 Long term (current) use of aspirin: Secondary | ICD-10-CM | POA: Diagnosis not present

## 2017-06-07 DIAGNOSIS — Z87891 Personal history of nicotine dependence: Secondary | ICD-10-CM | POA: Insufficient documentation

## 2017-06-07 DIAGNOSIS — R1032 Left lower quadrant pain: Secondary | ICD-10-CM

## 2017-06-07 DIAGNOSIS — K5732 Diverticulitis of large intestine without perforation or abscess without bleeding: Secondary | ICD-10-CM | POA: Diagnosis not present

## 2017-06-07 DIAGNOSIS — I252 Old myocardial infarction: Secondary | ICD-10-CM | POA: Insufficient documentation

## 2017-06-07 DIAGNOSIS — Z91040 Latex allergy status: Secondary | ICD-10-CM | POA: Diagnosis not present

## 2017-06-07 LAB — URINALYSIS, COMPLETE (UACMP) WITH MICROSCOPIC
BACTERIA UA: NONE SEEN
BILIRUBIN URINE: NEGATIVE
GLUCOSE, UA: NEGATIVE mg/dL
KETONES UR: NEGATIVE mg/dL
Leukocytes, UA: NEGATIVE
Nitrite: NEGATIVE
PH: 7 (ref 5.0–8.0)
PROTEIN: NEGATIVE mg/dL
Specific Gravity, Urine: 1.005 (ref 1.005–1.030)

## 2017-06-07 LAB — COMPREHENSIVE METABOLIC PANEL
ALT: 20 U/L (ref 14–54)
AST: 19 U/L (ref 15–41)
Albumin: 3.9 g/dL (ref 3.5–5.0)
Alkaline Phosphatase: 92 U/L (ref 38–126)
Anion gap: 8 (ref 5–15)
BUN: 8 mg/dL (ref 6–20)
CHLORIDE: 104 mmol/L (ref 101–111)
CO2: 23 mmol/L (ref 22–32)
CREATININE: 0.65 mg/dL (ref 0.44–1.00)
Calcium: 9.3 mg/dL (ref 8.9–10.3)
GFR calc non Af Amer: >60 mL/min (ref >60)
Glucose, Bld: 129 mg/dL — ABNORMAL HIGH (ref 65–99)
POTASSIUM: 4 mmol/L (ref 3.5–5.1)
SODIUM: 135 mmol/L (ref 135–145)
Total Bilirubin: 0.8 mg/dL (ref 0.3–1.2)
Total Protein: 8 g/dL (ref 6.5–8.1)

## 2017-06-07 LAB — CBC
HEMATOCRIT: 42.9 % (ref 35.0–47.0)
HEMOGLOBIN: 15 g/dL (ref 12.0–16.0)
MCH: 34.5 pg — AB (ref 26.0–34.0)
MCHC: 34.9 g/dL (ref 32.0–36.0)
MCV: 98.8 fL (ref 80.0–100.0)
Platelets: 310 10*3/uL (ref 150–440)
RBC: 4.34 MIL/uL (ref 3.80–5.20)
RDW: 13.3 % (ref 11.5–14.5)
WBC: 17.5 10*3/uL — ABNORMAL HIGH (ref 3.6–11.0)

## 2017-06-07 LAB — LIPASE, BLOOD: LIPASE: 21 U/L (ref 11–51)

## 2017-06-07 MED ORDER — METRONIDAZOLE 500 MG PO TABS
500.0000 mg | ORAL_TABLET | Freq: Once | ORAL | Status: AC
  Administered 2017-06-07: 500 mg via ORAL

## 2017-06-07 MED ORDER — CIPROFLOXACIN HCL 500 MG PO TABS
ORAL_TABLET | ORAL | Status: AC
  Filled 2017-06-07: qty 1

## 2017-06-07 MED ORDER — NAPROXEN 500 MG PO TABS: 500.0000 mg | ORAL_TABLET | Freq: Two times a day (BID) | ORAL | 0 refills | Status: DC

## 2017-06-07 MED ORDER — IOPAMIDOL (ISOVUE-300) INJECTION 61%
100.0000 mL | Freq: Once | INTRAVENOUS | Status: AC | PRN
  Administered 2017-06-07: 100 mL via INTRAVENOUS

## 2017-06-07 MED ORDER — SODIUM CHLORIDE 0.9 % IV BOLUS (SEPSIS)
1000.0000 mL | Freq: Once | INTRAVENOUS | Status: AC
  Administered 2017-06-07: 1000 mL via INTRAVENOUS

## 2017-06-07 MED ORDER — CIPROFLOXACIN HCL 500 MG PO TABS: 500.0000 mg | ORAL_TABLET | Freq: Two times a day (BID) | ORAL | 0 refills | Status: DC

## 2017-06-07 MED ORDER — IOPAMIDOL (ISOVUE-300) INJECTION 61%: 30.0000 mL | Freq: Once | INTRAVENOUS | Status: DC | PRN

## 2017-06-07 MED ORDER — HYDROMORPHONE HCL 1 MG/ML IJ SOLN
1.0000 mg | Freq: Once | INTRAMUSCULAR | Status: DC
  Filled 2017-06-07: qty 1

## 2017-06-07 MED ORDER — FENTANYL CITRATE (PF) 100 MCG/2ML IJ SOLN
50.0000 ug | Freq: Once | INTRAMUSCULAR | Status: AC
  Administered 2017-06-07: 50 ug via INTRAVENOUS
  Filled 2017-06-07: qty 2

## 2017-06-07 MED ORDER — ONDANSETRON HCL 4 MG/2ML IJ SOLN
4.0000 mg | Freq: Once | INTRAMUSCULAR | Status: AC
  Administered 2017-06-07: 4 mg via INTRAVENOUS
  Filled 2017-06-07: qty 2

## 2017-06-07 MED ORDER — CIPROFLOXACIN HCL 500 MG PO TABS
500.0000 mg | ORAL_TABLET | Freq: Once | ORAL | Status: AC
  Administered 2017-06-07: 500 mg via ORAL

## 2017-06-07 MED ORDER — ONDANSETRON 4 MG PO TBDP: 4.0000 mg | ORAL_TABLET | Freq: Three times a day (TID) | ORAL | 0 refills | Status: DC | PRN

## 2017-06-07 MED ORDER — METRONIDAZOLE 500 MG PO TABS: 500.0000 mg | ORAL_TABLET | Freq: Three times a day (TID) | ORAL | 0 refills | Status: DC

## 2017-06-07 MED ORDER — ONDANSETRON HCL 4 MG/2ML IJ SOLN
4.0000 mg | Freq: Once | INTRAMUSCULAR | Status: DC
  Filled 2017-06-07: qty 2

## 2017-06-07 MED ORDER — METRONIDAZOLE 500 MG PO TABS
ORAL_TABLET | ORAL | Status: AC
  Filled 2017-06-07: qty 1

## 2017-06-07 NOTE — ED Notes (Signed)
Pt given gingerale for fluid challenge. Pt states feels better.

## 2017-06-07 NOTE — ED Provider Notes (Signed)
Clinical Course as of Jun 07 1822  Sun Jun 07, 2017  1544 CT reviewed. D/w surgery Dr. Burt Knack.  If sx are controlled and pt is tolerating oral abx, given nl VS and no immunocompromise, pt would be suitable for DC home and outpt f/u in surgery clinic  [PS]    Clinical Course User Index [PS] Carrie Mew, MD     ----------------------------------------- 6:23 PM on 06/07/2017 -----------------------------------------  Patient tolerating oral intake, calm and comfortable. Vital signs unremarkable. Nontoxic, well-appearing, medically stable. Suitable for outpatient follow-up on oral antibiotics. Naproxen for pain, Zofran for nausea.  Final diagnoses:  Left lower quadrant pain  Sigmoid diverticulitis       Carrie Mew, MD 06/07/17 [phone]

## 2017-06-07 NOTE — Discharge Instructions (Signed)
Take Cipro and Flagyl antibiotics as prescribed to treat the infection of your colon. Follow-up with the surgery clinic for continued monitoring of your symptoms. Take naproxen for pain and Zofran as needed for nausea.

## 2017-06-07 NOTE — ED Provider Notes (Signed)
[facility] Emergency Department Provider Note ____________________________________________   I have reviewed the triage vital signs and the triage nursing note.  HISTORY  Chief Complaint Abdominal Pain   Historian Patient  HPI Erika Rios is a 56 y.o. female Presents for lower and left sided abd pain x 24 hours. Somewhat crampy and intermittent, at present moderate, at worst mild.  Nothing Makes it worse or better. No fever. Mild nausea without vomiting. Mild loose stools and no black or bloody stools.  She's never had pains like this before.    Past Medical History:  Diagnosis Date  . ST elevation myocardial infarction (STEMI) of anterior wall, initial episode of care ([place])   . Tobacco abuse     Patient Active Problem List   Diagnosis Date Noted  . Unstable angina ([place]) 04/01/2016  . Status post coronary artery stent placement   . Cardiomyopathy, ischemic   . STEMI (ST elevation myocardial infarction) ([place]) 02/24/2016  . Tobacco abuse   . ST elevation myocardial infarction (STEMI) of anterior wall, initial episode of care ([place])   . ST elevation myocardial infarction involving left anterior descending (LAD) coronary artery [facility])     Past Surgical History:  Procedure Laterality Date  . BREAST BIOPSY Right 12/10/2016   stereo path pending  . CARDIAC CATHETERIZATION N/A 02/24/2016   Procedure: Left Heart Cath and Coronary Angiography;  Surgeon: Troy Sine, MD;  Location: [facility];  Service: Cardiovascular;  Laterality: N/A;  . CARDIAC CATHETERIZATION N/A 02/24/2016   Procedure: Coronary Stent Intervention;  Surgeon: Troy Sine, MD;  Location: [facility];  Service: Cardiovascular;  Laterality: N/A;    Prior to Admission medications   Medication Sig Start Date End Date Taking? Authorizing Provider  acetaminophen (TYLENOL) 500 MG tablet Take 500 mg by mouth every 6 (six) hours as needed for mild pain.   Yes [provider]  aspirin EC 325 MG EC tablet Take 1 tablet (325 mg total) by mouth 2 (two) times daily. Patient taking differently: Take 325 mg by mouth daily.  04/02/16  Yes Sudini, Alveta Heimlich, MD  atorvastatin (LIPITOR) 80 MG tablet Take 1 tablet (80 mg total) by mouth daily at 6 PM. PLEASE CONTACT OFFICE FOR ADDITIONAL REFILLS FINAL WARNING 01/14/17  Yes Troy Sine, MD  carvedilol (COREG) 3.125 MG tablet Take 0.5 tablets (1.56 mg total) by mouth 2 (two) times daily with a meal. PLEASE CONTACT OFFICE FOR ADDITIONAL REFILLS 09/22/16  Yes Troy Sine, MD  Cyanocobalamin (B-12) 5000 MCG CAPS Take 5,000 Units by mouth daily.   Yes [provider]  lisinopril (PRINIVIL,ZESTRIL) 2.5 MG tablet Take 1 tablet (2.5 mg total) by mouth daily. PLEASE CONTACT OFFICE FOR ADDITIONAL REFILLS 09/22/16  Yes Troy Sine, MD  Magnesium 250 MG TABS Take 250 mg by mouth daily.   Yes [provider]  nitroGLYCERIN (NITROSTAT) 0.4 MG SL tablet Place 1 tablet (0.4 mg total) under the tongue every 5 (five) minutes x 3 doses as needed for chest pain. 02/28/16  Yes Bhagat, Bhavinkumar, PA  pantoprazole (PROTONIX) 40 MG tablet Take 1 tablet (40 mg total) by mouth 2 (two) times daily before a meal. Patient taking differently: Take 40 mg by mouth daily.  04/02/16  Yes Sudini, Alveta Heimlich, MD  prasugrel (EFFIENT) 10 MG TABS tablet TAKE 1 TABLET (10 MG TOTAL) BY MOUTH DAILY. 09/29/16  Yes Croitoru, Mihai, MD  spironolactone (ALDACTONE) 25 MG tablet Take 0.5 tablets (12.5 mg  total) by mouth daily. Patient taking differently: Take 12.5 mg by mouth at bedtime.  02/28/16  Yes Bhagat, Bhavinkumar, PA  traMADol (ULTRAM) 50 MG tablet Take 1 tablet (50 mg total) by mouth every 6 (six) hours. Patient not taking: Reported on 06/07/2017 04/02/16   Hillary Bow, MD    Allergies  Allergen Reactions  . Latex Dermatitis  . Elastic Bandages & [Zinc] Rash  . Oxycodone Anxiety and Other (See Comments)    Family History  Problem  Relation Age of Onset  . Cancer Mother   . Heart disease Mother   . Heart attack Mother   . Breast cancer Mother   . Diabetes Father     Social History Social History  Substance Use Topics  . Smoking status: Former Smoker    Packs/day: 0.50    Years: 34.00    Types: Cigarettes  . Smokeless tobacco: Never Used  . Alcohol use No    Review of Systems  Constitutional: Negative for fever. Eyes: Negative for visual changes. ENT: Negative for sore throat. Cardiovascular: Negative for chest pain. Respiratory: Negative for shortness of breath. Gastrointestinal: Negative for vomiting. Genitourinary: Negative for dysuria. Musculoskeletal: Negative for back pain. Skin: Negative for rash. Neurological: Negative for headache.  ____________________________________________   PHYSICAL EXAM:  VITAL SIGNS: ED Triage Vitals  Enc Vitals Group     BP 06/07/17 1159 124/74     Pulse Rate 06/07/17 1159 100     Resp 06/07/17 1159 20     Temp 06/07/17 1159 99 F (37.2 C)     Temp Source 06/07/17 1159 Oral     SpO2 06/07/17 1159 96 %     Weight 06/07/17 1200 200 lb (90.7 kg)     Height 06/07/17 1200 5\' 3"  (1.6 m)     Head Circumference --      Peak Flow --      Pain Score 06/07/17 1209 7     Pain Loc --      Pain Edu? --      Excl. in [place]? --      Constitutional: Alert and oriented. Well appearing and in no distress. HEENT   Head: Normocephalic and atraumatic.      Eyes: Conjunctivae are normal. Pupils equal and round.       Ears:         Nose: No congestion/rhinnorhea.   Mouth/Throat: Mucous membranes are moist.   Neck: No stridor. Cardiovascular/Chest: Normal rate, regular rhythm.  No murmurs, rubs, or gallops. Respiratory: Normal respiratory effort without tachypnea nor retractions. Breath sounds are clear and equal bilaterally. No wheezes/rales/rhonchi. Gastrointestinal: Soft. No distention, no guarding, no rebound. obese. Moderate suprapubic and left-sided abdomen  tenderness to palpation.  Genitourinary/rectal:Deferred Musculoskeletal: Nontender with normal range of motion in all extremities. No joint effusions.  No lower extremity tenderness.  No edema. Neurologic:  Normal speech and language. No gross or focal neurologic deficits are appreciated. Skin:  Skin is warm, dry and intact. No rash noted. Psychiatric: Mood and affect are normal. Speech and behavior are normal. Patient exhibits appropriate insight and judgment.   ____________________________________________  LABS (pertinent positives/negatives) I, Lisa Roca, MD the attending physician have reviewed the labs noted below.  Labs Reviewed  COMPREHENSIVE METABOLIC PANEL - Abnormal; Notable for the following:       Result Value   Glucose, Bld 129 (*)    All other components within normal limits  CBC - Abnormal; Notable for the following:    WBC 17.5 (*)  MCH 34.5 (*)    All other components within normal limits  LIPASE, BLOOD  URINALYSIS, COMPLETE (UACMP) WITH MICROSCOPIC    ____________________________________________    EKG I, Lisa Roca, MD, the attending physician have personally viewed and interpreted all ECGs.  none ____________________________________________  RADIOLOGY All Xrays were viewed by me.  Imaging interpreted by Radiologist, and I, Lisa Roca, MD the attending physician have reviewed the radiologist interpretation noted below.  CT abdomen and pelvis with contrast:  Pending __________________________________________  PROCEDURES  Procedure(s) performed: None  Critical Care performed: None  ____________________________________________  No current facility-administered medications on file prior to encounter.    Current Outpatient Prescriptions on File Prior to Encounter  Medication Sig Dispense Refill  . acetaminophen (TYLENOL) 500 MG tablet Take 500 mg by mouth every 6 (six) hours as needed for mild pain.    Mar[place] Kitchen aspirin EC 325 MG EC tablet Take 1  tablet (325 mg total) by mouth 2 (two) times daily. (Patient taking differently: Take 325 mg by mouth daily. ) 30 tablet 0  . atorvastatin (LIPITOR) 80 MG tablet Take 1 tablet (80 mg total) by mouth daily at 6 PM. PLEASE CONTACT OFFICE FOR ADDITIONAL REFILLS FINAL WARNING 7 tablet 0  . carvedilol (COREG) 3.125 MG tablet Take 0.5 tablets (1.56 mg total) by mouth 2 (two) times daily with a meal. PLEASE CONTACT OFFICE FOR ADDITIONAL REFILLS 30 tablet 0  . lisinopril (PRINIVIL,ZESTRIL) 2.5 MG tablet Take 1 tablet (2.5 mg total) by mouth daily. PLEASE CONTACT OFFICE FOR ADDITIONAL REFILLS 30 tablet 0  . nitroGLYCERIN (NITROSTAT) 0.4 MG SL tablet Place 1 tablet (0.4 mg total) under the tongue every 5 (five) minutes x 3 doses as needed for chest pain. 25 tablet 12  . pantoprazole (PROTONIX) 40 MG tablet Take 1 tablet (40 mg total) by mouth 2 (two) times daily before a meal. (Patient taking differently: Take 40 mg by mouth daily. ) 60 tablet 0  . prasugrel (EFFIENT) 10 MG TABS tablet TAKE 1 TABLET (10 MG TOTAL) BY MOUTH DAILY. 30 tablet 2  . spironolactone (ALDACTONE) 25 MG tablet Take 0.5 tablets (12.5 mg total) by mouth daily. (Patient taking differently: Take 12.5 mg by mouth at bedtime. ) 30 tablet 6  . traMADol (ULTRAM) 50 MG tablet Take 1 tablet (50 mg total) by mouth every 6 (six) hours. (Patient not taking: Reported on 06/07/2017) 20 tablet 0    ____________________________________________  ED COURSE / ASSESSMENT AND PLAN  Pertinent labs & imaging results that were available during my care of the patient were reviewed by me and considered in my medical decision making (see chart for details).   patient's reporting 24 hours of left lower quadrant pain, and she has borderline blood pressure around 619 systolic. She's not reporting any fevers. On laboratory evaluation her kidney function and O lites are within normal limits at reassuring. Patient has not been able to urinate yet. White blood count is  elevated at 17.5. We discussed risk and benefit and chose to proceed with imaging CT of the abdomen.  Patient care to be transferred to Dr. Joni Fears at [facility] per recheck and ua and ct abd.   DIFFERENTIAL DIAGNOSIS: Differential diagnosis includes, but is not limited to, ovarian cyst, ovarian torsion, acute appendicitis, diverticulitis, urinary tract infection/pyelonephritis, endometriosis, bowel obstruction, colitis, renal colic, gastroenteritis, hernia, pregnancy related pain including ectopic pregnancy, etc.   CONSULTATIONS:   None   Patient / Family / Caregiver informed of clinical course, medical decision-making process,  and agree with plan.   ___________________________________________   FINAL CLINICAL IMPRESSION(S) / ED DIAGNOSES   Final diagnoses:  Left lower quadrant pain              Note: This dictation was prepared with Dragon dictation. Any transcriptional errors that result from this process are unintentional    Lisa Roca, MD 06/07/17 1502

## 2017-06-07 NOTE — ED Notes (Signed)
Pt sleeping comfortably with fluids 800/1000ml in.  Pt had ginger ale for fluid challenge.

## 2017-06-07 NOTE — ED Triage Notes (Signed)
Pt to ED via POV with c/o LLQ abd pain radiating into back since last night. Pt states some diarrhea, denies n/v or fever.

## 2017-08-26 ENCOUNTER — Other Ambulatory Visit: Payer: Self-pay | Admitting: Physician Assistant

## 2017-10-15 DIAGNOSIS — E785 Hyperlipidemia, unspecified: Secondary | ICD-10-CM | POA: Insufficient documentation

## 2017-12-01 DIAGNOSIS — K5730 Diverticulosis of large intestine without perforation or abscess without bleeding: Secondary | ICD-10-CM | POA: Insufficient documentation

## 2018-02-17 DIAGNOSIS — R0602 Shortness of breath: Secondary | ICD-10-CM | POA: Insufficient documentation

## 2018-06-02 DIAGNOSIS — J449 Chronic obstructive pulmonary disease, unspecified: Secondary | ICD-10-CM | POA: Insufficient documentation

## 2018-06-24 ENCOUNTER — Ambulatory Visit: Payer: BC Managed Care – PPO | Attending: Internal Medicine

## 2018-06-24 DIAGNOSIS — G4762 Sleep related leg cramps: Secondary | ICD-10-CM | POA: Insufficient documentation

## 2018-10-02 DIAGNOSIS — Z95810 Presence of automatic (implantable) cardiac defibrillator: Secondary | ICD-10-CM

## 2018-10-02 HISTORY — DX: Presence of automatic (implantable) cardiac defibrillator: Z95.810

## 2018-10-08 ENCOUNTER — Encounter
Admission: RE | Admit: 2018-10-08 | Discharge: 2018-10-08 | Disposition: A | Discharge status: Home / Self Care | Payer: BC Managed Care – PPO | Source: Ambulatory Visit | Attending: Cardiology | Admitting: Cardiology

## 2018-10-08 ENCOUNTER — Other Ambulatory Visit: Payer: Self-pay

## 2018-10-08 ENCOUNTER — Ambulatory Visit
Admission: RE | Admit: 2018-10-08 | Discharge: 2018-10-08 | Disposition: A | Discharge status: Home / Self Care | Payer: BC Managed Care – PPO | Source: Ambulatory Visit | Attending: Cardiology | Admitting: Cardiology

## 2018-10-08 DIAGNOSIS — Z01810 Encounter for preprocedural cardiovascular examination: Secondary | ICD-10-CM | POA: Diagnosis not present

## 2018-10-08 HISTORY — DX: Gastro-esophageal reflux disease without esophagitis: K21.9

## 2018-10-08 LAB — BASIC METABOLIC PANEL
Anion gap: 10 (ref 5–15)
BUN: 9 mg/dL (ref 6–20)
CO2: 22 mmol/L (ref 22–32)
Calcium: 9.1 mg/dL (ref 8.9–10.3)
Chloride: 105 mmol/L (ref 98–111)
Creatinine, Ser: 0.78 mg/dL (ref 0.44–1.00)
GFR calc Af Amer: >60 mL/min (ref >60)
GFR calc non Af Amer: >60 mL/min (ref >60)
GLUCOSE: 106 mg/dL — AB (ref 70–99)
Potassium: 4.1 mmol/L (ref 3.5–5.1)
Sodium: 137 mmol/L (ref 135–145)

## 2018-10-08 LAB — CBC
HCT: 42.9 % (ref 36.0–46.0)
Hemoglobin: 14.6 g/dL (ref 12.0–15.0)
MCH: 33.6 pg (ref 26.0–34.0)
MCHC: 34 g/dL (ref 30.0–36.0)
MCV: 98.6 fL (ref 80.0–100.0)
Platelets: 261 10*3/uL (ref 150–400)
RBC: 4.35 MIL/uL (ref 3.87–5.11)
RDW: 12.4 % (ref 11.5–15.5)
WBC: 5.4 10*3/uL (ref 4.0–10.5)
nRBC: 0 % (ref 0.0–0.2)

## 2018-10-08 LAB — SURGICAL PCR SCREEN
MRSA, PCR: NEGATIVE
Staphylococcus aureus: NEGATIVE

## 2018-10-08 LAB — APTT: aPTT: 27 seconds (ref 24–36)

## 2018-10-08 LAB — PROTIME-INR
INR: 0.99
Prothrombin Time: 13 seconds (ref 11.4–15.2)

## 2018-10-08 NOTE — Pre-Procedure Instructions (Signed)
Reviewed cardiac history with Dr. Rosey Bath and her recent Echo results with her EF of 25%. Per Dr. Tomi Likens of ECHO to follow.   Echo complete12/17/2019 [place]  Component Name Value Ref Range  LV Ejection Fraction (%) 25   Aortic Valve Stenosis Grade none   Aortic Valve Regurgitation Grade none   Aortic Valve Max Velocity (m/s) 1.4 m/sec  Mitral Valve Stenosis Grade none   Mitral Valve Regurgitation Grade mild   Tricuspid Valve Regurgitation Grade mild   Tricuspid Valve Regurgitation Max Velocity (m/s) 2.2 m/sec  Right Ventricle Systolic Pressure (mmHg) 32.2 mmHg  LV End Diastolic Diameter (cm) 5.3 cm  LV End Systolic Diameter (cm) 4.6 cm  LV Septum Wall Thickness (cm) 1.1 cm  LV Posterior Wall Thickness (cm) 1 cm  Left Atrium Diameter (cm) 3.8 cm  Result Narrative             CARDIOLOGY DEPARTMENT          [name], [name]                  [id number]      [name]              Acct #: [id number]      [address] [place], [place] [address]    Date: 08/16/2018 02: 10 PM                                Adult  Female  Age: 58 yrs      ECHOCARDIOGRAM REPORT               Outpatient                                [name]    STUDY:CHEST WALL        TAPE:          MD1: Rios, Erika    ECHO:Yes  DOPPLER:Yes    FILE:          BP: 104/60 mmHg    COLOR:Yes  CONTRAST:No   MACHINE:Philips  RV BIOPSY:No     3D:No SOUND QLTY:Moderate      Height: 63 in   MEDIUM:None                       Weight: 214 lb                                BSA: 2.0 m2 _________________________________________________________________________________________        HISTORY: DOE          REASON: Assess, LV function       INDICATION: SOB (shortness of breath) on exertion [R06.02 (ICD-10-CM)] _________________________________________________________________________________________ ECHOCARDIOGRAPHIC MEASUREMENTS 2D DIMENSIONS AORTA         Values  Normal Range  MAIN PA     Values  Normal Range        Annulus: 1.8 cm    [2.1-2.5]     PA Main: nm*    [1.5-2.1]       Aorta Sin: 2.9 cm    [2.7-3.3]  RIGHT VENTRICLE      ST Junction: nm*     [2.3-2.9]     RV Base: nm*    [<  4.2]       Asc.Aorta: nm*     [2.3-3.1]     RV Mid: 3.0 cm  [<3.5] LEFT VENTRICLE                   RV Length: nm*    [<8.6]         LVIDd: 5.3 cm    [3.9-5.3]  INFERIOR VENA CAVA         LVIDs: 4.6 cm            Max. IVC: nm*    [<=2.1]           FS: 13.8 %    [>25]      Min. IVC: nm*          SWT: 1.1 cm    [0.5-0.9]  ------------------          PWT: 1.0 cm    [0.5-0.9]  nm* - not measured LEFT ATRIUM        LA Diam: 3.8 cm    [2.7-3.8]      LA A4C Area: nm*     [<20]       LA Volume: nm*     [22-52] _________________________________________________________________________________________ ECHOCARDIOGRAPHIC DESCRIPTIONS AORTIC ROOT          Size: Normal       Dissection: INDETERM FOR DISSECTION AORTIC VALVE        Leaflets: Tricuspid          Morphology: Normal        Mobility: Fully mobile LEFT VENTRICLE          Size: Normal            Anterior: HYPOCONTRACTILE      Contraction: REGIONALLY IMPAIRED      Lateral: HYPOCONTRACTILE       Closest EF: 25% (Estimated)         Septal: AKINETIC       LV Masses: No Masses            Apical: AKINETIC          LVH: None              Inferior: AKINETIC                           Posterior: HYPOCONTRACTILE      Dias.FxClass: N/A MITRAL VALVE        Leaflets: Normal            Mobility: Fully mobile       Morphology: THICKENED LEAFLET(S) LEFT ATRIUM          Size: Normal            LA Masses: No masses       IA Septum: Normal IAS MAIN PA          Size: Normal PULMONIC VALVE       Morphology: Normal            Mobility: Fully mobile RIGHT VENTRICLE       RV Masses: No Masses             Size: Normal       Free Wall: Normal           Contraction: Normal TRICUSPID VALVE        Leaflets: Normal            Mobility: Fully mobile       Morphology: Normal  RIGHT ATRIUM          Size: Normal            RA Other: None        RA Mass: No masses PERICARDIUM         Fluid: No effusion INFERIOR VENACAVA          Size: Normal Normal respiratory collapse _________________________________________________________________________________________  DOPPLER ECHO and OTHER SPECIAL PROCEDURES         Aortic: No AR           No AS             135.7 cm/sec peak vel   7.4 mmHg peak grad         Mitral: MILD MR          No MS             3.3 cm^2 by DOPPLER             MV Inflow E Vel = 71.3 cm/sec   MV Annulus E'Vel = 4.1 cm/sec             E/E'Ratio = 17.4       Tricuspid: MILD TR          No TS             220.2 cm/sec peak TR vel  24.4 mmHg peak RV pressure       Pulmonary: No PR           No PS             90.9 cm/sec peak vel    3.3 mmHg peak grad _________________________________________________________________________________________ INTERPRETATION SEVERE LV SYSTOLIC DYSFUNCTION  (See above) NORMAL RIGHT VENTRICULAR SYSTOLIC FUNCTION MILD VALVULAR REGURGITATION (See above) NO VALVULAR STENOSIS MILD MR, TR EF 25% _________________________________________________________________________________________ Electronically signed by   MD Erika Rios on 08/17/2018 05: 55 PM      Performed By: Erika Rios   Ordering Physician: [place]rika Rios _________________________________________________________________________________________  Other Result Information  Interface, Text Results In - 08/17/2018  5:55 PM EST                      CARDIOLOGY DEPARTMENT                   Rios, Erika                                    [phone]           [name]                           Acct #: [id number]           [address], [name], [name] [address]       Date: 08/16/2018 02: 7 PM                                                              Adult   Female   Age: 54 yrs  ECHOCARDIOGRAM REPORT                              Outpatient                                                              [facility]      STUDY:CHEST WALL               TAPE:                    MD1: Rios, Erika       ECHO:Yes    DOPPLER:Yes       FILE:                    BP: 104/60 mmHg      COLOR:Yes   CONTRAST:No     MACHINE:Philips  RV BIOPSY:No          3D:No  SOUND QLTY:Moderate            Height: 63 in     MEDIUM:None                                              Weight: 214 lb                                                              BSA: 2.0 m2 _________________________________________________________________________________________               HISTORY: DOE                REASON: Assess, LV function            INDICATION: SOB (shortness of breath) on exertion [R06.02 (ICD-10-CM)] _________________________________________________________________________________________ ECHOCARDIOGRAPHIC MEASUREMENTS 2D DIMENSIONS AORTA                   Values   Normal Range   MAIN PA         Values    Normal Range               Annulus: 1.8 cm       [2.1-2.5]         PA Main: nm*       [1.5-2.1]             Aorta Sin: 2.9 cm       [2.7-3.3]    RIGHT VENTRICLE           ST Junction: nm*          [2.3-2.9]         RV Base: nm*       [<4.2]             Asc.Aorta: nm*          [2.3-3.1]          RV Mid: 3.0 cm    [<3.5] LEFT VENTRICLE  RV Length: nm*       [<8.6]                 LVIDd: 5.3 cm       [3.9-5.3]    INFERIOR VENA CAVA                 LVIDs: 4.6 cm                        Max. IVC: nm*       [<=2.1]                    FS: 13.8 %       [>25]            Min. IVC: nm*                   SWT: 1.1 cm       [0.5-0.9]    ------------------                   PWT: 1.0 cm       [0.5-0.9]    nm* - not measured LEFT ATRIUM               LA Diam: 3.8 cm       [2.7-3.8]           LA A4C Area: nm*          [<20]             LA Volume: nm*          [22-52] _________________________________________________________________________________________ ECHOCARDIOGRAPHIC DESCRIPTIONS AORTIC ROOT                  Size: Normal            Dissection: INDETERM FOR DISSECTION AORTIC VALVE              Leaflets: Tricuspid                   Morphology: Normal              Mobility: Fully mobile LEFT VENTRICLE                  Size: Normal                        Anterior: HYPOCONTRACTILE           Contraction: REGIONALLY IMPAIRED            Lateral: HYPOCONTRACTILE            Closest EF: 25% (Estimated)                 Septal: AKINETIC             LV Masses: No Masses                       Apical: AKINETIC                   LVH: None                          Inferior: AKINETIC  Posterior: HYPOCONTRACTILE          Dias.FxClass: N/A MITRAL VALVE              Leaflets: Normal                        Mobility: Fully mobile            Morphology: THICKENED LEAFLET(S) LEFT  ATRIUM                  Size: Normal                       LA Masses: No masses             IA Septum: Normal IAS MAIN PA                  Size: Normal PULMONIC VALVE            Morphology: Normal                        Mobility: Fully mobile RIGHT VENTRICLE             RV Masses: No Masses                         Size: Normal             Free Wall: Normal                     Contraction: Normal TRICUSPID VALVE              Leaflets: Normal                        Mobility: Fully mobile            Morphology: Normal RIGHT ATRIUM                  Size: Normal                        RA Other: None               RA Mass: No masses PERICARDIUM                 Fluid: No effusion INFERIOR VENACAVA                  Size: Normal Normal respiratory collapse _________________________________________________________________________________________  DOPPLER ECHO and OTHER SPECIAL PROCEDURES                Aortic: No AR                      No AS                        135.7 cm/sec peak vel      7.4 mmHg peak grad                Mitral: MILD MR                    No MS                        3.3 cm^2 by DOPPLER  MV Inflow E Vel = 71.3 cm/sec     MV Annulus E'Vel = 4.1 cm/sec                        E/E'Ratio = 17.4             Tricuspid: MILD TR                    No TS                        220.2 cm/sec peak TR vel   24.4 mmHg peak RV pressure             Pulmonary: No PR                      No PS                        90.9 cm/sec peak vel       3.3 mmHg peak grad _________________________________________________________________________________________ INTERPRETATION SEVERE LV SYSTOLIC DYSFUNCTION (See above) NORMAL RIGHT VENTRICULAR SYSTOLIC FUNCTION MILD VALVULAR REGURGITATION (See above) NO VALVULAR STENOSIS MILD MR, TR EF 25% _________________________________________________________________________________________ Electronically signed by      MD Erika Rios on 08/17/2018 05: 51 PM          Performed By: Erika Rios    Ordering Physician: Margarito Courser, ANNA _________________________________________________________________________________________    it is okay to proceed as planned.

## 2018-10-08 NOTE — Patient Instructions (Signed)
Your procedure is scheduled on: Friday, February 21,2020  Report to [facility]   DO NOT REPORT TO THE FIRST FLOOR TO REGISTER  To find out your arrival time please call [phone] between 1PM - 3PM on Thursday, October 21, 2018  Remember: Instructions that are not followed completely may result in serious medical risk,  up to and including death, or upon the discretion of your surgeon and anesthesiologist your  surgery may need to be rescheduled.     _X__ 1. Do not eat food after midnight the night before your procedure.                 No gum chewing or hard candies.                  ABSOLUTELY NOTHING SOLID IN YOUR MOUTH AFTER MIDNIGHT                  You may drink clear liquids up to 2 hours before you are scheduled to arrive for your surgery-                   DO not drink clear liquids within 2 hours of the start of your surgery.                  Clear Liquids include:  water, apple juice without pulp, clear carbohydrate                 drink such as Clearfast of Gatorade, Black Coffee or Tea (Do not add                 anything to coffee or tea). YOU MAY ADD SUGAR BUT NO DAIRY PRODUCTS  __X__2.  On the morning of surgery brush your teeth with toothpaste and water,                     You may rinse your mouth with mouthwash if you wish.                             Do not swallow any toothpaste of mouthwash.     _X__ 3.  No Alcohol for 24 hours before or after surgery.   _X__ 4.  Do Not Smoke or use e-cigarettes For 24 Hours Prior to Your Surgery.                 Do not use any chewable tobacco products for at least 6 hours prior to                 surgery.  ____  5.  Bring all medications with you on the day of surgery if instructed.   ____  6.  Notify your doctor if there is any change in your medical condition      (cold, fever, infections).     Do not wear jewelry, make-up, hairpins, clips or nail polish. Do not  wear lotions, powders, or perfumes. You may NOT wear deodorant. Do not shave 48 hours prior to surgery. Men may shave face and neck. Do not bring valuables to the hospital.    [facility] is not responsible for any belongings or valuables.  Contacts, dentures or bridgework may not be worn into surgery. Leave your suitcase in the car. After surgery it may be brought to your room. For patients admitted to the hospital,  discharge time is determined by your treatment team.   Patients discharged the day of surgery will not be allowed to drive home.   Please read over the following fact sheets that you were given:   PREPARING FOR SURGERY  _X___ Take these medicines the morning of surgery with A SIP OF WATER:    1. CARVEDILOL  2. PROTONIX  3.   4.  5.  6.  ____ Fleet Enema (as directed)   _X___ Use CHG Soap as directed   __X__ Stop ASPIRIN TODAY  ____ Stop Anti-inflammatories AS OF TODAY          TYLENOL AT ANY TIME PRIOR TO SURGERY IS OKAY   ____ Stop supplements until after surgery.    ____ Bring C-Pap to the hospital.   IF YOU ARE TAKING THE LISINOPRIL, DO NOT TAKE ON THE MORNING OF SURGERY  BUT DO CONTINUE TAKING IT AS USUAL UP UNTIL THEN. DO NOT TAKE ALDACTONE OR POTASSIUM ON THE MORNING OF SURGERY    BUT DO CONTINUE TAKING IT AT NIGHT AS USUAL. CONTINUE TAKING LIPITOR AT NIGHT AS USUAL. YOU MAY CONTINUE TAKING VIT B12 AND MAGNESIUM UP UNTIL THE DAY OF SURGERY '    BUT DO NOT TAKE IT ON THE MORNING OF SURGERY

## 2018-10-18 ENCOUNTER — Other Ambulatory Visit: Payer: Self-pay

## 2018-10-18 ENCOUNTER — Emergency Department: Payer: BC Managed Care – PPO

## 2018-10-18 ENCOUNTER — Encounter: Payer: Self-pay | Admitting: Emergency Medicine

## 2018-10-18 ENCOUNTER — Emergency Department
Admission: EM | Admit: 2018-10-18 | Discharge: 2018-10-18 | Disposition: A | Discharge status: Home / Self Care | Payer: BC Managed Care – PPO | Attending: Emergency Medicine | Admitting: Emergency Medicine

## 2018-10-18 DIAGNOSIS — Z87891 Personal history of nicotine dependence: Secondary | ICD-10-CM | POA: Insufficient documentation

## 2018-10-18 DIAGNOSIS — Z79899 Other long term (current) drug therapy: Secondary | ICD-10-CM | POA: Insufficient documentation

## 2018-10-18 DIAGNOSIS — R109 Unspecified abdominal pain: Secondary | ICD-10-CM | POA: Insufficient documentation

## 2018-10-18 DIAGNOSIS — I252 Old myocardial infarction: Secondary | ICD-10-CM | POA: Insufficient documentation

## 2018-10-18 DIAGNOSIS — Z7982 Long term (current) use of aspirin: Secondary | ICD-10-CM | POA: Insufficient documentation

## 2018-10-18 DIAGNOSIS — I509 Heart failure, unspecified: Secondary | ICD-10-CM | POA: Diagnosis not present

## 2018-10-18 HISTORY — DX: Heart failure, unspecified: I50.9

## 2018-10-18 HISTORY — DX: Essential (primary) hypertension: I10

## 2018-10-18 LAB — URINALYSIS, COMPLETE (UACMP) WITH MICROSCOPIC
Bilirubin Urine: NEGATIVE
Glucose, UA: NEGATIVE mg/dL
KETONES UR: NEGATIVE mg/dL
Leukocytes,Ua: NEGATIVE
Nitrite: NEGATIVE
Protein, ur: NEGATIVE mg/dL
Specific Gravity, Urine: 1.027 (ref 1.005–1.030)
pH: 6 (ref 5.0–8.0)

## 2018-10-18 LAB — COMPREHENSIVE METABOLIC PANEL
ALT: 37 U/L (ref 0–44)
AST: 35 U/L (ref 15–41)
Albumin: 4.2 g/dL (ref 3.5–5.0)
Alkaline Phosphatase: 74 U/L (ref 38–126)
Anion gap: 7 (ref 5–15)
BUN: 7 mg/dL (ref 6–20)
CALCIUM: 9.1 mg/dL (ref 8.9–10.3)
CO2: 23 mmol/L (ref 22–32)
Chloride: 106 mmol/L (ref 98–111)
Creatinine, Ser: 0.81 mg/dL (ref 0.44–1.00)
GFR calc Af Amer: >60 mL/min (ref >60)
GFR calc non Af Amer: >60 mL/min (ref >60)
Glucose, Bld: 117 mg/dL — ABNORMAL HIGH (ref 70–99)
Potassium: 4.1 mmol/L (ref 3.5–5.1)
Sodium: 136 mmol/L (ref 135–145)
TOTAL PROTEIN: 7.2 g/dL (ref 6.5–8.1)
Total Bilirubin: 0.6 mg/dL (ref 0.3–1.2)

## 2018-10-18 LAB — CBC
HCT: 43.9 % (ref 36.0–46.0)
Hemoglobin: 14.9 g/dL (ref 12.0–15.0)
MCH: 33.2 pg (ref 26.0–34.0)
MCHC: 33.9 g/dL (ref 30.0–36.0)
MCV: 97.8 fL (ref 80.0–100.0)
Platelets: 388 10*3/uL (ref 150–400)
RBC: 4.49 MIL/uL (ref 3.87–5.11)
RDW: 12.6 % (ref 11.5–15.5)
WBC: 11.7 10*3/uL — ABNORMAL HIGH (ref 4.0–10.5)
nRBC: 0 % (ref 0.0–0.2)

## 2018-10-18 LAB — LIPASE, BLOOD: Lipase: 25 U/L (ref 11–51)

## 2018-10-18 LAB — TROPONIN I: Troponin I: <0.03 ng/mL (ref <0.03)

## 2018-10-18 MED ORDER — SODIUM CHLORIDE 0.9% FLUSH
3.0000 mL | Freq: Once | INTRAVENOUS | Status: AC
  Administered 2018-10-18: 3 mL via INTRAVENOUS

## 2018-10-18 MED ORDER — FENTANYL CITRATE (PF) 100 MCG/2ML IJ SOLN
100.0000 ug | Freq: Once | INTRAMUSCULAR | Status: AC
  Administered 2018-10-18: 100 ug via INTRAVENOUS
  Filled 2018-10-18: qty 2

## 2018-10-18 MED ORDER — DICYCLOMINE HCL 20 MG PO TABS: 20.0000 mg | ORAL_TABLET | Freq: Three times a day (TID) | ORAL | 0 refills | Status: DC | PRN

## 2018-10-18 MED ORDER — SODIUM CHLORIDE 0.9 % IV BOLUS
1000.0000 mL | Freq: Once | INTRAVENOUS | Status: AC
  Administered 2018-10-18: 1000 mL via INTRAVENOUS

## 2018-10-18 MED ORDER — IOPAMIDOL (ISOVUE-300) INJECTION 61%
30.0000 mL | Freq: Once | INTRAVENOUS | Status: AC | PRN
  Administered 2018-10-18: 30 mL via ORAL

## 2018-10-18 MED ORDER — ONDANSETRON 4 MG PO TBDP: 4.0000 mg | ORAL_TABLET | Freq: Three times a day (TID) | ORAL | 0 refills | Status: DC | PRN

## 2018-10-18 MED ORDER — ONDANSETRON HCL 4 MG/2ML IJ SOLN
4.0000 mg | Freq: Once | INTRAMUSCULAR | Status: AC
  Administered 2018-10-18: 4 mg via INTRAVENOUS
  Filled 2018-10-18: qty 2

## 2018-10-18 MED ORDER — IOPAMIDOL (ISOVUE-300) INJECTION 61%
100.0000 mL | Freq: Once | INTRAVENOUS | Status: AC | PRN
  Administered 2018-10-18: 100 mL via INTRAVENOUS

## 2018-10-18 NOTE — ED Notes (Signed)
Pt presents to ED via POV with c/o generalized abdominal pain. Pt states hx of diverticulitis, was placed on abx for it, states the abx gave her pain to abdomen, L flank that radiated up between her shoulder blades. Pt is A&O x4 at this time. NAD noted at this time.

## 2018-10-18 NOTE — ED Notes (Signed)
NAD noted at time of D/C. Pt ambulating towards lobby before this RN able to offer wheelchair, steady gate noted. Reviewed driving precautions with patient she asked if she was able to drive herself, this RN and family discouraged driving. Pt states understanding.

## 2018-10-18 NOTE — ED Triage Notes (Signed)
tx'd for diverticulitis placed on ABX last Thursday, since having increased abd pain with radiating pain between shoulder blades

## 2018-10-18 NOTE — ED Notes (Signed)
Pt notified this RN of being done with contrast, this RN notified CT. Pt denies any needs. Will continue to monitor for further patient needs.

## 2018-10-18 NOTE — ED Notes (Signed)
First Nurse Note: Patient states she is having chest pain "like when she had a heart attack".  Pulled to triage for EKG.

## 2018-10-18 NOTE — ED Notes (Signed)
Up to bathroom to void 

## 2018-10-18 NOTE — ED Notes (Signed)
Pt transported to CT at this time.

## 2018-10-18 NOTE — Discharge Instructions (Signed)
Please follow-up with your primary care doctor in the next several days for recheck/reevaluation.  Return to the emergency department for any worsening pain, development of fever, or any other symptom personally concerning to yourself.

## 2018-10-18 NOTE — ED Provider Notes (Signed)
[facility] Emergency Department Provider Note  Time seen: 3:04 PM  I have reviewed the triage vital signs and the nursing notes.   HISTORY  Chief Complaint Abdominal Pain    HPI Erika Rios is a 58 y.o. female with a past medical history of gastric reflux, MI, CHF, presents to the emergency department for abdominal pain.  According to the patient for the past for 5 days she has been experiencing vague generalized abdominal pain.  Went to her PCP was prescribed antibiotics for presumed diverticulitis as a patient states a history of the same in the past.  Patient states she has been somewhat nauseated, vomited last night, states she has had somewhat loose appearing stool.  Describes her pain as a 7 or 8/10 currently diffusely throughout the abdomen denies any focal area of pain in the abdomen but describes more of a generalized aching/cramping.  Denies any known fever.   Past Medical History:  Diagnosis Date  . GERD (gastroesophageal reflux disease)   . ST elevation myocardial infarction (STEMI) of anterior wall, initial episode of care ([place]) 2017  . Tobacco abuse    quit smoking 2017    Patient Active Problem List   Diagnosis Date Noted  . Unstable angina ([place]) 04/01/2016  . Status post coronary artery stent placement   . Cardiomyopathy, ischemic   . STEMI (ST elevation myocardial infarction) ([place]) 02/24/2016  . Tobacco abuse   . ST elevation myocardial infarction (STEMI) of anterior wall, initial episode of care ([place])   . ST elevation myocardial infarction involving left anterior descending (LAD) coronary artery [facility])     Past Surgical History:  Procedure Laterality Date  . BREAST BIOPSY Right 12/10/2016   chip remains in breast. benign  . CARDIAC CATHETERIZATION N/A 02/24/2016   Procedure: Left Heart Cath and Coronary Angiography;  Surgeon: Troy Sine, MD;  Location: [place];  Service: Cardiovascular;  Laterality: N/A;  . CARDIAC  CATHETERIZATION N/A 02/24/2016   Procedure: Coronary Stent Intervention;  Surgeon: Troy Sine, MD;  Location: [place] [facility];  Service: Cardiovascular;  Laterality: N/A;    Prior to Admission medications   Medication Sig Start Date End Date Taking? Authorizing Provider  acetaminophen (TYLENOL) 500 MG tablet Take 1,000 mg by mouth every 6 (six) hours as needed for mild pain or headache.     [provider]  aspirin EC 325 MG EC tablet Take 1 tablet (325 mg total) by mouth 2 (two) times daily. Patient taking differently: Take 325 mg by mouth daily. Once a day in the morning 04/02/16   Hillary Bow, MD  atorvastatin (LIPITOR) 20 MG tablet Take 20 mg by mouth every other day. Patient has hip pain from this and takes rarely    [provider]  carvedilol (COREG) 3.125 MG tablet Take 0.5 tablets (1.56 mg total) by mouth 2 (two) times daily with a meal. PLEASE CONTACT OFFICE FOR ADDITIONAL REFILLS Patient taking differently: Take 3.125 mg by mouth 2 (two) times daily.  09/22/16   Troy Sine, MD  Cyanocobalamin (B-12) 5000 MCG CAPS Take 5,000 mcg by mouth daily.     [provider]  lisinopril (PRINIVIL,ZESTRIL) 2.5 MG tablet Take 1 tablet (2.5 mg total) by mouth daily. PLEASE CONTACT OFFICE FOR ADDITIONAL REFILLS Patient taking differently: Take 2.5 mg by mouth daily. In the morning 09/22/16   Troy Sine, MD  Magnesium 250 MG TABS Take 250 mg by mouth daily.  [provider]  nitroGLYCERIN (NITROSTAT) 0.4 MG SL tablet PLACE 1 TABLET UNDER TONGUE EVERY 5 MINUTES IF NEEDED FOR CHEST PAIN UP TO 3 DOSES Patient taking differently: Place 0.4 mg under the tongue every 5 (five) minutes as needed for chest pain.  08/26/17   Bhagat, Crista Luria, PA  pantoprazole (PROTONIX) 40 MG tablet Take 1 tablet (40 mg total) by mouth 2 (two) times daily before a meal. Patient taking differently: Take 40 mg by mouth daily.  04/02/16   Hillary Bow, MD  Potassium 99 MG TABS  Take 198 mg by mouth every evening.    [provider]  spironolactone (ALDACTONE) 25 MG tablet Take 0.5 tablets (12.5 mg total) by mouth daily. Patient taking differently: Take 12.5 mg by mouth at bedtime.  02/28/16   Bhagat, Crista Luria, PA  triamcinolone cream (KENALOG) 0.1 % Apply 1 application topically daily. 06/02/18   [provider]    Allergies  Allergen Reactions  . Statins Other (See Comments)    Hip pain, constant  . Elastic Bandages & [Zinc] Rash    Paper tape should be okay  . Iron Nausea And Vomiting  . Latex Dermatitis  . Morphine And Related Rash    Red blotches all over body after taking this  . Nsaids     Told not to take d/t heart status  . Oxycodone Anxiety    Family History  Problem Relation Age of Onset  . Cancer Mother   . Heart disease Mother   . Heart attack Mother   . Breast cancer Mother   . Diabetes Father     Social History Social History   Tobacco Use  . Smoking status: Former Smoker    Packs/day: 0.50    Years: 34.00    Pack years: 17.00    Types: Cigarettes    Last attempt to quit: 03/19/2016    Years since quitting: 2.5  . Smokeless tobacco: Never Used  Substance Use Topics  . Alcohol use: No    Alcohol/week: 0.0 standard drinks  . Drug use: No    Review of Systems Constitutional: Negative for fever. Cardiovascular: Felt some discomfort up into the chest over the past several days denies any currently. Respiratory: Negative for shortness of breath. Gastrointestinal: Positive for generalized abdominal pain.  Positive for nausea, vomiting last night.  Occasional loose stool. Genitourinary: Negative for urinary compaints Musculoskeletal: Negative for musculoskeletal complaints Skin: Negative for skin complaints  Neurological: Negative for headache All other ROS negative  ____________________________________________   PHYSICAL EXAM:  VITAL SIGNS: ED Triage Vitals  Enc Vitals Group     BP 10/18/18 1056  116/87     Pulse Rate 10/18/18 1056 65     Resp 10/18/18 1056 18     Temp 10/18/18 1056 98.2 F (36.8 C)     Temp Source 10/18/18 1056 Oral     SpO2 10/18/18 1056 98 %     Weight 10/18/18 1058 208 lb (94.3 kg)     Height 10/18/18 1058 5\' 3"  (1.6 m)     Head Circumference --      Peak Flow --      Pain Score 10/18/18 1058 8     Pain Loc --      Pain Edu? --      Excl. in [place]? --     Constitutional: Alert and oriented. Well appearing and in no distress. Eyes: Normal exam ENT   Head: Normocephalic and atraumatic.   Mouth/Throat: Mucous membranes are  moist. Cardiovascular: Normal rate, regular rhythm.  Respiratory: Normal respiratory effort without tachypnea nor retractions. Breath sounds are clear  Gastrointestinal: Soft, mild diffuse generalized tenderness to palpation.  No rebound or guarding or distention.  No focal area of tenderness identified. Musculoskeletal: Nontender with normal range of motion in all extremities.  Neurologic:  Normal speech and language. No gross focal neurologic deficits Skin:  Skin is warm, dry and intact.  Psychiatric: Mood and affect are normal. Speech and behavior are normal.   ____________________________________________    EKG  EKG viewed and interpreted by myself shows a normal sinus rhythm at 70 bpm with a narrow QRS, normal axis, normal intervals, nonspecific ST changes.  No ST elevation.  ____________________________________________    RADIOLOGY  CT scan shows cholelithiasis otherwise negative.  ____________________________________________   INITIAL IMPRESSION / ASSESSMENT AND PLAN / ED COURSE  Pertinent labs & imaging results that were available during my care of the patient were reviewed by me and considered in my medical decision making (see chart for details).  Patient presents emergency department for abdominal pain over the past for 5 days along with intermittent nausea vomiting and diarrhea.  No fever.  Mild diffuse  pain on exam, states 8/10 in severity currently.  Patient's labs show a mild leukocytosis otherwise largely within normal limits.  Urinalysis pending.  Troponin is negative.  EKG is reassuring.  Given the patient's diffuse abdominal discomfort with mild leukocytosis we will proceed with CT imaging of the abdomen/pelvis to further evaluate.  Patient agreeable to plan of care.  We will treat the patient's pain, nausea and IV hydrate while awaiting results.  CT shows cholelithiasis otherwise negative.  LFTs are normal.  No focal right upper quadrant tenderness.  At this time it is not entirely clear the cause of the patient's abdominal discomfort.  Patient states she feels much better at this time.  We will discharge with pain and nausea medication have the patient follow-up with her PCP.  I discussed return precautions for any worsening pain or development of fever.  Patient agreeable to plan of care.  ____________________________________________   FINAL CLINICAL IMPRESSION(S) / ED DIAGNOSES  Abdominal pain   Harvest Dark, MD 10/18/18 1818

## 2018-10-22 ENCOUNTER — Observation Stay: Payer: BC Managed Care – PPO | Admitting: Anesthesiology

## 2018-10-22 ENCOUNTER — Encounter: Payer: Self-pay | Admitting: *Deleted

## 2018-10-22 ENCOUNTER — Observation Stay
Admission: RE | Admit: 2018-10-22 | Discharge: 2018-10-23 | Disposition: A | Discharge status: Home / Self Care | Payer: BC Managed Care – PPO | Attending: Internal Medicine | Admitting: Internal Medicine

## 2018-10-22 ENCOUNTER — Encounter
Admission: RE | Disposition: A | Discharge status: Home / Self Care | Payer: Self-pay | Source: Home / Self Care | Attending: Internal Medicine

## 2018-10-22 ENCOUNTER — Other Ambulatory Visit: Payer: Self-pay

## 2018-10-22 ENCOUNTER — Observation Stay: Payer: BC Managed Care – PPO

## 2018-10-22 DIAGNOSIS — I252 Old myocardial infarction: Secondary | ICD-10-CM | POA: Diagnosis not present

## 2018-10-22 DIAGNOSIS — Z87891 Personal history of nicotine dependence: Secondary | ICD-10-CM | POA: Insufficient documentation

## 2018-10-22 DIAGNOSIS — J449 Chronic obstructive pulmonary disease, unspecified: Secondary | ICD-10-CM | POA: Diagnosis not present

## 2018-10-22 DIAGNOSIS — Z885 Allergy status to narcotic agent status: Secondary | ICD-10-CM | POA: Insufficient documentation

## 2018-10-22 DIAGNOSIS — Z7982 Long term (current) use of aspirin: Secondary | ICD-10-CM | POA: Diagnosis not present

## 2018-10-22 DIAGNOSIS — I2510 Atherosclerotic heart disease of native coronary artery without angina pectoris: Secondary | ICD-10-CM | POA: Insufficient documentation

## 2018-10-22 DIAGNOSIS — R0602 Shortness of breath: Secondary | ICD-10-CM | POA: Diagnosis not present

## 2018-10-22 DIAGNOSIS — Z955 Presence of coronary angioplasty implant and graft: Secondary | ICD-10-CM | POA: Insufficient documentation

## 2018-10-22 DIAGNOSIS — Z950 Presence of cardiac pacemaker: Secondary | ICD-10-CM

## 2018-10-22 DIAGNOSIS — Z006 Encounter for examination for normal comparison and control in clinical research program: Secondary | ICD-10-CM | POA: Diagnosis not present

## 2018-10-22 DIAGNOSIS — I255 Ischemic cardiomyopathy: Secondary | ICD-10-CM | POA: Insufficient documentation

## 2018-10-22 DIAGNOSIS — E785 Hyperlipidemia, unspecified: Secondary | ICD-10-CM | POA: Diagnosis not present

## 2018-10-22 DIAGNOSIS — I5022 Chronic systolic (congestive) heart failure: Principal | ICD-10-CM | POA: Diagnosis present

## 2018-10-22 DIAGNOSIS — Z91040 Latex allergy status: Secondary | ICD-10-CM | POA: Diagnosis not present

## 2018-10-22 DIAGNOSIS — Z79899 Other long term (current) drug therapy: Secondary | ICD-10-CM | POA: Insufficient documentation

## 2018-10-22 HISTORY — PX: IMPLANTABLE CARDIOVERTER DEFIBRILLATOR (ICD) GENERATOR CHANGE: SHX5469

## 2018-10-22 SURGERY — ICD GENERATOR CHANGE
Anesthesia: General | Site: Chest | Laterality: Left

## 2018-10-22 MED ORDER — GENTAMICIN SULFATE 40 MG/ML IJ SOLN
INTRAMUSCULAR | Status: AC
  Filled 2018-10-22: qty 2

## 2018-10-22 MED ORDER — CEFAZOLIN SODIUM-DEXTROSE 1-4 GM/50ML-% IV SOLN
INTRAVENOUS | Status: AC
  Filled 2018-10-22: qty 50

## 2018-10-22 MED ORDER — PROPOFOL 500 MG/50ML IV EMUL
INTRAVENOUS | Status: DC | PRN
  Administered 2018-10-22: 50 ug/kg/min via INTRAVENOUS

## 2018-10-22 MED ORDER — FENTANYL CITRATE (PF) 100 MCG/2ML IJ SOLN
INTRAMUSCULAR | Status: AC
  Filled 2018-10-22: qty 2

## 2018-10-22 MED ORDER — FENTANYL CITRATE (PF) 100 MCG/2ML IJ SOLN
25.0000 ug | INTRAMUSCULAR | Status: DC | PRN
  Administered 2018-10-22 (×3): 50 ug via INTRAVENOUS

## 2018-10-22 MED ORDER — PROPOFOL 10 MG/ML IV BOLUS
INTRAVENOUS | Status: DC | PRN
  Administered 2018-10-22: 10 mg via INTRAVENOUS
  Administered 2018-10-22: 20 mg via INTRAVENOUS

## 2018-10-22 MED ORDER — LACTATED RINGERS IV SOLN
INTRAVENOUS | Status: DC
  Administered 2018-10-22 (×2): via INTRAVENOUS

## 2018-10-22 MED ORDER — SODIUM CHLORIDE 0.9 % IV SOLN
Freq: Once | INTRAVENOUS | Status: DC
  Filled 2018-10-22: qty 2

## 2018-10-22 MED ORDER — FENTANYL CITRATE (PF) 100 MCG/2ML IJ SOLN
25.0000 ug | INTRAMUSCULAR | Status: AC | PRN
  Administered 2018-10-22 (×2): 25 ug via INTRAVENOUS

## 2018-10-22 MED ORDER — PROPOFOL 10 MG/ML IV BOLUS
INTRAVENOUS | Status: AC
  Filled 2018-10-22: qty 20

## 2018-10-22 MED ORDER — ACETAMINOPHEN 325 MG PO TABS
325.0000 mg | ORAL_TABLET | ORAL | Status: DC | PRN
  Administered 2018-10-22 – 2018-10-23 (×4): 650 mg via ORAL
  Filled 2018-10-22 (×4): qty 2

## 2018-10-22 MED ORDER — ONDANSETRON HCL 4 MG/2ML IJ SOLN: 4.0000 mg | Freq: Once | INTRAMUSCULAR | Status: DC | PRN

## 2018-10-22 MED ORDER — HEPARIN SODIUM (PORCINE) 5000 UNIT/ML IJ SOLN
INTRAMUSCULAR | Status: AC
  Filled 2018-10-22: qty 1

## 2018-10-22 MED ORDER — MIDAZOLAM HCL 2 MG/2ML IJ SOLN
INTRAMUSCULAR | Status: DC | PRN
  Administered 2018-10-22: 2 mg via INTRAVENOUS

## 2018-10-22 MED ORDER — LACTATED RINGERS IV SOLN
INTRAVENOUS | Status: DC
  Administered 2018-10-22: 11:00:00 via INTRAVENOUS

## 2018-10-22 MED ORDER — PHENYLEPHRINE HCL 10 MG/ML IJ SOLN
INTRAMUSCULAR | Status: DC | PRN
  Administered 2018-10-22: 100 ug via INTRAVENOUS
  Administered 2018-10-22 (×2): 200 ug via INTRAVENOUS
  Administered 2018-10-22: 100 ug via INTRAVENOUS
  Administered 2018-10-22: 200 ug via INTRAVENOUS
  Administered 2018-10-22: 100 ug via INTRAVENOUS
  Administered 2018-10-22 (×2): 200 ug via INTRAVENOUS
  Administered 2018-10-22: 100 ug via INTRAVENOUS
  Administered 2018-10-22: 200 ug via INTRAVENOUS

## 2018-10-22 MED ORDER — VANCOMYCIN HCL IN DEXTROSE 1-5 GM/200ML-% IV SOLN
1000.0000 mg | Freq: Two times a day (BID) | INTRAVENOUS | Status: AC
  Administered 2018-10-22: 1000 mg via INTRAVENOUS
  Filled 2018-10-22: qty 200

## 2018-10-22 MED ORDER — FENTANYL CITRATE (PF) 100 MCG/2ML IJ SOLN
INTRAMUSCULAR | Status: DC | PRN
  Administered 2018-10-22 (×2): 25 ug via INTRAVENOUS

## 2018-10-22 MED ORDER — FENTANYL CITRATE (PF) 100 MCG/2ML IJ SOLN
INTRAMUSCULAR | Status: AC
  Administered 2018-10-22: 50 ug via INTRAVENOUS
  Filled 2018-10-22: qty 2

## 2018-10-22 MED ORDER — PROPOFOL 500 MG/50ML IV EMUL
INTRAVENOUS | Status: AC
  Filled 2018-10-22: qty 50

## 2018-10-22 MED ORDER — CEFAZOLIN SODIUM-DEXTROSE 2-4 GM/100ML-% IV SOLN
INTRAVENOUS | Status: AC
  Filled 2018-10-22: qty 100

## 2018-10-22 MED ORDER — CEFAZOLIN SODIUM-DEXTROSE 1-4 GM/50ML-% IV SOLN: 1.0000 g | INTRAVENOUS | Status: DC

## 2018-10-22 MED ORDER — MORPHINE SULFATE (PF) 2 MG/ML IV SOLN
2.0000 mg | INTRAVENOUS | Status: DC | PRN
  Administered 2018-10-22 – 2018-10-23 (×4): 2 mg via INTRAVENOUS
  Filled 2018-10-22 (×4): qty 1

## 2018-10-22 MED ORDER — VASOPRESSIN 20 UNIT/ML IV SOLN
INTRAVENOUS | Status: DC | PRN
  Administered 2018-10-22: 1 [IU] via INTRAVENOUS

## 2018-10-22 MED ORDER — EPHEDRINE SULFATE 50 MG/ML IJ SOLN
INTRAMUSCULAR | Status: DC | PRN
  Administered 2018-10-22: 10 mg via INTRAVENOUS

## 2018-10-22 MED ORDER — CEFAZOLIN SODIUM-DEXTROSE 2-4 GM/100ML-% IV SOLN
2.0000 g | Freq: Once | INTRAVENOUS | Status: AC
  Administered 2018-10-22: 2 g via INTRAVENOUS

## 2018-10-22 MED ORDER — MIDAZOLAM HCL 2 MG/2ML IJ SOLN
INTRAMUSCULAR | Status: AC
  Filled 2018-10-22: qty 2

## 2018-10-22 MED ORDER — MORPHINE SULFATE (PF) 2 MG/ML IV SOLN
2.0000 mg | Freq: Once | INTRAVENOUS | Status: AC
  Administered 2018-10-22: 2 mg via INTRAVENOUS
  Filled 2018-10-22: qty 1

## 2018-10-22 MED ORDER — ONDANSETRON HCL 4 MG/2ML IJ SOLN: 4.0000 mg | Freq: Four times a day (QID) | INTRAMUSCULAR | Status: DC | PRN

## 2018-10-22 MED ORDER — LIDOCAINE 1 % OPTIME INJ - NO CHARGE
INTRAMUSCULAR | Status: DC | PRN
  Administered 2018-10-22: 10 mL

## 2018-10-22 MED ORDER — SEVOFLURANE IN SOLN
RESPIRATORY_TRACT | Status: AC
  Filled 2018-10-22: qty 250

## 2018-10-22 MED ORDER — CEFAZOLIN (ANCEF) 1 G IV SOLR: 1.0000 g | INTRAVENOUS | Status: DC

## 2018-10-22 SURGICAL SUPPLY — 46 items
BAG DECANTER FOR FLEXI CONT (MISCELLANEOUS) ×2 IMPLANT
CABLE SURG 12 DISP A/V CHANNEL (MISCELLANEOUS) ×2 IMPLANT
CANISTER SUCT 1200ML W/VALVE (MISCELLANEOUS) ×2 IMPLANT
CHLORAPREP W/TINT 26ML (MISCELLANEOUS) ×2 IMPLANT
COVER LIGHT HANDLE STERIS (MISCELLANEOUS) ×4 IMPLANT
COVER MAYO STAND STRL (DRAPES) ×2 IMPLANT
COVER PROBE FLX POLY STRL (MISCELLANEOUS) ×2 IMPLANT
COVER WAND RF STERILE (DRAPES) ×2 IMPLANT
DRAPE C-ARM XRAY 36X54 (DRAPES) ×2 IMPLANT
DRSG TEGADERM 4X4.75 (GAUZE/BANDAGES/DRESSINGS) ×2 IMPLANT
DRSG TELFA 4X3 1S NADH ST (GAUZE/BANDAGES/DRESSINGS) ×2 IMPLANT
ELECT REM PT RETURN 9FT ADLT (ELECTROSURGICAL) ×2
ELECTRODE REM PT RTRN 9FT ADLT (ELECTROSURGICAL) ×1 IMPLANT
GLOVE BIO SURGEON STRL SZ7.5 (GLOVE) ×10 IMPLANT
GOWN STRL REUS W/ TWL LRG LVL3 (GOWN DISPOSABLE) ×2 IMPLANT
GOWN STRL REUS W/ TWL XL LVL3 (GOWN DISPOSABLE) ×1 IMPLANT
GOWN STRL REUS W/TWL LRG LVL3 (GOWN DISPOSABLE) ×2
GOWN STRL REUS W/TWL XL LVL3 (GOWN DISPOSABLE) ×1
HANDLE YANKAUER SUCT BULB TIP (MISCELLANEOUS) ×2 IMPLANT
ICD EVERA DR XT MRI DDMB1D4 (ICD Generator) ×2 IMPLANT
IMMOBILIZER SHDR LG LX 900803 (SOFTGOODS) ×2 IMPLANT
INTRO PACEMAKR LEAD 9FR 13CM (INTRODUCER) ×2
INTRO PACEMKR SHEATH II 7FR (MISCELLANEOUS) ×2
INTRODUCER PACEMKR LD 9FR 13CM (INTRODUCER) ×1 IMPLANT
INTRODUCER PACEMKR SHTH II 7FR (MISCELLANEOUS) ×1 IMPLANT
IV NS 1000ML (IV SOLUTION) ×1
IV NS 1000ML BAXH (IV SOLUTION) ×1 IMPLANT
KIT TURNOVER KIT A (KITS) ×2 IMPLANT
LABEL OR SOLS (LABEL) ×2 IMPLANT
LEAD CAPSURE NOVUS 45CM (Lead) ×2 IMPLANT
LEAD SPRINT QUAT SEC 6935M-55 (Lead) ×2 IMPLANT
NEEDLE FILTER BLUNT 18X 1/2SAF (NEEDLE) ×1
NEEDLE FILTER BLUNT 18X1 1/2 (NEEDLE) ×1 IMPLANT
NEEDLE HYPO 25X1 1.5 SAFETY (NEEDLE) ×2 IMPLANT
NEEDLE SPNL 22GX3.5 QUINCKE BK (NEEDLE) ×2 IMPLANT
NS IRRIG 500ML POUR BTL (IV SOLUTION) ×2 IMPLANT
PACK PACE INSERTION (MISCELLANEOUS) ×2 IMPLANT
PAD STATPAD (MISCELLANEOUS) ×2 IMPLANT
SUT DVC V-LOC 4-0 90 CLR P-12 (SUTURE) ×2
SUT DVC VLOC 3-0 CL 6 P-12 (SUTURE) ×2 IMPLANT
SUT SILK 0 CT 1 30 (SUTURE) ×2 IMPLANT
SUT VIC AB 3-0 PS2 18 (SUTURE) ×2 IMPLANT
SUTURE DVC V-LC4-0 90 CLR P-12 (SUTURE) ×1 IMPLANT
SYR 10ML LL (SYRINGE) ×2 IMPLANT
SYR CONTROL 10ML (SYRINGE) ×2 IMPLANT
TOWEL OR 17X26 4PK STRL BLUE (TOWEL DISPOSABLE) ×2 IMPLANT

## 2018-10-22 NOTE — Anesthesia Preprocedure Evaluation (Signed)
Anesthesia Evaluation  Patient identified by MRN, date of birth, ID band Patient awake    Reviewed: Allergy & Precautions, NPO status , Patient's Chart, lab work & pertinent test results  History of Anesthesia Complications Negative for: history of anesthetic complications  Airway Mallampati: III  TM Distance: >3 FB Neck ROM: Full    Dental no notable dental hx.    Pulmonary neg sleep apnea, neg COPD, former smoker,    breath sounds clear to auscultation- rhonchi (-) wheezing      Cardiovascular hypertension, + CAD, + Past MI, + Cardiac Stents and +CHF (EF 25%)   Rhythm:Regular Rate:Normal - Systolic murmurs and - Diastolic murmurs    Neuro/Psych neg Seizures negative neurological ROS  negative psych ROS   GI/Hepatic Neg liver ROS, GERD  ,  Endo/Other  negative endocrine ROSneg diabetes  Renal/GU negative Renal ROS     Musculoskeletal negative musculoskeletal ROS (+)   Abdominal (+) + obese,   Peds  Hematology negative hematology ROS (+)   Anesthesia Other Findings Past Medical History: No date: CHF (congestive heart failure) (HCC) No date: GERD (gastroesophageal reflux disease) No date: Hypertension 2017: ST elevation myocardial infarction (STEMI) of anterior wall,  initial episode of care ([place]) No date: Tobacco abuse     Comment:  quit smoking 2017   Reproductive/Obstetrics                             Anesthesia Physical Anesthesia Plan  ASA: III  Anesthesia Plan: General   Post-op Pain Management:    Induction: Intravenous  PONV Risk Score and Plan: 2 and Propofol infusion  Airway Management Planned: Natural Airway  Additional Equipment:   Intra-op Plan:   Post-operative Plan:   Informed Consent: I have reviewed the patients History and Physical, chart, labs and discussed the procedure including the risks, benefits and alternatives for the proposed anesthesia with  the patient or authorized representative who has indicated his/her understanding and acceptance.     Dental advisory given  Plan Discussed with: CRNA and Anesthesiologist  Anesthesia Plan Comments:         Anesthesia Quick Evaluation

## 2018-10-22 NOTE — Op Note (Signed)
MIDGE MOMON [id number]  [id number]  Preop ZS:MOLMBEM systolic HF Postop Dx same NYHA Class II    Procedure: dual  chamber ICD implantation without intraoperative defibrillation threshold testing  Following the obtaining of informed consent the patient was brought to the electrophysiology laboratory in place of the fluoroscopic table in the supine position. After routine prep and drape, lidocaine was infiltrated in the prepectoral subclavicular region and an incision was made and carried down to the layer of the prepectoral fascia using electrocautery and sharp dissection. A pocket was formed similarly.  Thereafter  attention was turned to gaining access to the extrathoracic left subclavian vein which was accomplished without difficulty and without the aspiration of air or puncture of the artery.Two separate venipunctures were accomplished  Sequentially  A 9 French sheath  And 62F sheath were placed through which were   passed a single coil   active fixation defibrillator lead, model MDT serial number [id number] and a 5076 active fixation atrial lead, serial number [id number] .  They were   passed under fluoroscopic guidance to the right ventricular apex and R atrial appendage  respectively.    In its location the bipolar R wave was 12.6 millivolts, impedance was 646 ohms, the pacing threshold was 0.75 volts at 0.75@0 .4 msec. There was no diaphragmatic pacing at 10 V. The current of injury was brisk.  The bipolar P wave was 6.8 millivolts, impedance was 513 ohms, the pacing threshold was 1.75 volts at 0.4 msec.   There was no diaphragmatic pacing at 10 V. The current of injury was bris.   The leads were secured to the prepectoral fascia and then attached to aMDT ICD, serial number  [id number].  High-voltage impedance was  110 ohms.      The pocket was copiously irrigated with antibiotic containing saline solution. Hemostasis was assured, and the device and the leads were placed in the  pocket and secured to the prepectoral fascia.  The wound was closed in 3 layers in normal fashion. The wound was washed dried and a dermabond benzoin Steri-Strips tegaderm dressing was  applied. Needle counts, sponge counts and instrument counts were correct at the end of the procedure according to the staff.  EBL minimal.

## 2018-10-22 NOTE — Transfer of Care (Signed)
Immediate Anesthesia Transfer of Care Note  Patient: KRISHANA LUTZE  Procedure(s) Performed: ICD GENERATOR INSERTION (Left Chest)  Patient Location: PACU  Anesthesia Type:General  Level of Consciousness: sedated  Airway & Oxygen Therapy: Patient Spontanous Breathing and Patient connected to nasal cannula oxygen  Post-op Assessment: Report given to RN and Post -op Vital signs reviewed and stable  Post vital signs: Reviewed and stable  Last Vitals:  Vitals Value Taken Time  BP 140/92 10/22/2018  2:12 PM  Temp 36.2 C 10/22/2018  2:12 PM  Pulse 81 10/22/2018  2:21 PM  Resp 16 10/22/2018  2:12 PM  SpO2 94 % 10/22/2018  2:21 PM  Vitals shown include unvalidated device data.  Last Pain:  Vitals:   10/22/18 1056  TempSrc: Tympanic  PainSc: 7       Patients Stated Pain Goal: 0 (62/56/38 [id number])  Complications: No apparent anesthesia complications

## 2018-10-22 NOTE — Progress Notes (Addendum)
Pt is complaining of 10 out 10 pain. Last dose of morphine received at 1706 and its every 4 hours PRN. Page prime and talked to Dr. pyreddy and ordered to give a one time dose of morphine 2 mg/ml. Will continue to monitor.  Update 2100: Pt sling was place. Orderly only have the extra-large size for the sling. Will continue to monitor.  Update 0303: Pt refused the sling and states " the size is small for her arm." Pt was educated about the importance of the sling. Will continue to monitor.  Update 0329: Pt did agree to use the blue sling on her left arm. Will continue to monitor.

## 2018-10-22 NOTE — H&P (Signed)
HPI: Erika Rios is a 58 y.o. female with a history of ischemic cardiomyopathy, left ventricular ejection fraction of 25% and New York Heart Association Heart Failure class II symptoms, who presents for evaluation candidacy for primary prophylaxis implantable cardioverter defibrillator therapy. She had been on GDMT for >6 months with no improvement in her EF. She suffered an anterior STEMI in 2017 treated with stent. Prior hx of smoking now with mild copd.   The following portions of the patient's history were reviewed and updated as appropriate. PMH, PSH, SHx, FHx, Allergies, Medications, Problem List  Problem List:  Problem List Date Reviewed: 08/05/2018  ICD-10-CM Priority Class Noted - Resolved  COPD, mild , unspecified (CMS-HCC) J44.9 06/02/2018 - Present  SOB (shortness of breath) on exertion R06.02 02/17/2018 - Present  Diverticulosis of sigmoid colon K57.30 12/01/2017 - Present  HLD (hyperlipidemia) E78.5 10/15/2017 - Present  Low vitamin B12 level E53.8 06/13/2016 - Present  Cardiomyopathy, ischemic I25.5 04/07/2016 - Present  ST elevation myocardial infarction involving left anterior descending (LAD) coronary artery (CMS-HCC) I21.02 04/07/2016 - Present  Status post coronary artery stent placement Z95.5 04/07/2016 - Present  Coronary artery disease involving native coronary artery of native heart without angina pectoris I25.10 03/13/2016 - Present  STEMI (ST elevation myocardial infarction) (CMS-HCC) I21.3 02/24/2016 - Present  RESOLVED: Pedal edema R60.0 02/17/2018 - 06/02/2018  RESOLVED: ST elevation myocardial infarction (STEMI) of anterior wall, initial episode of care (CMS-HCC) I21.09 04/07/2016 - 10/15/2017  RESOLVED: Tobacco abuse Z72.0 04/07/2016 - 06/13/2016  RESOLVED: Unstable angina (CMS-HCC) I20.0 04/01/2016 - 06/13/2016  RESOLVED: Tobacco use Z72.0 Unknown - 06/13/2016    Past Medical History:  Diagnosis Date  . Heart attack (CMS-HCC)  . History of tobacco abuse   Past Surgical  History:  Procedure Laterality Date  . heart stint  . right breast biopsy 2018   Allergies (allergy): Allergies  Allergen Reactions  . Latex Dermatitis  . Morphine Rash  . Oxycodone Anxiety and Other (See Comments)  . Zinc Rash   Medications(cmed): Current Outpatient Medications  Medication Sig Dispense Refill  . acetaminophen (TYLENOL) 500 MG tablet Take 1,000 mg by mouth as needed  . albuterol 90 mcg/actuation inhaler Inhale 2 inhalations into the lungs every 6 (six) hours as needed for Wheezing 8.5 g 3  . aspirin 325 MG EC tablet Take 325 mg by mouth once daily 0  . atorvastatin (LIPITOR) 20 MG tablet Take 1 tablet (20 mg total) by mouth once daily 30 tablet 11  . carvedilol (COREG) 3.125 MG tablet Take 1 tablet (3.125 mg total) by mouth 2 (two) times daily with meals 180 tablet 3  . dicyclomine (BENTYL) 10 mg capsule TAKE 1 CAPSULE (10 MG TOTAL) BY MOUTH 2 (TWO) TIMES DAILY AS NEEDED 30 capsule 2  . lisinopril (PRINIVIL,ZESTRIL) 2.5 MG tablet TAKE 1 TABLET (2.5 MG TOTAL) BY MOUTH ONCE DAILY. 90 tablet 1  . nitroGLYcerin (NITROSTAT) 0.4 MG SL tablet PLACE 1 TABLET UNDER TONGUE EVERY 5 MINUTES IF NEEDED FOR CHEST PAIN UP TO 3 DOSES 12  . pantoprazole (PROTONIX) 40 MG DR tablet TAKE 1 TABLET (40 MG TOTAL) BY MOUTH ONCE DAILY. 90 tablet 1  . spironolactone (ALDACTONE) 25 MG tablet TAKE 0.5 TABLET BY MOUTH ONCE DAILY. 90 tablet 1  . traMADol (ULTRAM) 50 mg tablet Take 50 mg by mouth every 6 (six) hours as needed.   . triamcinolone 0.1 % cream Apply topically 2 (two) times daily Apply to rash twice daily. 30 g 3  No current facility-administered medications for this visit.   Social History   Socioeconomic History  . Marital status: Married  Spouse name: Not on file  . Number of children: Not on file  . Years of education: Not on file  . Highest education level: Not on file  Occupational History  . Not on file  Social Needs  . Financial resource strain: Not on file  . Food  insecurity:  Worry: Not on file  Inability: Not on file  . Transportation needs:  Medical: Not on file  Non-medical: Not on file  Tobacco Use  . Smoking status: Former Research scientist (life sciences)  . Smokeless tobacco: Never Used  . Tobacco comment: pt has not smoked in 3.5 weeks, since her heart attack  Substance and Sexual Activity  . Alcohol use: Yes  Comment: occasionally  . Drug use: Defer  . Sexual activity: Not Currently  Lifestyle  . Physical activity:  Days per week: Not on file  Minutes per session: Not on file  . Stress: Not on file  Relationships  . Social connections:  Talks on phone: Not on file  Gets together: Not on file  Attends religious service: Not on file  Active member of club or organization: Not on file  Attends meetings of clubs or organizations: Not on file  Relationship status: Not on file  Other Topics Concern  . Not on file  Social History Narrative  . Not on file   No family history on file.  Review of Systems: positive if checked   GENERAL HEALTH []  Reduced acitivity []  Appetite change []  Chills or fever [x]  Fatigue [x]  Unexpected weight gain/loss  EYES, EARS, NOSE, THROAT []  Dental problem []  Ear pain/ringing []  Hearing loss []  Facial swelling []  Nose bleeds []  Vision changes []  Trouble swallowing  RESPIRATORY []  Sleep apnea []  Snoring []  Sputum []  Cough []  Chest tightness [x]  Shortness of breath []  Wheezing  CARDIOVASCULAR []  Chest pain or tightness []  Leg swelling []  Palpitations/heart flutters []  Waking up short of breath [x]  Sleeping on an incline/more than one pillow []  Leg pain when walking []  Sores on feet/legs []  Pain in feet at night DOE Leg cramps GASTROINTESTINAL []  Abdominal bloating/swelling []  Abdominal pain []  Red blood on toilet tissue or in bowl []  Blood in stool []  Constipation []  Diarrhea []  Nausea [] Vomiting URINARY []  Difficulty urinating []  Flank pain []  Frequent urination []  Blood in urine []   Nocturia need to urinate more then 1 time at night []  Decreased amount of urine  MUSCULOSKELETAL []  Joint aches that limit exercise [x]  Back pain that limits exercise []  Muscle aches that limit exercise  SKIN []  Color change []  Rash []  Wound  NEUROLOGIC []  Dizziness (room spinning) [x]  Headache [x]  Lightheadedness []  Numbness []  Weakness []  Speech difficulty []  Passing out []  Facial droop Balance issues HEMATOLOGIC []  Bruises/bleeds easily  PSYCHIATRIC []  Anxiety []  Confusion []  Depression []  Sleep disturbance []  Memory trouble  ENDOCRINE []  Thyroid problems Feeling cold   Objective:  LMP (LMP Unknown)   BP Readings from Last 3 Encounters:  08/26/18 108/60  08/05/18 104/60  06/02/18 132/82   Wt Readings from Last 3 Encounters:  08/26/18 97.1 kg (214 lb)  08/05/18 97.1 kg (214 lb)  06/02/18 94.4 kg (208 lb 3.2 oz)   General: Well appearing, in no apparent distress Skin: No rash, embolic phenomenon or bruising HEENT: Sclera and conjunctiva clear; mucosa healthy without drainage or ulceration Neck: Supple; no masses, jugular venous pressure normal  Lungs: Respiration  unlabored; clear to auscultation bilaterally Back: No spinal deformity Cardio: Regular, rate, and rhythm without murmurs, gallops, or rubs; normal S1, S2 Abdomen: Soft; non-tender; not distended; normally active bowel sounds; no masses or organomegaly  Musculoskeletal: No deformity; no active joint inflammation, moves all extremities normally Extremities: No edema, cyanosis, clubbing or varicosities, peripheral pulses palpable and normal Neurologic: Alert and oriented; speech intact; face symmetric; moves all extremities well, gait normal Left handed Data Chemistry   Component Value Date/Time  NA 140 05/26/2018 0713  K 4.2 05/26/2018 0713  CL 105 05/26/2018 0713  CO2 28.4 05/26/2018 0713  BUN 17 05/26/2018 0713  CREATININE 0.9 05/26/2018 0713  GLUCOSE 90 05/26/2018 0713   Component  Value Date/Time  CALCIUM 9.1 05/26/2018 0713  ALKPHOS 80 05/26/2018 0713  AST 15 05/26/2018 0713  ALT 21 05/26/2018 0713  TBILI 0.3 05/26/2018 0713    Lab Results  Component Value Date  WBC 13.7 (H) 05/26/2018  HGB 14.3 05/26/2018  HCT 41.9 05/26/2018  MCV 97.9 05/26/2018  PLT 321 05/26/2018       Diagnoses and all orders for this visit:  Cardiomyopathy, ischemic  Chronic systolic HF (heart failure) (CMS-HCC)   12 lead ECG:  I personally ordered, reviewed, and interpreted the ECG performed on 10/01/2018 showing: rhythm: normal sinus rhythm, rate=63 bpm, PR=158 ms, QRSD=96 ms, QTc=428 ms  Assessment/Plan: Erika Rios is a 58 y.o. female with a history of ICM chronic systolic HF EF 60% who is referred for consideration for primary prophylaxis ICD therapy. Given the patient's history, she is at an increased risk of sudden cardiac death and will likely benefit from an ICD for primary prevention of sudden cardiac death.  I spent some time with her reviewing the potential benefits and risks of the therapy both at the time of the procedure and thereafter. Specifically, I informed her that the potential risks include but are not limited to infection, bleeding, pneumothorax, cardiac perforation with or without tamponade, stroke, myocardial infarction and death as well as the subsequent possibilities of infection of a chronic device, inappropriate shocks and failure to rescue life threatening arrhythmias. She verbalized understanding, and she signed the consent form in clinic today. She knows to be n.p.o. after midnight the night before her procedure. She knows to call us should he have any questions or problems at any time.  Pre-procedure Details - ICD Implantation  Regarding ICD implantation, a discussion was held with the patient with a shared decision-making approach using an evidence-based tool, risks of the procedure and having an indwelling device were delineated and all questions  were answered; she has agreed to proceed and has given consent  primary prevention  ischemic cardiomyopathy  Left ventricular ejection fraction: 25%  NYHA Heart Failure Functional Class II symptoms  Conduction abnormality: normal QRS  QRS duration 96 ms  ICD Indication: ICD therapy is indicated in patients with LVEF less than or equal to 35% due to prior MI who are at least 40 days post-MI and are in NYHA functional Class II or III. (I - A)  CRT Indication: CRT is not indicated  Atrial lead Indication: In patients with symptomatic sinus node dysfunction, an atrial lead is recommended.   Base procedure: Insert ICD system, single or dual transvenous leads (CPT [phone])  Device type: dual-chamber ICD  Device side: Left She is left handed but had no preference for device implantation  Device Vendor: [employer]  Referring MD: See note header  Primary Cardiologist: See note header  Primary Electrophysiologist:  See note header  Procedure Date: 10/22/2018  Patient preference to move up procedure date if available due to cancellation: No   Device Follow-up Physician: Caldwell  Pre-procedural antibiotic prohylaxis: cefazolin  Admission Status: transferred to the observation unit  Same-day Discharge: No  Procedure Status: elective  Anesthesia Plan: Monitored anesthesia care  Urinary Catheter: Urinary Catheter: None  Preoperative Evaluation: Same Day anesthesia evaluation   Preoperative labs: To be done at pre-operative Visit  Basic Metabolic Profile, CBC, PT with INR, Urinalysis, aPTT and Magnesium  Contrast Allergy Premedication: No premedication necessary

## 2018-10-22 NOTE — Anesthesia Post-op Follow-up Note (Signed)
Anesthesia QCDR form completed.        

## 2018-10-23 ENCOUNTER — Observation Stay: Payer: BC Managed Care – PPO

## 2018-10-23 DIAGNOSIS — I5022 Chronic systolic (congestive) heart failure: Secondary | ICD-10-CM | POA: Diagnosis not present

## 2018-10-23 MED ORDER — OXYCODONE-ACETAMINOPHEN 5-325 MG PO TABS: 1.0000 | ORAL_TABLET | ORAL | Status: DC | PRN

## 2018-10-23 MED ORDER — OXYCODONE-ACETAMINOPHEN 5-325 MG PO TABS: 1.0000 | ORAL_TABLET | ORAL | 0 refills | Status: DC | PRN

## 2018-10-23 MED ORDER — ACETAMINOPHEN 325 MG PO TABS: 325.0000 mg | ORAL_TABLET | ORAL | 0 refills | Status: DC | PRN

## 2018-10-23 NOTE — Discharge Summary (Addendum)
Patient status post AICD placement by Dr. Marcello Moores yesterday And appears to be doing reasonably well there was bandage clean no discharge no significant swelling no fever no chills no sweats Patient had has a very low pain tolerance and was asked several times for narcotics Left arm is in a sling which will discontinue upon discharge Patient is to follow-up with cardiologist Dr. Saralyn Pilar in 1 to 2 weeks Exam is unremarkable Vital signs are stable Patient ready for discharge Patient been prescribed Percocet for pain at her request even though her chart says she is allergic to narcotics she says that she is not and she is able to take it.

## 2018-10-23 NOTE — Plan of Care (Signed)
  Problem: Education: Goal: Knowledge of General Education information will improve Description Including pain rating scale, medication(s)/side effects and non-pharmacologic comfort measures Outcome: Progressing   Problem: Health Behavior/Discharge Planning: Goal: Ability to manage health-related needs will improve Outcome: Progressing   Problem: Pain Managment: Goal: General experience of comfort will improve Outcome: Progressing   Problem: Safety: Goal: Ability to remain free from injury will improve Outcome: Progressing   Problem: Education: Goal: Knowledge of cardiac device and self-care will improve Outcome: Progressing

## 2018-10-23 NOTE — Discharge Instructions (Signed)
Status post AICD placement left shoulder Patient in a sling for 24 hours but should not continue at home Restrict use of the left arm Pain medicines as necessary Call if there is any signs of swelling bleeding tenderness or fever Follow-up with cardiologist 1 to 2 weeks

## 2018-10-23 NOTE — Progress Notes (Signed)
Erika Rios to be D/C'd Home per MD order. Patient given discharge teaching and paperwork regarding medications, diet, follow-up appointments and activity. Patient understanding verbalized. No questions or complaints at this time. Skin condition as charted. IV and telemetry removed prior to leaving. Patient to follow-up with Dr. Saralyn Pilar in 1-2 weeks per Dr. Clayborn Bigness.   No further needs by Care Management/Social Work. Prescription given to patient.  An After Visit Summary was printed and given to the patient.   Patient escorted via wheelchair.  [name]

## 2018-10-25 NOTE — Anesthesia Postprocedure Evaluation (Signed)
Anesthesia Post Note  Patient: [name]  Procedure(s) Performed: ICD GENERATOR INSERTION (Left Chest)  Patient location during evaluation: PACU Anesthesia Type: General Level of consciousness: awake and alert and oriented Pain management: pain level controlled Vital Signs Assessment: post-procedure vital signs reviewed and stable Respiratory status: spontaneous breathing, nonlabored ventilation and respiratory function stable Cardiovascular status: blood pressure returned to baseline and stable Postop Assessment: no signs of nausea or vomiting Anesthetic complications: no     Last Vitals:  Vitals:   10/23/18 0407 10/23/18 0757  BP: 124/77 129/72  Pulse: 96 89  Resp:    Temp: 36.9 C 36.9 C  SpO2: 94% 98%    Last Pain:  Vitals:   10/23/18 1054  TempSrc:   PainSc: 5                  Eddy Liszewski

## 2018-10-28 ENCOUNTER — Emergency Department: Payer: BC Managed Care – PPO

## 2018-10-28 ENCOUNTER — Encounter: Payer: Self-pay | Admitting: Emergency Medicine

## 2018-10-28 ENCOUNTER — Other Ambulatory Visit: Payer: Self-pay

## 2018-10-28 ENCOUNTER — Inpatient Hospital Stay
Admission: EM | Admit: 2018-10-28 | Discharge: 2018-11-01 | DRG: 444 | Disposition: A | Discharge status: Home / Self Care | Payer: BC Managed Care – PPO | Attending: Internal Medicine | Admitting: Internal Medicine

## 2018-10-28 DIAGNOSIS — Z8249 Family history of ischemic heart disease and other diseases of the circulatory system: Secondary | ICD-10-CM

## 2018-10-28 DIAGNOSIS — I5022 Chronic systolic (congestive) heart failure: Secondary | ICD-10-CM | POA: Diagnosis present

## 2018-10-28 DIAGNOSIS — Z95810 Presence of automatic (implantable) cardiac defibrillator: Secondary | ICD-10-CM

## 2018-10-28 DIAGNOSIS — E785 Hyperlipidemia, unspecified: Secondary | ICD-10-CM | POA: Diagnosis present

## 2018-10-28 DIAGNOSIS — K750 Abscess of liver: Secondary | ICD-10-CM | POA: Diagnosis present

## 2018-10-28 DIAGNOSIS — Z7982 Long term (current) use of aspirin: Secondary | ICD-10-CM | POA: Diagnosis not present

## 2018-10-28 DIAGNOSIS — Z87891 Personal history of nicotine dependence: Secondary | ICD-10-CM | POA: Diagnosis not present

## 2018-10-28 DIAGNOSIS — Z7952 Long term (current) use of systemic steroids: Secondary | ICD-10-CM | POA: Diagnosis not present

## 2018-10-28 DIAGNOSIS — K219 Gastro-esophageal reflux disease without esophagitis: Secondary | ICD-10-CM | POA: Diagnosis present

## 2018-10-28 DIAGNOSIS — K8000 Calculus of gallbladder with acute cholecystitis without obstruction: Secondary | ICD-10-CM | POA: Diagnosis present

## 2018-10-28 DIAGNOSIS — K810 Acute cholecystitis: Secondary | ICD-10-CM | POA: Diagnosis present

## 2018-10-28 DIAGNOSIS — I110 Hypertensive heart disease with heart failure: Secondary | ICD-10-CM | POA: Diagnosis present

## 2018-10-28 DIAGNOSIS — K760 Fatty (change of) liver, not elsewhere classified: Secondary | ICD-10-CM | POA: Diagnosis present

## 2018-10-28 DIAGNOSIS — I2510 Atherosclerotic heart disease of native coronary artery without angina pectoris: Secondary | ICD-10-CM | POA: Diagnosis present

## 2018-10-28 DIAGNOSIS — Z79899 Other long term (current) drug therapy: Secondary | ICD-10-CM | POA: Diagnosis not present

## 2018-10-28 DIAGNOSIS — I252 Old myocardial infarction: Secondary | ICD-10-CM | POA: Diagnosis not present

## 2018-10-28 DIAGNOSIS — I255 Ischemic cardiomyopathy: Secondary | ICD-10-CM | POA: Diagnosis present

## 2018-10-28 DIAGNOSIS — K819 Cholecystitis, unspecified: Secondary | ICD-10-CM

## 2018-10-28 DIAGNOSIS — Z9049 Acquired absence of other specified parts of digestive tract: Secondary | ICD-10-CM

## 2018-10-28 DIAGNOSIS — Z6834 Body mass index (BMI) 34.0-34.9, adult: Secondary | ICD-10-CM | POA: Diagnosis not present

## 2018-10-28 DIAGNOSIS — Z955 Presence of coronary angioplasty implant and graft: Secondary | ICD-10-CM

## 2018-10-28 LAB — COMPREHENSIVE METABOLIC PANEL
ALT: 58 U/L — ABNORMAL HIGH (ref 0–44)
AST: 95 U/L — ABNORMAL HIGH (ref 15–41)
Albumin: 3.8 g/dL (ref 3.5–5.0)
Alkaline Phosphatase: 107 U/L (ref 38–126)
Anion gap: 9 (ref 5–15)
BUN: 12 mg/dL (ref 6–20)
CO2: 24 mmol/L (ref 22–32)
Calcium: 9.2 mg/dL (ref 8.9–10.3)
Chloride: 100 mmol/L (ref 98–111)
Creatinine, Ser: 0.67 mg/dL (ref 0.44–1.00)
Glucose, Bld: 148 mg/dL — ABNORMAL HIGH (ref 70–99)
Potassium: 4.6 mmol/L (ref 3.5–5.1)
Sodium: 133 mmol/L — ABNORMAL LOW (ref 135–145)
TOTAL PROTEIN: 7.5 g/dL (ref 6.5–8.1)
Total Bilirubin: 0.7 mg/dL (ref 0.3–1.2)

## 2018-10-28 LAB — LIPASE, BLOOD: Lipase: 26 U/L (ref 11–51)

## 2018-10-28 LAB — URINALYSIS, COMPLETE (UACMP) WITH MICROSCOPIC
Bacteria, UA: NONE SEEN
Bilirubin Urine: NEGATIVE
Glucose, UA: NEGATIVE mg/dL
Hgb urine dipstick: NEGATIVE
Ketones, ur: NEGATIVE mg/dL
Leukocytes,Ua: NEGATIVE
Nitrite: NEGATIVE
PH: 5 (ref 5.0–8.0)
Protein, ur: NEGATIVE mg/dL
Specific Gravity, Urine: 1.023 (ref 1.005–1.030)

## 2018-10-28 LAB — CBC
HCT: 44.1 % (ref 36.0–46.0)
Hemoglobin: 15.1 g/dL — ABNORMAL HIGH (ref 12.0–15.0)
MCH: 33.6 pg (ref 26.0–34.0)
MCHC: 34.2 g/dL (ref 30.0–36.0)
MCV: 98.2 fL (ref 80.0–100.0)
Platelets: 310 10*3/uL (ref 150–400)
RBC: 4.49 MIL/uL (ref 3.87–5.11)
RDW: 12.6 % (ref 11.5–15.5)
WBC: 14.8 10*3/uL — ABNORMAL HIGH (ref 4.0–10.5)
nRBC: 0 % (ref 0.0–0.2)

## 2018-10-28 LAB — TROPONIN I: Troponin I: 0.03 ng/mL (ref <0.03)

## 2018-10-28 MED ORDER — CARVEDILOL 3.125 MG PO TABS
1.5600 mg | ORAL_TABLET | Freq: Two times a day (BID) | ORAL | Status: DC
  Filled 2018-10-28: qty 1

## 2018-10-28 MED ORDER — ONDANSETRON HCL 4 MG/2ML IJ SOLN
4.0000 mg | Freq: Four times a day (QID) | INTRAMUSCULAR | Status: DC | PRN
  Administered 2018-10-29 – 2018-10-31 (×3): 4 mg via INTRAVENOUS
  Filled 2018-10-28 (×3): qty 2

## 2018-10-28 MED ORDER — SODIUM CHLORIDE 0.9% FLUSH
3.0000 mL | Freq: Two times a day (BID) | INTRAVENOUS | Status: DC
  Administered 2018-10-28 – 2018-11-01 (×8): 3 mL via INTRAVENOUS

## 2018-10-28 MED ORDER — SODIUM CHLORIDE 0.9% FLUSH: 3.0000 mL | Freq: Once | INTRAVENOUS | Status: DC

## 2018-10-28 MED ORDER — ONDANSETRON HCL 4 MG/2ML IJ SOLN
4.0000 mg | Freq: Once | INTRAMUSCULAR | Status: AC
  Administered 2018-10-28: 4 mg via INTRAVENOUS
  Filled 2018-10-28: qty 2

## 2018-10-28 MED ORDER — LISINOPRIL 5 MG PO TABS
2.5000 mg | ORAL_TABLET | Freq: Every day | ORAL | Status: DC
  Administered 2018-10-29 – 2018-11-01 (×4): 2.5 mg via ORAL
  Filled 2018-10-28 (×4): qty 1

## 2018-10-28 MED ORDER — SODIUM CHLORIDE 0.9 % IV SOLN: 250.0000 mL | INTRAVENOUS | Status: DC | PRN

## 2018-10-28 MED ORDER — ATORVASTATIN CALCIUM 20 MG PO TABS
20.0000 mg | ORAL_TABLET | ORAL | Status: DC
  Administered 2018-10-28 – 2018-10-30 (×2): 20 mg via ORAL
  Filled 2018-10-28 (×2): qty 1

## 2018-10-28 MED ORDER — HYDROMORPHONE HCL 1 MG/ML IJ SOLN
1.0000 mg | Freq: Once | INTRAMUSCULAR | Status: AC
  Administered 2018-10-28: 1 mg via INTRAVENOUS
  Filled 2018-10-28: qty 1

## 2018-10-28 MED ORDER — NITROGLYCERIN 0.4 MG SL SUBL: 0.4000 mg | SUBLINGUAL_TABLET | SUBLINGUAL | Status: DC | PRN

## 2018-10-28 MED ORDER — ONDANSETRON HCL 4 MG PO TABS: 4.0000 mg | ORAL_TABLET | Freq: Four times a day (QID) | ORAL | Status: DC | PRN

## 2018-10-28 MED ORDER — OXYCODONE-ACETAMINOPHEN 5-325 MG PO TABS
1.0000 | ORAL_TABLET | ORAL | Status: DC | PRN
  Administered 2018-10-30: 2 via ORAL
  Administered 2018-10-30: 1 via ORAL
  Administered 2018-10-30: 2 via ORAL
  Filled 2018-10-28 (×4): qty 2

## 2018-10-28 MED ORDER — PIPERACILLIN-TAZOBACTAM 3.375 G IVPB 30 MIN
3.3750 g | Freq: Once | INTRAVENOUS | Status: AC
  Administered 2018-10-28: 3.375 g via INTRAVENOUS
  Filled 2018-10-28: qty 50

## 2018-10-28 MED ORDER — ACETAMINOPHEN 650 MG RE SUPP: 650.0000 mg | Freq: Four times a day (QID) | RECTAL | Status: DC | PRN

## 2018-10-28 MED ORDER — HYDROMORPHONE HCL 1 MG/ML IJ SOLN
1.0000 mg | INTRAMUSCULAR | Status: DC | PRN
  Administered 2018-10-28 – 2018-11-01 (×14): 1 mg via INTRAVENOUS
  Filled 2018-10-28 (×14): qty 1

## 2018-10-28 MED ORDER — SODIUM CHLORIDE 0.9% FLUSH: 3.0000 mL | INTRAVENOUS | Status: DC | PRN

## 2018-10-28 MED ORDER — ACETAMINOPHEN 325 MG PO TABS
650.0000 mg | ORAL_TABLET | Freq: Four times a day (QID) | ORAL | Status: DC | PRN
  Administered 2018-10-29 – 2018-10-30 (×3): 650 mg via ORAL
  Filled 2018-10-28 (×3): qty 2

## 2018-10-28 MED ORDER — SPIRONOLACTONE 25 MG PO TABS
12.5000 mg | ORAL_TABLET | Freq: Every day | ORAL | Status: DC
  Administered 2018-10-29 – 2018-11-01 (×4): 12.5 mg via ORAL
  Filled 2018-10-28: qty 0.5
  Filled 2018-10-28 (×2): qty 1
  Filled 2018-10-28 (×2): qty 0.5
  Filled 2018-10-28: qty 1
  Filled 2018-10-28: qty 0.5
  Filled 2018-10-28: qty 1

## 2018-10-28 MED ORDER — HEPARIN SODIUM (PORCINE) 5000 UNIT/ML IJ SOLN
5000.0000 [IU] | Freq: Three times a day (TID) | INTRAMUSCULAR | Status: DC
  Administered 2018-10-28 – 2018-10-29 (×3): 5000 [IU] via SUBCUTANEOUS
  Filled 2018-10-28 (×3): qty 1

## 2018-10-28 MED ORDER — PIPERACILLIN-TAZOBACTAM 3.375 G IVPB
3.3750 g | Freq: Three times a day (TID) | INTRAVENOUS | Status: DC
  Administered 2018-10-29 – 2018-11-01 (×11): 3.375 g via INTRAVENOUS
  Filled 2018-10-28 (×11): qty 50

## 2018-10-28 NOTE — ED Triage Notes (Signed)
PT sent from PCP for upper epigastric pain x2wks. PT recently had ICD placed from cardiologist. PT states she had CT scan last week for same pain and neg for gallstones. VSS

## 2018-10-28 NOTE — ED Provider Notes (Signed)
EKG reviewed. Patient coming immediately to room 11. EKG discussed with Dr. Cherylann Banas. Dr. Josefa Half paged. History from PCP of 2 weeks pain. Unclear if this is a STEMI or acute ACS.    Erika Kitten, MD 10/28/18 1626

## 2018-10-28 NOTE — ED Notes (Signed)
ED TO INPATIENT HANDOFF REPORT  Name/Age/Gender Erika Rios 58 y.o. female  Code Status Code Status History    Date Active Date Inactive Code Status Order ID Comments User Context   10/22/2018 1529 10/23/2018 1418 Full Code [id number]  Marzetta Board, MD Inpatient   04/01/2016 1446 04/02/2016 1307 Full Code [id number]  Nicholes Mango, MD Inpatient   02/24/2016 1541 02/28/2016 2020 Full Code [id number]  Eileen Stanford PA-C Inpatient      Home/SNF/Other Home  Chief Complaint referred by Dr. Ginette Pitman, Poss gallstones  Level of Care/Admitting Diagnosis ED Disposition    ED Disposition Condition [place]: [place] [[id number]]  Level of Care: Med-Surg [16]  Diagnosis: Acute cholecystitis [575.0.ICD-9-CM]  Admitting Physician: Dustin Flock [[id number]]  Attending Physician: Dustin Flock [[id number]]  Estimated length of stay: past midnight tomorrow  Certification:: I certify this patient will need inpatient services for at least 2 midnights  PT Class (Do Not Modify): Inpatient [101]  PT Acc Code (Do Not Modify): Private [1]       Medical History Past Medical History:  Diagnosis Date  . CHF (congestive heart failure) ([place])   . GERD (gastroesophageal reflux disease)   . Hypertension   . ST elevation myocardial infarction (STEMI) of anterior wall, initial episode of care ([place]) 2017  . Tobacco abuse    quit smoking 2017    Allergies Allergies  Allergen Reactions  . Statins Other (See Comments)    Hip pain, constant  . Elastic Bandages & [Zinc] Rash    Paper tape should be okay  . Iron Nausea And Vomiting  . Latex Dermatitis  . Nsaids     Told not to take d/t heart status  . Oxycodone Anxiety    IV Location/Drains/Wounds Patient Lines/Drains/Airways Status   Active Line/Drains/Airways    Name:   Placement date:   Placement time:   Site:   Days:   Peripheral IV 10/28/18 Right Forearm   10/28/18    1646    Forearm   less than 1    Incision (Closed) 10/22/18 Chest Left   10/22/18    1348     6          Labs/Imaging Results for orders placed or performed during the hospital encounter of 10/28/18 (from the past 48 hour(s))  Urinalysis, Complete w Microscopic     Status: Abnormal   Collection Time: 10/28/18  4:16 PM  Result Value Ref Range   Color, Urine YELLOW (A) YELLOW   APPearance CLEAR (A) CLEAR   Specific Gravity, Urine 1.023 1.005 - 1.030   pH 5.0 5.0 - 8.0   Glucose, UA NEGATIVE NEGATIVE mg/dL   Hgb urine dipstick NEGATIVE NEGATIVE   Bilirubin Urine NEGATIVE NEGATIVE   Ketones, ur NEGATIVE NEGATIVE mg/dL   Protein, ur NEGATIVE NEGATIVE mg/dL   Nitrite NEGATIVE NEGATIVE   Leukocytes,Ua NEGATIVE NEGATIVE   RBC / HPF 6-10 0 - 5 RBC/hpf   WBC, UA 0-5 0 - 5 WBC/hpf   Bacteria, UA NONE SEEN NONE SEEN   Squamous Epithelial / LPF 0-5 0 - 5   Mucus PRESENT     Comment: Performed at [facility], [address] [place], [place] [address]  Lipase, blood     Status: None   Collection Time: 10/28/18  4:43 PM  Result Value Ref Range   Lipase 26 11 - 51 U/L    Comment: Performed at [facility], [address] [place], [place] [address]  Comprehensive metabolic panel     Status: Abnormal   Collection Time: 10/28/18  4:43 PM  Result Value Ref Range   Sodium 133 (L) 135 - 145 mmol/L   Potassium 4.6 3.5 - 5.1 mmol/L   Chloride 100 98 - 111 mmol/L   CO2 24 22 - 32 mmol/L   Glucose, Bld 148 (H) 70 - 99 mg/dL   BUN 12 6 - 20 mg/dL   Creatinine, Ser 0.67 0.44 - 1.00 mg/dL   Calcium 9.2 8.9 - 10.3 mg/dL   Total Protein 7.5 6.5 - 8.1 g/dL   Albumin 3.8 3.5 - 5.0 g/dL   AST 95 (H) 15 - 41 U/L   ALT 58 (H) 0 - 44 U/L   Alkaline Phosphatase 107 38 - 126 U/L   Total Bilirubin 0.7 0.3 - 1.2 mg/dL   GFR calc non Af Amer >60 >60 mL/min   GFR calc Af Amer >60 >60 mL/min   Anion gap 9 5 - 15    Comment: Performed at [facility], [address] [place], [place] [address]  CBC      Status: Abnormal   Collection Time: 10/28/18  4:43 PM  Result Value Ref Range   WBC 14.8 (H) 4.0 - 10.5 K/uL   RBC 4.49 3.87 - 5.11 MIL/uL   Hemoglobin 15.1 (H) 12.0 - 15.0 g/dL   HCT 44.1 36.0 - 46.0 %   MCV 98.2 80.0 - 100.0 fL   MCH 33.6 26.0 - 34.0 pg   MCHC 34.2 30.0 - 36.0 g/dL   RDW 12.6 11.5 - 15.5 %   Platelets 310 150 - 400 K/uL   nRBC 0.0 0.0 - 0.2 %    Comment: Performed at [facility], [address] [place], [place] [address]  Troponin I - Once     Status: Abnormal   Collection Time: 10/28/18  4:43 PM  Result Value Ref Range   Troponin I 0.03 (HH) <0.03 ng/mL    Comment: CRITICAL RESULT CALLED TO, READ BACK BY AND VERIFIED WITH GRACIE [facility] @ 2025 ON 10/28/18 BY JUW Performed at [facility], [address], [place], [place] [address]    US Abdomen Limited Ruq  Result Date: 10/28/2018 CLINICAL DATA:  Right upper quadrant abdominal pain for 1 week. EXAM: ULTRASOUND ABDOMEN LIMITED RIGHT UPPER QUADRANT COMPARISON:  None. FINDINGS: Gallbladder: Moderately distended. 3 cm stone in the gallbladder neck. Wall is thickened and edematous, 9 mm in thickness. There is a small amount of pericholecystic fluid but no sonographic Murphy's sign. Common bile duct: Diameter: 4 mm. Liver: Increased parenchymal echogenicity. Along the inferior aspect of the liver, right lobe, there is a complex mass or lesion with a cystic central component at mildly hyperechoic peripherally, measuring 3.9 x 3.4 x 3.9 cm. There is a similar-appearing lesion also from the inferior right liver lobe, measuring 3.2 x 2.2 x 2.4 cm. These lesions are not evident on the prior CT. Portal vein is patent on color Doppler imaging with normal direction of blood flow towards the liver. IMPRESSION: 1. Large gallstone in the gallbladder neck associated with gallbladder wall thickening and edema and a small amount of pericholecystic fluid. Despite the lack of a sonographic Murphy sign, acute cholecystitis  is suspected. 2. Two complex lesions are noted along the inferior margin of the right liver lobe, both of which have anechoic/cystic central areas. These may reflect abscesses. They are new since the prior abdomen and pelvis CT.  3. Hepatic steatosis. Electronically Signed   By: Lajean Manes M.D.   On: 10/28/2018 19:28    Pending Labs [employer] (From admission, onward)    Start     Ordered   Signed and Held  CBC  (heparin)  Once,   R    Comments:  Baseline for heparin therapy IF NOT ALREADY DRAWN.  Notify MD if PLT < 100 K.    Signed and Held   Signed and Held  Creatinine, serum  (heparin)  Once,   R    Comments:  Baseline for heparin therapy IF NOT ALREADY DRAWN.    Signed and Held   Signed and Held  CBC  Tomorrow morning,   R     Signed and Held   Signed and Held  Basic metabolic panel  Tomorrow morning,   R     Signed and Held          Vitals/Pain Today's Vitals   10/28/18 1930 10/28/18 2000 10/28/18 2003 10/28/18 2030  BP: (!) 156/84 110/60  134/74  Pulse: 87 100  95  Resp:      Temp:      TempSrc:      SpO2: 97% (!) 89%  96%  PainSc: 8   6      Isolation Precautions No active isolations  Medications Medications  sodium chloride flush (NS) 0.9 % injection 3 mL (3 mLs Intravenous Refused 10/28/18 1647)  HYDROmorphone (DILAUDID) injection 1 mg (has no administration in time range)  piperacillin-tazobactam (ZOSYN) IVPB 3.375 g (has no administration in time range)  HYDROmorphone (DILAUDID) injection 1 mg (1 mg Intravenous Given 10/28/18 1650)  ondansetron (ZOFRAN) injection 4 mg (4 mg Intravenous Given 10/28/18 1649)  HYDROmorphone (DILAUDID) injection 1 mg (1 mg Intravenous Given 10/28/18 1931)  piperacillin-tazobactam (ZOSYN) IVPB 3.375 g (0 g Intravenous Stopped 10/28/18 2048)    Mobility walks

## 2018-10-28 NOTE — ED Provider Notes (Signed)
[facility] Emergency Department Provider Note ____________________________________________   First MD Initiated Contact with Patient 10/28/18 1624     (approximate)  I have reviewed the triage vital signs and the nursing notes.   HISTORY  Chief Complaint Abdominal Pain    HPI Erika Rios is a 58 y.o. female with PMH as noted below including CAD and prior MI with recent ICD placement on 10/22/2018 who presents with epigastric pain over the last 2 weeks, persistent course, acutely worsened this morning, nonradiating, and associated with nausea and vomiting most recently several hours ago.  She denies shortness of breath, fever, or change in her bowel movements.  There is no chest pain.  Patient states that she had a CT about 10 days ago to evaluate for this pain but it was negative.  He saw her PMD Dr. Ginette Pitman today who referred her to the emergency department.   Past Medical History:  Diagnosis Date  . CHF (congestive heart failure) ([place])   . GERD (gastroesophageal reflux disease)   . Hypertension   . ST elevation myocardial infarction (STEMI) of anterior wall, initial episode of care ([place]) 2017  . Tobacco abuse    quit smoking 2017    Patient Active Problem List   Diagnosis Date Noted  . Acute cholecystitis 10/28/2018  . Chronic systolic heart failure ([place]) 10/22/2018  . Unstable angina ([place]) 04/01/2016  . Status post coronary artery stent placement   . Cardiomyopathy, ischemic   . STEMI (ST elevation myocardial infarction) ([place]) 02/24/2016  . Tobacco abuse   . ST elevation myocardial infarction (STEMI) of anterior wall, initial episode of care ([place])   . ST elevation myocardial infarction involving left anterior descending (LAD) coronary artery [facility])     Past Surgical History:  Procedure Laterality Date  . BREAST BIOPSY Right 12/10/2016   chip remains in breast. benign  . CARDIAC CATHETERIZATION N/A 02/24/2016   Procedure: Left Heart Cath and  Coronary Angiography;  Surgeon: Troy Sine, MD;  Location: [place] [facility];  Service: Cardiovascular;  Laterality: N/A;  . CARDIAC CATHETERIZATION N/A 02/24/2016   Procedure: Coronary Stent Intervention;  Surgeon: Troy Sine, MD;  Location: [place] [facility];  Service: Cardiovascular;  Laterality: N/A;  . IMPLANTABLE CARDIOVERTER DEFIBRILLATOR (ICD) GENERATOR CHANGE Left 10/22/2018   Procedure: ICD GENERATOR INSERTION;  Surgeon: Marzetta Board, MD;  Location: [facility] ORS;  Service: Cardiovascular;  Laterality: Left;    Prior to Admission medications   Medication Sig Start Date End Date Taking? Authorizing Provider  acetaminophen (TYLENOL) 325 MG tablet Take 1-2 tablets (325-650 mg total) by mouth every 4 (four) hours as needed for mild pain. 10/23/18   Callwood, Karma Greaser D, MD  aspirin EC 325 MG EC tablet Take 1 tablet (325 mg total) by mouth 2 (two) times daily. Patient taking differently: Take 325 mg by mouth daily. Once a day in the morning 04/02/16   Hillary Bow, MD  atorvastatin (LIPITOR) 20 MG tablet Take 20 mg by mouth every other day. Patient has hip pain from this and takes rarely    [provider]  carvedilol (COREG) 3.125 MG tablet Take 0.5 tablets (1.56 mg total) by mouth 2 (two) times daily with a meal. PLEASE CONTACT OFFICE FOR ADDITIONAL REFILLS Patient taking differently: Take 3.125 mg by mouth 2 (two) times daily.  09/22/16   Troy Sine, MD  Cyanocobalamin (B-12) 5000 MCG CAPS Take 5,000 mcg by mouth daily.     [provider]  dicyclomine (BENTYL) 20 MG tablet Take 1 tablet (20 mg total) by mouth 3 (three) times daily as needed for spasms. 10/18/18 10/18/19  Harvest Dark, MD  lisinopril (PRINIVIL,ZESTRIL) 2.5 MG tablet Take 1 tablet (2.5 mg total) by mouth daily. PLEASE CONTACT OFFICE FOR ADDITIONAL REFILLS Patient taking differently: Take 2.5 mg by mouth daily. In the morning 09/22/16   Troy Sine, MD  Magnesium 250 MG TABS Take 250 mg by  mouth daily.    [provider]  nitroGLYCERIN (NITROSTAT) 0.4 MG SL tablet PLACE 1 TABLET UNDER TONGUE EVERY 5 MINUTES IF NEEDED FOR CHEST PAIN UP TO 3 DOSES Patient taking differently: Place 0.4 mg under the tongue every 5 (five) minutes as needed for chest pain.  08/26/17   Bhagat, Bhavinkumar, PA  ondansetron (ZOFRAN ODT) 4 MG disintegrating tablet Take 1 tablet (4 mg total) by mouth every 8 (eight) hours as needed for nausea or vomiting. 10/18/18   Harvest Dark, MD  oxyCODONE-acetaminophen (PERCOCET/ROXICET) 5-325 MG tablet Take 1-2 tablets by mouth every 4 (four) hours as needed for severe pain. 10/23/18   Callwood, Dwayne D, MD  pantoprazole (PROTONIX) 40 MG tablet Take 1 tablet (40 mg total) by mouth 2 (two) times daily before a meal. Patient taking differently: Take 40 mg by mouth daily.  04/02/16   Hillary Bow, MD  Potassium 99 MG TABS Take 198 mg by mouth every evening.    [provider]  spironolactone (ALDACTONE) 25 MG tablet Take 0.5 tablets (12.5 mg total) by mouth daily. Patient taking differently: Take 12.5 mg by mouth at bedtime.  02/28/16   Bhagat, Crista Luria, PA  triamcinolone cream (KENALOG) 0.1 % Apply 1 application topically daily. 06/02/18   [provider]    Allergies Statins; Elastic bandages & [zinc]; Iron; Latex; Nsaids; and Oxycodone  Family History  Problem Relation Age of Onset  . Cancer Mother   . Heart disease Mother   . Heart attack Mother   . Breast cancer Mother   . Diabetes Father     Social History Social History   Tobacco Use  . Smoking status: Former Smoker    Packs/day: 0.50    Years: 34.00    Pack years: 17.00    Types: Cigarettes    Last attempt to quit: 03/19/2016    Years since quitting: 2.6  . Smokeless tobacco: Never Used  Substance Use Topics  . Alcohol use: No    Alcohol/week: 0.0 standard drinks  . Drug use: No    Review of Systems  Constitutional: No fever. Eyes: No redness. ENT: No neck  pain. Cardiovascular: Denies chest pain. Respiratory: Denies shortness of breath. Gastrointestinal: Positive for nausea and vomiting.  Genitourinary: Negative for flank pain.  Musculoskeletal: Negative for back pain. Skin: Negative for rash. Neurological: Negative for headachenumbness.   ____________________________________________   PHYSICAL EXAM:  VITAL SIGNS: ED Triage Vitals  Enc Vitals Group     BP 10/28/18 1614 135/90     Pulse Rate 10/28/18 1614 (!) 104     Resp 10/28/18 1614 16     Temp 10/28/18 1614 98.1 F (36.7 C)     Temp Source 10/28/18 1614 Oral     SpO2 10/28/18 1614 98 %     Weight --      Height --      Head Circumference --      Peak Flow --      Pain Score 10/28/18 1615 8     Pain Loc --  Pain Edu? --      Excl. in [place]? --     Constitutional: Alert and oriented.  Uncomfortable appearing but in no acute distress. Eyes: Conjunctivae are normal.  Head: Atraumatic. Nose: No congestion/rhinnorhea. Mouth/Throat: Mucous membranes are moist.   Neck: Normal range of motion.  Cardiovascular: Normal rate, regular rhythm. Grossly normal heart sounds.  Good peripheral circulation.  Left anterior chest wall AICD site healing well. Respiratory: Normal respiratory effort.  No retractions. Lungs CTAB. Gastrointestinal: Soft with moderate epigastric tenderness. No distention.  Genitourinary: No flank tenderness. Musculoskeletal: No lower extremity edema.  Extremities warm and well perfused.  Neurologic:  Normal speech and language. No gross focal neurologic deficits are appreciated.  Skin:  Skin is warm and dry. No rash noted. Psychiatric: Anxious appearing.  ____________________________________________   LABS (all labs ordered are listed, but only abnormal results are displayed)  Labs Reviewed  COMPREHENSIVE METABOLIC PANEL - Abnormal; Notable for the following components:      Result Value   Sodium 133 (*)    Glucose, Bld 148 (*)    AST 95 (*)    ALT  58 (*)    All other components within normal limits  CBC - Abnormal; Notable for the following components:   WBC 14.8 (*)    Hemoglobin 15.1 (*)    All other components within normal limits  URINALYSIS, COMPLETE (UACMP) WITH MICROSCOPIC - Abnormal; Notable for the following components:   Color, Urine YELLOW (*)    APPearance CLEAR (*)    All other components within normal limits  TROPONIN I - Abnormal; Notable for the following components:   Troponin I 0.03 (*)    All other components within normal limits  LIPASE, BLOOD   ____________________________________________  EKG  ED ECG REPORT I, Arta Silence, the attending physician, personally viewed and interpreted this ECG.  Date: 10/28/2018 EKG Time: 1620 Rate: 108 Rhythm: Sinus tachycardia QRS Axis: normal Intervals: normal ST/T Wave abnormalities: Approximately 1 mm ST elevation in lead I and aVL Narrative Interpretation: Similar morphology but more prominent ST elevation in I and aVL when compared to EKG of 10/08/2018  ____________________________________________  RADIOLOGY  [place] RUQ: Findings consistent with possible acute cholecystitis  ____________________________________________   PROCEDURES  Procedure(s) performed: No  Procedures  Critical Care performed: Yes  CRITICAL CARE Performed by: Arta Silence   Total critical care time: 30 minutes  Critical care time was exclusive of separately billable procedures and treating other patients.  Critical care was necessary to treat or prevent imminent or life-threatening deterioration.  Critical care was time spent personally by me on the following activities: development of treatment plan with patient and/or surrogate as well as nursing, discussions with consultants, evaluation of patient's response to treatment, examination of patient, obtaining history from patient or surrogate, ordering and performing treatments and interventions, ordering and review of  laboratory studies, ordering and review of radiographic studies, pulse oximetry and re-evaluation of patient's condition. ____________________________________________   INITIAL IMPRESSION / ASSESSMENT AND PLAN / ED COURSE  Pertinent labs & imaging results that were available during my care of the patient were reviewed by me and considered in my medical decision making (see chart for details).  58 year old female with PMH as noted above including CAD with prior history of STEMI as well as a recent admission with ICD placement presents with epigastric pain over the last 2 weeks, acutely worsened today and associated with nausea and vomiting.  I reviewed the past medical records in [place].  The  patient was evaluated in the ED on 10/18/2022 generalized abdominal pain and took antibiotics for presumed diverticulitis.  CT abdomen showed no concerning acute findings.  The patient reports that this is the same pain as she has been having since then although it worsened today.  She subsequently had dual-chamber ICD placement on 9/51/8841 with no complications and was discharged the following day.  Today, the patient saw her PMD Dr. Ginette Pitman.  He contacted Dr. Jacqualine Code in the ED to expect the patient.  EKG obtained from triage was concerning for ST elevations in lead I and aVL, not meeting STEMI criteria.  Dr. Jacqualine Code contacted Dr. Saralyn Pilar from cardiology prior to the patient arriving to the room.  I evaluated the patient as soon as she came to the room.    On exam the patient is uncomfortable but not acutely ill-appearing.  She is borderline tachycardic.  Her other vital signs are normal.  Her abdomen is soft with some epigastric discomfort to palpation.  The remainder of the exam is unremarkable.  I called back Dr. Saralyn Pilar at approximately 4:40 PM to discuss the EKG and decide whether the patient required evaluation for STEMI.  Her prior EKGs show somewhat similar morphology in the lateral leads and Dr. Saralyn Pilar  advises that based on the EKG findings and her symptoms, he would not recommend evaluation for emergent cardiac catheterization at this time.  Differential includes ACS, cholecystitis, biliary colic, gastritis, PUD, or other intra-abdominal cause.  We will closely monitor the patient, give analgesia, obtain labs, and reassess.  ----------------------------------------- 5:50 PM on 10/28/2018 -----------------------------------------  Lab work-up so far reveals elevated WBC count and slightly elevated LFTs which are new.  Troponin has still not resulted.  The patient states that her pain has significantly improved after Dilaudid.  Given the work-up so far and the location of the pain as well as the known gallstone seen on recent CT I will obtain an ultrasound to rule out acute cholecystitis.  ----------------------------------------- 8:36 PM on 10/28/2018 -----------------------------------------  CT shows findings compatible with acute cholecystitis.  I consulted Dr. Hampton Abbot from general surgery.  He advised that given the patient's extensive cardiac history, low EF, and recent ICD placement she will need medical evaluation and clearance by cardiology.  He recommended that I admit the patient to the hospitalist and his service will follow.  He recommended that we start Zosyn for now, as he will likely treat the patient nonoperatively to start.  I signed the patient out to the hospitalist Dr. Posey Pronto. ____________________________________________   FINAL CLINICAL IMPRESSION(S) / ED DIAGNOSES  Final diagnoses:  Cholecystitis  Acute cholecystitis      NEW MEDICATIONS STARTED DURING THIS VISIT:  New Prescriptions   No medications on file     Note:  This document was prepared using Dragon voice recognition software and may include unintentional dictation errors.    Arta Silence, MD 10/28/18 2036

## 2018-10-28 NOTE — ED Provider Notes (Signed)
Dr. Terrence Dupont. (PCP) Called. Upper abd pain. PCP feels likely gallbladder etiology. Sending to ER for evaluation of abd pain.    Delman Kitten, MD 10/28/18 1540

## 2018-10-28 NOTE — Progress Notes (Signed)
Pharmacy Antibiotic Note  Erika Rios is a 58 y.o. female admitted on 10/28/2018 with intra abdominal.  Pharmacy has been consulted for Zosyn dosing.  Plan: Zosyn 3.375 gm IV Q8H EI ordered to start on 2/28 @ 0200.      Temp (24hrs), Avg:98.1 F (36.7 C), Min:98.1 F (36.7 C), Max:98.1 F (36.7 C)  Recent Labs  Lab 10/28/18 1643  WBC 14.8*  CREATININE 0.67    Estimated Creatinine Clearance: 83.8 mL/min (by C-G formula based on SCr of 0.67 mg/dL).    Allergies  Allergen Reactions  . Statins Other (See Comments)    Hip pain, constant  . Elastic Bandages & [Zinc] Rash    Paper tape should be okay  . Iron Nausea And Vomiting  . Latex Dermatitis  . Nsaids     Told not to take d/t heart status  . Oxycodone Anxiety    Antimicrobials this admission:   >>    >>   Dose adjustments this admission:   Microbiology results:  BCx:   UCx:    Sputum:    MRSA PCR:   Thank you for allowing pharmacy to be a part of this patient's care.  Ezequias Lard D 10/28/2018 9:02 PM

## 2018-10-28 NOTE — Progress Notes (Signed)
10/28/18  ED called for consult on this patient with a 1-2 week history of worsening abdominal pain.  She presented to ED on 2/17 with abdominal pain and her workup revealed cholelithiasis.  At that point she was having 5 days of abdominal pain with intermittent nausea and vomiting.  Today Dr. Cherylann Banas reports her pain being over the past two weeks and worsened acutely today.  Her U/S shows cholecystitis with a significant wall thickening of 9 mm and surrounding pericholecystic fluid.  She has a recent ICD placement on 2/22 by Dr. Marcello Moores and is on full dose ASA.  Her EF is 25% on echo from 08/2018.  At this point, she is not a good surgical candidate.  There is likely to be a lot of inflammation in the right upper quadrant that would make surgery very difficult, and given her other comorbidities, may be too stressful for her with a higher chance of converting to an open surgery.  Would recommend admission to medical team, cardiac optimization, start IV antibiotics, keep NPO.  If no improvement, she may need a percutaneous drain by IR.  Full consult to follow in the morning.   Olean Ree, MD

## 2018-10-28 NOTE — ED Notes (Addendum)
Date and time results received: 10/28/18 6:28 PM  (use smartphrase ".now" to insert current time)  Test: trop Critical Value: 0.03  Name of Provider Notified: [email or web address]  Orders Received? Or Actions Taken?: Orders Received - See Orders for details

## 2018-10-28 NOTE — H&P (Signed)
[facility] NAME: Erika Rios    MR#:  [id number]  DATE OF BIRTH:  [birth year]  DATE OF ADMISSION:  10/28/2018  PRIMARY CARE PHYSICIAN: Tracie Harrier, MD   REQUESTING/REFERRING PHYSICIAN: Delman Kitten, MD  CHIEF COMPLAINT:   Chief Complaint  Patient presents with  . Abdominal Pain    HISTORY OF PRESENT ILLNESS: Erika Rios  is a 58 y.o. female with a known history of chronic systolic congestive heart failure, GERD, hypertension and history of coronary artery disease who recently had ICD placement presenting to the hospital with abdominal pain.  Patient states that the abdominal pain has been going on for a few weeks now.  She was evaluated with a CT scan of the abdomen which showed acute cholecystitis.  The ED MD spoke to Dr. Perrin Maltese of surgery who recommended patient be admitted to the medical decision service for further evaluation and cardiac clearance prior to any surgical intervention.  Patient denies any chest pain or shortness of breath currently.  PAST MEDICAL HISTORY:   Past Medical History:  Diagnosis Date  . CHF (congestive heart failure) ([place])   . GERD (gastroesophageal reflux disease)   . Hypertension   . ST elevation myocardial infarction (STEMI) of anterior wall, initial episode of care ([place]) 2017  . Tobacco abuse    quit smoking 2017    PAST SURGICAL HISTORY:  Past Surgical History:  Procedure Laterality Date  . BREAST BIOPSY Right 12/10/2016   chip remains in breast. benign  . CARDIAC CATHETERIZATION N/A 02/24/2016   Procedure: Left Heart Cath and Coronary Angiography;  Surgeon: Troy Sine, MD;  Location: [facility];  Service: Cardiovascular;  Laterality: N/A;  . CARDIAC CATHETERIZATION N/A 02/24/2016   Procedure: Coronary Stent Intervention;  Surgeon: Troy Sine, MD;  Location: [facility];  Service: Cardiovascular;  Laterality: N/A;  . IMPLANTABLE CARDIOVERTER DEFIBRILLATOR (ICD)  GENERATOR CHANGE Left 10/22/2018   Procedure: ICD GENERATOR INSERTION;  Surgeon: Marzetta Board, MD;  Location: [facility];  Service: Cardiovascular;  Laterality: Left;    SOCIAL HISTORY:  Social History   Tobacco Use  . Smoking status: Former Smoker    Packs/day: 0.50    Years: 34.00    Pack years: 17.00    Types: Cigarettes    Last attempt to quit: 03/19/2016    Years since quitting: 2.6  . Smokeless tobacco: Never Used  Substance Use Topics  . Alcohol use: No    Alcohol/week: 0.0 standard drinks    FAMILY HISTORY:  Family History  Problem Relation Age of Onset  . Cancer Mother   . Heart disease Mother   . Heart attack Mother   . Breast cancer Mother   . Diabetes Father     DRUG ALLERGIES:  Allergies  Allergen Reactions  . Statins Other (See Comments)    Hip pain, constant  . Elastic Bandages & [Zinc] Rash    Paper tape should be okay  . Iron Nausea And Vomiting  . Latex Dermatitis  . Nsaids     Told not to take d/t heart status  . Oxycodone Anxiety    REVIEW OF SYSTEMS:   CONSTITUTIONAL: No fever, fatigue or weakness.  EYES: No blurred or double vision.  EARS, NOSE, AND THROAT: No tinnitus or ear pain.  RESPIRATORY: No cough, shortness of breath, wheezing or hemoptysis.  CARDIOVASCULAR: No chest pain, orthopnea, edema.  GASTROINTESTINAL: No nausea, vomiting, diarrhea or positive abdominal pain  in the right upper quadrant.  GENITOURINARY: No dysuria, hematuria.  ENDOCRINE: No polyuria, nocturia,  HEMATOLOGY: No anemia, easy bruising or bleeding SKIN: No rash or lesion. MUSCULOSKELETAL: No joint pain or arthritis.   NEUROLOGIC: No tingling, numbness, weakness.  PSYCHIATRY: No anxiety or depression.   MEDICATIONS AT HOME:  Prior to Admission medications   Medication Sig Start Date End Date Taking? Authorizing Provider  acetaminophen (TYLENOL) 325 MG tablet Take 1-2 tablets (325-650 mg total) by mouth every 4 (four) hours as needed for mild pain. 10/23/18    Callwood, Karma Greaser D, MD  aspirin EC 325 MG EC tablet Take 1 tablet (325 mg total) by mouth 2 (two) times daily. Patient taking differently: Take 325 mg by mouth daily. Once a day in the morning 04/02/16   Hillary Bow, MD  atorvastatin (LIPITOR) 20 MG tablet Take 20 mg by mouth every other day. Patient has hip pain from this and takes rarely    [provider]  carvedilol (COREG) 3.125 MG tablet Take 0.5 tablets (1.56 mg total) by mouth 2 (two) times daily with a meal. PLEASE CONTACT OFFICE FOR ADDITIONAL REFILLS Patient taking differently: Take 3.125 mg by mouth 2 (two) times daily.  09/22/16   Troy Sine, MD  Cyanocobalamin (B-12) 5000 MCG CAPS Take 5,000 mcg by mouth daily.     [provider]  dicyclomine (BENTYL) 20 MG tablet Take 1 tablet (20 mg total) by mouth 3 (three) times daily as needed for spasms. 10/18/18 10/18/19  Harvest Dark, MD  lisinopril (PRINIVIL,ZESTRIL) 2.5 MG tablet Take 1 tablet (2.5 mg total) by mouth daily. PLEASE CONTACT OFFICE FOR ADDITIONAL REFILLS Patient taking differently: Take 2.5 mg by mouth daily. In the morning 09/22/16   Troy Sine, MD  Magnesium 250 MG TABS Take 250 mg by mouth daily.    [provider]  nitroGLYCERIN (NITROSTAT) 0.4 MG SL tablet PLACE 1 TABLET UNDER TONGUE EVERY 5 MINUTES IF NEEDED FOR CHEST PAIN UP TO 3 DOSES Patient taking differently: Place 0.4 mg under the tongue every 5 (five) minutes as needed for chest pain.  08/26/17   Bhagat, Bhavinkumar, PA  ondansetron (ZOFRAN ODT) 4 MG disintegrating tablet Take 1 tablet (4 mg total) by mouth every 8 (eight) hours as needed for nausea or vomiting. 10/18/18   Harvest Dark, MD  oxyCODONE-acetaminophen (PERCOCET/ROXICET) 5-325 MG tablet Take 1-2 tablets by mouth every 4 (four) hours as needed for severe pain. 10/23/18   Callwood, Dwayne D, MD  pantoprazole (PROTONIX) 40 MG tablet Take 1 tablet (40 mg total) by mouth 2 (two) times daily before a meal. Patient  taking differently: Take 40 mg by mouth daily.  04/02/16   Hillary Bow, MD  Potassium 99 MG TABS Take 198 mg by mouth every evening.    [provider]  spironolactone (ALDACTONE) 25 MG tablet Take 0.5 tablets (12.5 mg total) by mouth daily. Patient taking differently: Take 12.5 mg by mouth at bedtime.  02/28/16   Bhagat, Crista Luria, PA  triamcinolone cream (KENALOG) 0.1 % Apply 1 application topically daily. 06/02/18   [provider]      PHYSICAL EXAMINATION:   VITAL SIGNS: Blood pressure (!) 141/77, pulse 89, temperature 98.1 F (36.7 C), temperature source Oral, resp. rate 16, SpO2 95 %.  GENERAL:  58 y.o.-year-old patient lying in the bed with no acute distress.  EYES: Pupils equal, round, reactive to light and accommodation. No scleral icterus. Extraocular muscles intact.  HEENT: Head atraumatic, normocephalic. Oropharynx and  nasopharynx clear.  NECK:  Supple, no jugular venous distention. No thyroid enlargement, no tenderness.  LUNGS: Normal breath sounds bilaterally, no wheezing, rales,rhonchi or crepitation. No use of accessory muscles of respiration.  CARDIOVASCULAR: S1, S2 normal. No murmurs, rubs, or gallops.  ABDOMEN: Soft, right upper quadrant tenderness, nondistended. Bowel sounds present. No organomegaly or mass.  EXTREMITIES: No pedal edema, cyanosis, or clubbing.  NEUROLOGIC: Cranial nerves II through XII are intact. Muscle strength 5/5 in all extremities. Sensation intact. Gait not checked.  PSYCHIATRIC: The patient is alert and oriented x 3.  SKIN: No obvious rash, lesion, or ulcer.   LABORATORY PANEL:   CBC Recent Labs  Lab 10/28/18 1643  WBC 14.8*  HGB 15.1*  HCT 44.1  PLT 310  MCV 98.2  MCH 33.6  MCHC 34.2  RDW 12.6   ------------------------------------------------------------------------------------------------------------------  Chemistries  Recent Labs  Lab 10/28/18 1643  NA 133*  K 4.6  CL 100  CO2 24  GLUCOSE 148*  BUN  12  CREATININE 0.67  CALCIUM 9.2  AST 95*  ALT 58*  ALKPHOS 107  BILITOT 0.7   ------------------------------------------------------------------------------------------------------------------ estimated creatinine clearance is 83.8 mL/min (by C-G formula based on SCr of 0.67 mg/dL). ------------------------------------------------------------------------------------------------------------------ No results for input(s): TSH, T4TOTAL, T3FREE, THYROIDAB in the last 72 hours.  Invalid input(s): FREET3   Coagulation profile No results for input(s): INR, PROTIME in the last 168 hours. ------------------------------------------------------------------------------------------------------------------- No results for input(s): DDIMER in the last 72 hours. -------------------------------------------------------------------------------------------------------------------  Cardiac Enzymes Recent Labs  Lab 10/28/18 1643  TROPONINI 0.03*   ------------------------------------------------------------------------------------------------------------------ Invalid input(s): POCBNP  ---------------------------------------------------------------------------------------------------------------  Urinalysis    Component Value Date/Time   COLORURINE YELLOW (A) 10/28/2018 1616   APPEARANCEUR CLEAR (A) 10/28/2018 1616   LABSPEC 1.023 10/28/2018 1616   PHURINE 5.0 10/28/2018 1616   GLUCOSEU NEGATIVE 10/28/2018 1616   HGBUR NEGATIVE 10/28/2018 1616   BILIRUBINUR NEGATIVE 10/28/2018 1616   KETONESUR NEGATIVE 10/28/2018 1616   PROTEINUR NEGATIVE 10/28/2018 1616   NITRITE NEGATIVE 10/28/2018 1616   LEUKOCYTESUR NEGATIVE 10/28/2018 1616     RADIOLOGY: US Abdomen Limited Ruq  Result Date: 10/28/2018 CLINICAL DATA:  Right upper quadrant abdominal pain for 1 week. EXAM: ULTRASOUND ABDOMEN LIMITED RIGHT UPPER QUADRANT COMPARISON:  None. FINDINGS: Gallbladder: Moderately distended. 3 cm stone in  the gallbladder neck. Wall is thickened and edematous, 9 mm in thickness. There is a small amount of pericholecystic fluid but no sonographic Murphy's sign. Common bile duct: Diameter: 4 mm. Liver: Increased parenchymal echogenicity. Along the inferior aspect of the liver, right lobe, there is a complex mass or lesion with a cystic central component at mildly hyperechoic peripherally, measuring 3.9 x 3.4 x 3.9 cm. There is a similar-appearing lesion also from the inferior right liver lobe, measuring 3.2 x 2.2 x 2.4 cm. These lesions are not evident on the prior CT. Portal vein is patent on color Doppler imaging with normal direction of blood flow towards the liver. IMPRESSION: 1. Large gallstone in the gallbladder neck associated with gallbladder wall thickening and edema and a small amount of pericholecystic fluid. Despite the lack of a sonographic Murphy sign, acute cholecystitis is suspected. 2. Two complex lesions are noted along the inferior margin of the right liver lobe, both of which have anechoic/cystic central areas. These may reflect abscesses. They are new since the prior abdomen and pelvis CT. 3. Hepatic steatosis. Electronically Signed   By: Lajean Manes M.D.   On: 10/28/2018 19:28    EKG: Orders placed or performed during  the hospital encounter of 10/28/18  . ED EKG  . ED EKG    IMPRESSION AND PLAN: Patient is 58 year old presenting with abdominal pain  1.  Acute cholecystitis we will treat with IV Zosyn for now Surgery will see the patient Cardiology has been consulted for cardiac clearance  2.  Chronic systolic CHF currently stable no evidence of exasperation continue therapy with lisinopril, Coreg and low-dose spironolactone  3.  Coronary artery disease hold aspirin for possible surgery  4.  Hyperlipidemia continue Lipitor  5.  Morbid obesity weight loss recommended    All the records are reviewed and case discussed with ED provider. Management plans discussed with the  patient, family and they are in agreement.  CODE STATUS: Code Status History    Date Active Date Inactive Code Status Order ID Comments User Context   10/22/2018 1529 10/23/2018 1418 Full Code [id number]  Marzetta Board, MD Inpatient   04/01/2016 1446 04/02/2016 1307 Full Code [id number]  Nicholes Mango, MD Inpatient   02/24/2016 1541 02/28/2016 2020 Full Code [id number]  Eileen Stanford, PA-C Inpatient       TOTAL TIME TAKING CARE OF THIS PATIENT: 55 minutes.    Dustin Flock M.D on 10/28/2018 at 8:13 PM  Between 7am to 6pm - Pager - [phone]  After 6pm go to [email or web address] - password [email or web address]  [facility] Office  [phone]  CC: Primary care physician; Tracie Harrier, MD

## 2018-10-29 ENCOUNTER — Encounter: Payer: Self-pay | Admitting: Radiology

## 2018-10-29 ENCOUNTER — Inpatient Hospital Stay: Payer: BC Managed Care – PPO

## 2018-10-29 LAB — BASIC METABOLIC PANEL
Anion gap: 9 (ref 5–15)
BUN: 11 mg/dL (ref 6–20)
CO2: 25 mmol/L (ref 22–32)
Calcium: 9 mg/dL (ref 8.9–10.3)
Chloride: 101 mmol/L (ref 98–111)
Creatinine, Ser: 0.68 mg/dL (ref 0.44–1.00)
GFR calc Af Amer: >60 mL/min (ref >60)
GFR calc non Af Amer: >60 mL/min (ref >60)
GLUCOSE: 144 mg/dL — AB (ref 70–99)
POTASSIUM: 4.4 mmol/L (ref 3.5–5.1)
Sodium: 135 mmol/L (ref 135–145)

## 2018-10-29 LAB — CBC
HCT: 38.7 % (ref 36.0–46.0)
Hemoglobin: 13.4 g/dL (ref 12.0–15.0)
MCH: 33.6 pg (ref 26.0–34.0)
MCHC: 34.6 g/dL (ref 30.0–36.0)
MCV: 97 fL (ref 80.0–100.0)
PLATELETS: 270 10*3/uL (ref 150–400)
RBC: 3.99 MIL/uL (ref 3.87–5.11)
RDW: 12.7 % (ref 11.5–15.5)
WBC: 11.1 10*3/uL — ABNORMAL HIGH (ref 4.0–10.5)
nRBC: 0 % (ref 0.0–0.2)

## 2018-10-29 MED ORDER — SODIUM CHLORIDE 0.9 % IV SOLN
Freq: Once | INTRAVENOUS | Status: AC
  Administered 2018-10-29: via INTRAVENOUS

## 2018-10-29 MED ORDER — CARVEDILOL 3.125 MG PO TABS
3.1250 mg | ORAL_TABLET | Freq: Two times a day (BID) | ORAL | Status: DC
  Administered 2018-10-29 – 2018-11-01 (×6): 3.125 mg via ORAL
  Filled 2018-10-29 (×6): qty 1

## 2018-10-29 MED ORDER — IOPAMIDOL (ISOVUE-300) INJECTION 61%
15.0000 mL | INTRAVENOUS | Status: AC
  Administered 2018-10-29 (×2): 15 mL via ORAL

## 2018-10-29 MED ORDER — IOHEXOL 300 MG/ML  SOLN
100.0000 mL | Freq: Once | INTRAMUSCULAR | Status: AC | PRN
  Administered 2018-10-29: 100 mL via INTRAVENOUS

## 2018-10-29 NOTE — Plan of Care (Signed)
  Problem: Education: Goal: Knowledge of General Education information will improve Description: Including pain rating scale, medication(s)/side effects and non-pharmacologic comfort measures Outcome: Progressing   Problem: Pain Managment: Goal: General experience of comfort will improve Outcome: Progressing   

## 2018-10-29 NOTE — Consult Note (Signed)
[facility]  CARDIOLOGY CONSULT NOTE  Patient ID: Erika Rios MRN: [id number] DOB/AGE: [birth year] 57 y.o.  Admit date: 10/28/2018 Referring Physician [place] Primary Physician [facility] Primary Cardiologist [place] Reason for Consultation Preoperative cardiac evaluation  HPI: 58 year old female referred for preoperative cardiovascular evaluation prior to surgical management of acute cholecystitis.  The patient has a history of coronary artery disease, status post anterior STEMI with DES to proximal LAD in 2017, ischemic cardiomyopathy with LVEF 25%, status post AICD on 10/22/2018, and hypertension.  The patient presented to [facility] ER yesterday for a 2-week history of progressively worsening epigastric pain.  Abdominal ultrasound shows cholecystitis with significant wall thickening and surrounding pericholecystic fluid. Labs notable for WBC 14.8, troponin 0.03. With the degree of possible inflammation, Dr. Hampton Abbot feels there is a high chance the surgery may convert to an open surgery, and will treat conservatively. If she continues to deteriorate, the plan will be to place a percutaneous cholecystostomy tube.    Review of systems complete and found to be negative unless listed above     Past Medical History:  Diagnosis Date  . CHF (congestive heart failure) ([place])   . GERD (gastroesophageal reflux disease)   . Hypertension   . ST elevation myocardial infarction (STEMI) of anterior wall, initial episode of care ([place]) 2017  . Tobacco abuse    quit smoking 2017    Past Surgical History:  Procedure Laterality Date  . BREAST BIOPSY Right 12/10/2016   chip remains in breast. benign  . CARDIAC CATHETERIZATION N/A 02/24/2016   Procedure: Left Heart Cath and Coronary Angiography;  Surgeon: Troy Sine, MD;  Location: [place] CV LAB;  Service: Cardiovascular;  Laterality: N/A;  . CARDIAC CATHETERIZATION N/A 02/24/2016   Procedure: Coronary Stent Intervention;  Surgeon: Troy Sine, MD;   Location: [place] CV LAB;  Service: Cardiovascular;  Laterality: N/A;  . IMPLANTABLE CARDIOVERTER DEFIBRILLATOR (ICD) GENERATOR CHANGE Left 10/22/2018   Procedure: ICD GENERATOR INSERTION;  Surgeon: Marzetta Board, MD;  Location: [facility] ORS;  Service: Cardiovascular;  Laterality: Left;    Medications Prior to Admission  Medication Sig Dispense Refill Last Dose  . acetaminophen (TYLENOL) 325 MG tablet Take 1-2 tablets (325-650 mg total) by mouth every 4 (four) hours as needed for mild pain. 50 tablet 0 Unknown at PRN  . aspirin EC 325 MG EC tablet Take 1 tablet (325 mg total) by mouth 2 (two) times daily. (Patient taking differently: Take 325 mg by mouth daily. Once a day in the morning) 30 tablet 0 10/28/2018 at 0900  . atorvastatin (LIPITOR) 40 MG tablet Take 40 mg by mouth every other day.    48 hours at 48 hours  . carvedilol (COREG) 3.125 MG tablet Take 0.5 tablets (1.56 mg total) by mouth 2 (two) times daily with a meal. PLEASE CONTACT OFFICE FOR ADDITIONAL REFILLS (Patient taking differently: Take 1.56 mg by mouth 2 (two) times daily. ) 30 tablet 0 10/27/2018 at 2000  . Cyanocobalamin (B-12) 5000 MCG CAPS Take 5,000 mcg by mouth daily.    10/27/2018 at 0900  . dicyclomine (BENTYL) 20 MG tablet Take 1 tablet (20 mg total) by mouth 3 (three) times daily as needed for spasms. 30 tablet 0 Past Week at PRN  . lisinopril (PRINIVIL,ZESTRIL) 2.5 MG tablet Take 1 tablet (2.5 mg total) by mouth daily. PLEASE CONTACT OFFICE FOR ADDITIONAL REFILLS (Patient taking differently: Take 2.5 mg by mouth daily. In the morning) 30 tablet 0 10/28/2018 at 0900  . Magnesium  250 MG TABS Take 250 mg by mouth daily.   10/27/2018 at 0900  . nitroGLYCERIN (NITROSTAT) 0.4 MG SL tablet PLACE 1 TABLET UNDER TONGUE EVERY 5 MINUTES IF NEEDED FOR CHEST PAIN UP TO 3 DOSES (Patient taking differently: Place 0.4 mg under the tongue every 5 (five) minutes as needed for chest pain. ) 25 tablet 5 Unknown at PRN  . ondansetron (ZOFRAN  ODT) 4 MG disintegrating tablet Take 1 tablet (4 mg total) by mouth every 8 (eight) hours as needed for nausea or vomiting. 20 tablet 0 Past Week at PRN  . oxyCODONE-acetaminophen (PERCOCET/ROXICET) 5-325 MG tablet Take 1-2 tablets by mouth every 4 (four) hours as needed for severe pain. 30 tablet 0 Past Week at PRN  . pantoprazole (PROTONIX) 40 MG tablet Take 1 tablet (40 mg total) by mouth 2 (two) times daily before a meal. (Patient taking differently: Take 40 mg by mouth daily. ) 60 tablet 0 10/27/2018 at 0900  . Potassium 99 MG TABS Take 198 mg by mouth every evening.   10/27/2018 at 2000  . spironolactone (ALDACTONE) 25 MG tablet Take 0.5 tablets (12.5 mg total) by mouth daily. (Patient taking differently: Take 12.5 mg by mouth at bedtime. ) 30 tablet 6 10/27/2018 at 2000  . triamcinolone cream (KENALOG) 0.1 % Apply 1 application topically daily.  3 Past Week at Unknown time   Social History   Socioeconomic History  . Marital status: Married    Spouse name: greg  . Number of children: Not on file  . Years of education: Not on file  . Highest education level: Not on file  Occupational History  . Occupation: mail lady!!  Social Needs  . Financial resource strain: Not on file  . Food insecurity:    Worry: Not on file    Inability: Not on file  . Transportation needs:    Medical: Not on file    Non-medical: Not on file  Tobacco Use  . Smoking status: Former Smoker    Packs/day: 0.50    Years: 34.00    Pack years: 17.00    Types: Cigarettes    Last attempt to quit: 03/19/2016    Years since quitting: 2.6  . Smokeless tobacco: Never Used  Substance and Sexual Activity  . Alcohol use: No    Alcohol/week: 0.0 standard drinks  . Drug use: No  . Sexual activity: Not on file  Lifestyle  . Physical activity:    Days per week: Not on file    Minutes per session: Not on file  . Stress: Not on file  Relationships  . Social connections:    Talks on phone: Not on file    Gets together:  Not on file    Attends religious service: Not on file    Active member of club or organization: Not on file    Attends meetings of clubs or organizations: Not on file    Relationship status: Not on file  . Intimate partner violence:    Fear of current or ex partner: Not on file    Emotionally abused: Not on file    Physically abused: Not on file    Forced sexual activity: Not on file  Other Topics Concern  . Not on file  Social History Narrative  . Not on file    Family History  Problem Relation Age of Onset  . Cancer Mother   . Heart disease Mother   . Heart attack Mother   . Breast cancer  Mother   . Diabetes Father       Review of systems complete and found to be negative unless listed above      PHYSICAL EXAM  General: Well developed, well nourished, in no acute distress HEENT:  Normocephalic and atramatic Neck:  No JVD.  Lungs: Clear bilaterally to auscultation and percussion. Heart: HRRR . Normal S1 and S2 without gallops or murmurs.  Abdomen: Bowel sounds are positive, abdomen soft and non-tender  Msk:  Back normal, normal gait. Normal strength and tone for age. Extremities: No clubbing, cyanosis or edema.   Neuro: Alert and oriented X 3. Psych:  Good affect, responds appropriately  Labs:   Lab Results  Component Value Date   WBC 11.1 (H) 10/29/2018   HGB 13.4 10/29/2018   HCT 38.7 10/29/2018   MCV 97.0 10/29/2018   PLT 270 10/29/2018    Recent Labs  Lab 10/28/18 1643 10/29/18 0502  NA 133* 135  K 4.6 4.4  CL 100 101  CO2 24 25  BUN 12 11  CREATININE 0.67 0.68  CALCIUM 9.2 9.0  PROT 7.5  --   BILITOT 0.7  --   ALKPHOS 107  --   ALT 58*  --   AST 95*  --   GLUCOSE 148* 144*   Lab Results  Component Value Date   TROPONINI 0.03 (HH) 10/28/2018    Lab Results  Component Value Date   CHOL 113 04/02/2016   CHOL 170 02/25/2016   Lab Results  Component Value Date   HDL 39 (L) 04/02/2016   HDL 50 02/25/2016   Lab Results  Component  Value Date   LDLCALC 57 04/02/2016   LDLCALC 102 (H) 02/25/2016   Lab Results  Component Value Date   TRIG 87 04/02/2016   TRIG 90 02/25/2016   Lab Results  Component Value Date   CHOLHDL 2.9 04/02/2016   CHOLHDL 3.4 02/25/2016   No results found for: LDLDIRECT    Radiology: Dg Chest 2 View  Result Date: 10/23/2018 CLINICAL DATA:  Pacemaker placement. EXAM: CHEST - 2 VIEW COMPARISON:  10/08/2018 FINDINGS: Lateral view degraded by patient arm position. Pacer/AICD device. Leads at right atrium and right ventricle. Midline trachea. Normal heart size and mediastinal contours. No pleural effusion or pneumothorax. No congestive failure. IMPRESSION: Interval placement of dual lead pacer, without pneumothorax or other acute complication. Electronically Signed   By: Abigail Miyamoto M.D.   On: 10/23/2018 07:08   Dg Chest 2 View  Result Date: 10/08/2018 CLINICAL DATA:  58 y/o female pt, preop cxr for defibrillator placement on Feb. 21st. Former smoker, quit 3 yrs ago. Pt denies SOB, CP, and cough.Sx cardiac catheterization 2017. EXAM: CHEST - 2 VIEW COMPARISON:  04/01/2016 FINDINGS: Cardiac silhouette is normal in size and configuration. Subtle coronary artery stent noted on the lateral view. Normal mediastinal and hilar contours. Clear lungs.  No pleural effusion or pneumothorax. Skeletal structures are unremarkable. IMPRESSION: No active cardiopulmonary disease. Electronically Signed   By: Lajean Manes M.D.   On: 10/08/2018 15:36   Ct Abdomen Pelvis W Contrast  Result Date: 10/18/2018 CLINICAL DATA:  Abdominal pain EXAM: CT ABDOMEN AND PELVIS WITH CONTRAST TECHNIQUE: Multidetector CT imaging of the abdomen and pelvis was performed using the standard protocol following bolus administration of intravenous contrast. CONTRAST:  168mL ISOVUE-300 IOPAMIDOL (ISOVUE-300) INJECTION 61% COMPARISON:  CT abdomen pelvis 06/07/2017 FINDINGS: Lower chest: Lung bases well aerated and clear bilaterally Hepatobiliary:  Fatty liver without focal lesion. Multiple gallstones.  Largest stone in the gallbladder neck measures 27 mm similar to the prior study. Additional smaller calcified gallstones in the gallbladder. No gallbladder wall thickening or biliary dilatation. Pancreas: Negative Spleen: Negative Adrenals/Urinary Tract: Adrenal glands are unremarkable. Kidneys are normal, without renal calculi, focal lesion, or hydronephrosis. Bladder is unremarkable. Stomach/Bowel: Mild sigmoid diverticulosis without diverticulitis. Normal appendix. Negative for bowel obstruction Vascular/Lymphatic: Mild atherosclerotic disease in the aorta without aneurysm. Negative for lymphadenopathy Reproductive: Normal uterus.  No pelvic mass Other: Negative for free fluid. Musculoskeletal: Mild degenerative change lumbar spine. No acute skeletal abnormality. IMPRESSION: Cholelithiasis without evidence of cholecystitis. Fatty liver Sigmoid diverticulosis without evidence of diverticulitis. Electronically Signed   By: Franchot Gallo M.D.   On: 10/18/2018 16:22   Dg C-arm 1-60 Min-no Report  Result Date: 10/22/2018 Fluoroscopy was utilized by the requesting physician.  No radiographic interpretation.   US Abdomen Limited Ruq  Result Date: 10/28/2018 CLINICAL DATA:  Right upper quadrant abdominal pain for 1 week. EXAM: ULTRASOUND ABDOMEN LIMITED RIGHT UPPER QUADRANT COMPARISON:  None. FINDINGS: Gallbladder: Moderately distended. 3 cm stone in the gallbladder neck. Wall is thickened and edematous, 9 mm in thickness. There is a small amount of pericholecystic fluid but no sonographic Murphy's sign. Common bile duct: Diameter: 4 mm. Liver: Increased parenchymal echogenicity. Along the inferior aspect of the liver, right lobe, there is a complex mass or lesion with a cystic central component at mildly hyperechoic peripherally, measuring 3.9 x 3.4 x 3.9 cm. There is a similar-appearing lesion also from the inferior right liver lobe, measuring 3.2 x 2.2 x  2.4 cm. These lesions are not evident on the prior CT. Portal vein is patent on color Doppler imaging with normal direction of blood flow towards the liver. IMPRESSION: 1. Large gallstone in the gallbladder neck associated with gallbladder wall thickening and edema and a small amount of pericholecystic fluid. Despite the lack of a sonographic Murphy sign, acute cholecystitis is suspected. 2. Two complex lesions are noted along the inferior margin of the right liver lobe, both of which have anechoic/cystic central areas. These may reflect abscesses. They are new since the prior abdomen and pelvis CT. 3. Hepatic steatosis. Electronically Signed   By: Lajean Manes M.D.   On: 10/28/2018 19:28    EKG: sinus rhythm  ASSESSMENT AND PLAN:  1. Preoperative cardiovascular evaluation prior to surgical management of acute cholecystitis. ECG revealed ST-T wave abnormalities of anterior leads, consistent with known coronary anatomy in the absence of chest pain. 2. Ischemic cardiomyopathy with LVEF 25%, status post AICD on 10/22/2018 3. Coronary artery disease status post anterior STEMI with DES to proximal LAD in 2017, on carvedilol, lisinopril, spironolactone, and atorvastatin.  4. Hypertension, on lisinopril, spirolactone, carvedilol 5. Acute cholecystitis, on IV antibiotics, NPO   Recommendations: 1.  Proceed with minimally invasive surgical approach to manage cholecystitis, preferably with percutaneous tube rather than open surgery.  The patient has been medically optimized and minimally symptomatic from a cardiovascular standpoint.  2.  Continue carvedilol, spironolactone, and lisinopril  Signed: [name] PA-C 10/29/2018, 8:17 AM

## 2018-10-29 NOTE — Plan of Care (Signed)
  Problem: Health Behavior/Discharge Planning: Goal: Ability to manage health-related needs will improve Outcome: Not Progressing Note:  Patient still complaining of 8/10 upper abdominal pain frequently. Medicated w/ Dilaudid and Tylenol already. Will continue to monitor pain/symptom management for remainder of shift. Wenda Low [facility]

## 2018-10-29 NOTE — Progress Notes (Addendum)
Dr. Posey Pronto was notified that pt CT Abdomen Pelvis resulted. Will continue to monitor.  Update 2332: Pt BP was at 98/64 HR 83 and pt complaining of 8 out 10 pain. Talked to NP [facility] and ordered a 0.9% sodium chloride bolus once, give PRN dilaudid and collect u/a. Will continue to monitor.

## 2018-10-29 NOTE — Progress Notes (Signed)
[place] [facility] [place] NAME: Mannie Wineland    MR#:  [id number]  DATE OF BIRTH:  1961/08/27  SUBJECTIVE:   Patient admitted to the hospital secondary to abdominal pain and suspected to have acute cholecystitis.  Patient being treated conservatively due to high risk surgical candidate and currently on IV antibiotics and supportive care.  White cell count is improved since yesterday, abdominal pain improved.  Patient complains of intermittent nausea but no vomiting.  REVIEW OF SYSTEMS:    Review of Systems  Constitutional: Negative for chills and fever.  HENT: Negative for congestion and tinnitus.   Eyes: Negative for blurred vision and double vision.  Respiratory: Negative for cough, shortness of breath and wheezing.   Cardiovascular: Negative for chest pain, orthopnea and PND.  Gastrointestinal: Positive for abdominal pain (RUQ) and nausea. Negative for diarrhea and vomiting.  Genitourinary: Negative for dysuria and hematuria.  Neurological: Negative for dizziness, sensory change and focal weakness.  All other systems reviewed and are negative.   Nutrition: NPO Tolerating Diet: No Tolerating PT: Await Eval.   DRUG ALLERGIES:   Allergies  Allergen Reactions  . Statins Other (See Comments)    Hip pain, constant  . Elastic Bandages & [Zinc] Rash    Paper tape should be okay  . Iron Nausea And Vomiting  . Latex Dermatitis  . Nsaids     Told not to take d/t heart status  . Oxycodone Anxiety    VITALS:  Blood pressure (!) 109/56, pulse 95, temperature 99.1 F (37.3 C), temperature source Oral, resp. rate 18, weight 90.4 kg, SpO2 95 %.  PHYSICAL EXAMINATION:   Physical Exam  GENERAL:  58 y.o.-year-old patient lying in bed in no acute distress.  EYES: Pupils equal, round, reactive to light and accommodation. No scleral icterus. Extraocular muscles intact.  HEENT: Head atraumatic, normocephalic. Oropharynx and nasopharynx clear.    NECK:  Supple, no jugular venous distention. No thyroid enlargement, no tenderness.  LUNGS: Normal breath sounds bilaterally, no wheezing, rales, rhonchi. No use of accessory muscles of respiration.  CARDIOVASCULAR: S1, S2 normal. No murmurs, rubs, or gallops.  ABDOMEN: Soft, Tender in RUQ, no rebound, rigidity, slightly distended. Bowel sounds present. No organomegaly or mass.  EXTREMITIES: No cyanosis, clubbing or edema b/l.    NEUROLOGIC: Cranial nerves II through XII are intact. No focal Motor or sensory deficits b/l.   PSYCHIATRIC: The patient is alert and oriented x 3.  SKIN: No obvious rash, lesion, or ulcer.    LABORATORY PANEL:   CBC Recent Labs  Lab 10/29/18 0502  WBC 11.1*  HGB 13.4  HCT 38.7  PLT 270   ------------------------------------------------------------------------------------------------------------------  Chemistries  Recent Labs  Lab 10/28/18 1643 10/29/18 0502  NA 133* 135  K 4.6 4.4  CL 100 101  CO2 24 25  GLUCOSE 148* 144*  BUN 12 11  CREATININE 0.67 0.68  CALCIUM 9.2 9.0  AST 95*  --   ALT 58*  --   ALKPHOS 107  --   BILITOT 0.7  --    ------------------------------------------------------------------------------------------------------------------  Cardiac Enzymes Recent Labs  Lab 10/28/18 1643  TROPONINI 0.03*   ------------------------------------------------------------------------------------------------------------------  RADIOLOGY:  US Abdomen Limited Ruq  Result Date: 10/28/2018 CLINICAL DATA:  Right upper quadrant abdominal pain for 1 week. EXAM: ULTRASOUND ABDOMEN LIMITED RIGHT UPPER QUADRANT COMPARISON:  None. FINDINGS: Gallbladder: Moderately distended. 3 cm stone in the gallbladder neck. Wall is thickened and edematous, 9 mm in thickness. There  is a small amount of pericholecystic fluid but no sonographic Murphy's sign. Common bile duct: Diameter: 4 mm. Liver: Increased parenchymal echogenicity. Along the inferior aspect  of the liver, right lobe, there is a complex mass or lesion with a cystic central component at mildly hyperechoic peripherally, measuring 3.9 x 3.4 x 3.9 cm. There is a similar-appearing lesion also from the inferior right liver lobe, measuring 3.2 x 2.2 x 2.4 cm. These lesions are not evident on the prior CT. Portal vein is patent on color Doppler imaging with normal direction of blood flow towards the liver. IMPRESSION: 1. Large gallstone in the gallbladder neck associated with gallbladder wall thickening and edema and a small amount of pericholecystic fluid. Despite the lack of a sonographic Murphy sign, acute cholecystitis is suspected. 2. Two complex lesions are noted along the inferior margin of the right liver lobe, both of which have anechoic/cystic central areas. These may reflect abscesses. They are new since the prior abdomen and pelvis CT. 3. Hepatic steatosis. Electronically Signed   By: Lajean Manes M.D.   On: 10/28/2018 19:28     ASSESSMENT AND PLAN:   58 year old female with past medical history of coronary artery disease, ischemic cardiomyopathy ejection fraction of 25% status post AICD, hypertension, chronic systolic CHF who presented to the hospital due to abdominal pain and suspected to have acute cholecystitis.  1.  Acute cholecystitis- this is the cause of patient's abdominal pain.  Patient's CT scan and right upper quadrant ultrasound was suggestive of this. - Seen by general surgery and they think she is a high cardiac surgical risk and therefore being managed conservatively. -Continue IV fluids, pain control, IV Zosyn, follow white cell count and follow clinically.  2. Liver Masses - seen on abdominal ultrasound yesterday. -Discussed with general surgery and they are ordering a CT to take a further look at this.  If these truly are abscesses they may need IR guided drainage.  3.  Ischemic cardiomyopathy with ejection fraction of 25% status post AICD-clinically patient is not  in congestive heart failure. -Continue carvedilol, lisinopril, atorvastatin, Aldactone.  4.  Essential hypertension-continue carvedilol, lisinopril.    All the records are reviewed and case discussed with Care Management/Social Worker. Management plans discussed with the patient, family and they are in agreement.  CODE STATUS: Full code  DVT Prophylaxis: Hep SQ  TOTAL TIME TAKING CARE OF THIS PATIENT: 30 minutes.   POSSIBLE D/C IN 3-4 DAYS, DEPENDING ON CLINICAL CONDITION.   Henreitta Leber M.D on 10/29/2018 at 3:00 PM  Between 7am to 6pm - Pager - [phone]  After 6pm go to [email or web address] - Proofreader  [facility]  Office  [phone]  CC: Primary care physician; Tracie Harrier, MD

## 2018-10-29 NOTE — Consult Note (Signed)
[facility] SURGICAL CONSULTATION NOTE (initial) - cpt: [id number] (Outpatient/ED)   HISTORY OF PRESENT ILLNESS (HPI):  58 y.o. female presented to [facility] ED on 02/27 for evaluation of abdominal pain. Patient reports a 10 days history of abdominal pain. She was seen on 02/17 in the emergency department for upper abdominal pain however work up at that time with CT was unrevealing and she was discharged home.In the interim, she had an ICD placed on 10/22/2018 with Dr Saralyn Pilar. She is currently on full dose ASA. She presents again today with persistent epigastric abdominal pain. She denied any relief in the pain since presentation on the 17th. She endorses associated nausea but denied any fevers, chills, CP, SOB, emesis, or bladder or bowel changes. No history of similar pain prior to the onset of her pain a few weeks ago. No previous abdominal surgeries. Work up in the ED was concerning for acute cholecystitis.    This morning, she reports that her abdominal pain is improved.   Surgery is consulted by emergency medicine physician Dr. Arta Silence, MD in this context for evaluation and management of acute cholecystitis.   PAST MEDICAL HISTORY (PMH):  Past Medical History:  Diagnosis Date  . CHF (congestive heart failure) ([place])   . GERD (gastroesophageal reflux disease)   . Hypertension   . ST elevation myocardial infarction (STEMI) of anterior wall, initial episode of care ([place]) 2017  . Tobacco abuse    quit smoking 2017     PAST SURGICAL HISTORY [facility]):  Past Surgical History:  Procedure Laterality Date  . BREAST BIOPSY Right 12/10/2016   chip remains in breast. benign  . CARDIAC CATHETERIZATION N/A 02/24/2016   Procedure: Left Heart Cath and Coronary Angiography;  Surgeon: Troy Sine, MD;  Location: [place] [facility];  Service: Cardiovascular;  Laterality: N/A;  . CARDIAC CATHETERIZATION N/A 02/24/2016   Procedure: Coronary Stent Intervention;  Surgeon: Troy Sine, MD;  Location: [place] [facility];  Service: Cardiovascular;  Laterality: N/A;  . IMPLANTABLE CARDIOVERTER DEFIBRILLATOR (ICD) GENERATOR CHANGE Left 10/22/2018   Procedure: ICD GENERATOR INSERTION;  Surgeon: Marzetta Board, MD;  Location: [facility] ORS;  Service: Cardiovascular;  Laterality: Left;     MEDICATIONS:  Prior to Admission medications   Medication Sig Start Date End Date Taking? Authorizing Provider  acetaminophen (TYLENOL) 325 MG tablet Take 1-2 tablets (325-650 mg total) by mouth every 4 (four) hours as needed for mild pain. 10/23/18  Yes Callwood, Dwayne D, MD  aspirin EC 325 MG EC tablet Take 1 tablet (325 mg total) by mouth 2 (two) times daily. Patient taking differently: Take 325 mg by mouth daily. Once a day in the morning 04/02/16  Yes Sudini, Srikar, MD  atorvastatin (LIPITOR) 40 MG tablet Take 40 mg by mouth every other day.    Yes [provider]  carvedilol (COREG) 3.125 MG tablet Take 0.5 tablets (1.56 mg total) by mouth 2 (two) times daily with a meal. PLEASE CONTACT OFFICE FOR ADDITIONAL REFILLS Patient taking differently: Take 1.56 mg by mouth 2 (two) times daily.  09/22/16  Yes Troy Sine, MD  Cyanocobalamin (B-12) 5000 MCG CAPS Take 5,000 mcg by mouth daily.    Yes [provider]  dicyclomine (BENTYL) 20 MG tablet Take 1 tablet (20 mg total) by mouth 3 (three) times daily as needed for spasms. 10/18/18 10/18/19 Yes Harvest Dark, MD  lisinopril (PRINIVIL,ZESTRIL) 2.5 MG tablet Take 1 tablet (2.5 mg total) by mouth daily. PLEASE CONTACT OFFICE  FOR ADDITIONAL REFILLS Patient taking differently: Take 2.5 mg by mouth daily. In the morning 09/22/16  Yes Troy Sine, MD  Magnesium 250 MG TABS Take 250 mg by mouth daily.   Yes [provider]  nitroGLYCERIN (NITROSTAT) 0.4 MG SL tablet PLACE 1 TABLET UNDER TONGUE EVERY 5 MINUTES IF NEEDED FOR CHEST PAIN UP TO 3 DOSES Patient taking differently: Place 0.4 mg under the tongue every 5 (five)  minutes as needed for chest pain.  08/26/17  Yes Bhagat, Bhavinkumar, PA  ondansetron (ZOFRAN ODT) 4 MG disintegrating tablet Take 1 tablet (4 mg total) by mouth every 8 (eight) hours as needed for nausea or vomiting. 10/18/18  Yes Harvest Dark, MD  oxyCODONE-acetaminophen (PERCOCET/ROXICET) 5-325 MG tablet Take 1-2 tablets by mouth every 4 (four) hours as needed for severe pain. 10/23/18  Yes Callwood, Dwayne D, MD  pantoprazole (PROTONIX) 40 MG tablet Take 1 tablet (40 mg total) by mouth 2 (two) times daily before a meal. Patient taking differently: Take 40 mg by mouth daily.  04/02/16  Yes Hillary Bow, MD  Potassium 99 MG TABS Take 198 mg by mouth every evening.   Yes [provider]  spironolactone (ALDACTONE) 25 MG tablet Take 0.5 tablets (12.5 mg total) by mouth daily. Patient taking differently: Take 12.5 mg by mouth at bedtime.  02/28/16  Yes Bhagat, Bhavinkumar, PA  triamcinolone cream (KENALOG) 0.1 % Apply 1 application topically daily. 06/02/18  Yes [provider]     ALLERGIES:  Allergies  Allergen Reactions  . Statins Other (See Comments)    Hip pain, constant  . Elastic Bandages & [Zinc] Rash    Paper tape should be okay  . Iron Nausea And Vomiting  . Latex Dermatitis  . Nsaids     Told not to take d/t heart status  . Oxycodone Anxiety     SOCIAL HISTORY:  Social History   Socioeconomic History  . Marital status: Married    Spouse name: greg  . Number of children: Not on file  . Years of education: Not on file  . Highest education level: Not on file  Occupational History  . Occupation: mail lady!!  Social Needs  . Financial resource strain: Not on file  . Food insecurity:    Worry: Not on file    Inability: Not on file  . Transportation needs:    Medical: Not on file    Non-medical: Not on file  Tobacco Use  . Smoking status: Former Smoker    Packs/day: 0.50    Years: 34.00    Pack years: 17.00    Types: Cigarettes    Last attempt  to quit: 03/19/2016    Years since quitting: 2.6  . Smokeless tobacco: Never Used  Substance and Sexual Activity  . Alcohol use: No    Alcohol/week: 0.0 standard drinks  . Drug use: No  . Sexual activity: Not on file  Lifestyle  . Physical activity:    Days per week: Not on file    Minutes per session: Not on file  . Stress: Not on file  Relationships  . Social connections:    Talks on phone: Not on file    Gets together: Not on file    Attends religious service: Not on file    Active member of club or organization: Not on file    Attends meetings of clubs or organizations: Not on file    Relationship status: Not on file  . Intimate partner violence:  Fear of current or ex partner: Not on file    Emotionally abused: Not on file    Physically abused: Not on file    Forced sexual activity: Not on file  Other Topics Concern  . Not on file  Social History Narrative  . Not on file     FAMILY HISTORY:  Family History  Problem Relation Age of Onset  . Cancer Mother   . Heart disease Mother   . Heart attack Mother   . Breast cancer Mother   . Diabetes Father       REVIEW OF SYSTEMS:  Review of Systems  Constitutional: Negative for chills and fever.  Respiratory: Negative for cough and shortness of breath.   Cardiovascular: Negative for chest pain and palpitations.  Gastrointestinal: Positive for abdominal pain and nausea. Negative for blood in stool, constipation, diarrhea and vomiting.  Genitourinary: Negative for dysuria and urgency.  Neurological: Negative for dizziness and headaches.  All other systems reviewed and are negative.   VITAL SIGNS:  Temp:  [98.1 F (36.7 C)-99.2 F (37.3 C)] 99.1 F (37.3 C) (02/28 0738) Pulse Rate:  [87-104] 95 (02/28 0738) Resp:  [16-18] 18 (02/28 0738) BP: (109-156)/(56-90) 109/56 (02/28 0738) SpO2:  [89 %-98 %] 95 % (02/28 0521) Weight:  [90.4 kg] 90.4 kg (02/27 2242)       Weight: 90.4 kg BMI (Calculated): 35.31    INTAKE/OUTPUT:  This shift: No intake/output data recorded.  Last 2 shifts: @IOLAST2SHIFTS @   PHYSICAL EXAM:  Physical Exam Vitals signs and nursing note reviewed.  Constitutional:      General: She is not in acute distress.    Appearance: She is well-developed. She is obese. She is not ill-appearing.  HENT:     Head: Normocephalic and atraumatic.  Eyes:     General: No scleral icterus.    Extraocular Movements: Extraocular movements intact.  Cardiovascular:     Rate and Rhythm: Normal rate and regular rhythm.     Heart sounds: No murmur.  Pulmonary:     Effort: Pulmonary effort is normal. No respiratory distress.     Breath sounds: No wheezing or rhonchi.  Abdominal:     General: Abdomen is protuberant. There is no distension.     Tenderness: There is abdominal tenderness in the right upper quadrant. There is no guarding or rebound. Negative signs include Murphy's sign.  Genitourinary:    Comments: Deferred Skin:    General: Skin is warm and dry.     Coloration: Skin is not jaundiced or pale.  Neurological:     General: No focal deficit present.     Mental Status: She is alert and oriented to person, place, and time.  Psychiatric:        Mood and Affect: Mood normal.        Behavior: Behavior normal.       Labs:  CBC Latest Ref Rng & Units 10/29/2018 10/28/2018 10/18/2018  WBC 4.0 - 10.5 K/uL 11.1(H) 14.8(H) 11.7(H)  Hemoglobin 12.0 - 15.0 g/dL 13.4 15.1(H) 14.9  Hematocrit 36.0 - 46.0 % 38.7 44.1 43.9  Platelets 150 - 400 K/uL 270 310 388   CMP Latest Ref Rng & Units 10/29/2018 10/28/2018 10/18/2018  Glucose 70 - 99 mg/dL 144(H) 148(H) 117(H)  BUN 6 - 20 mg/dL 11 12 7   Creatinine 0.44 - 1.00 mg/dL 0.68 0.67 0.81  Sodium 135 - 145 mmol/L 135 133(L) 136  Potassium 3.5 - 5.1 mmol/L 4.4 4.6 4.1  Chloride 98 -  111 mmol/L 101 100 106  CO2 22 - 32 mmol/L 25 24 23   Calcium 8.9 - 10.3 mg/dL 9.0 9.2 9.1  Total Protein 6.5 - 8.1 g/dL - 7.5 7.2  Total Bilirubin 0.3 - 1.2  mg/dL - 0.7 0.6  Alkaline Phos 38 - 126 U/L - 107 74  AST 15 - 41 U/L - 95(H) 35  ALT 0 - 44 U/L - 58(H) 37     Imaging studies:   RUQ [place] (10/28/2018) personally reviewed and radiologist report reviewed:  IMPRESSION: 1. Large gallstone in the gallbladder neck associated with gallbladder wall thickening and edema and a small amount of pericholecystic fluid. Despite the lack of a sonographic Murphy sign, acute cholecystitis is suspected. 2. Two complex lesions are noted along the inferior margin of the right liver lobe, both of which have anechoic/cystic central areas. These may reflect abscesses. They are new since the prior abdomen and pelvis CT. 3. Hepatic steatosis.   Assessment/Plan: (ICD-10's: K81.0) 58 y.o. female with improved leukocytosis and acute cholecystitis, complicated by pertinent comorbidities including CHF following STEMI with stent placement and echocardiogram from 08/2018 with an EF of 25% s/p ICD implantation on 02/21 on full dose ASA, HTN, obesity, and former tobacco abuse (smoking).   - NPO, IVF, IV ABX (Zosyn)   - Given she has had pain for >10 days there is likely significant swelling, in combination with her heart history, will attempt to manage conservatively. If she were to deteriorate this hospitalization would recommend placing percutaneous cholecystostomy tube for decompression. Her and her husband were understanding of this.     - Monitor abdominal examination, bowel function, leukocytosis  - Appreciate cardiology input  - Medical management per primary team   All of the above findings and recommendations were discussed with the patient and her family, and all of patient's and her family's questions were answered to their expressed satisfaction.  Thank you for the opportunity to participate in this patient's care.   -- Edison Simon, PA-C [facility] 10/29/2018, 9:05 AM [phone] M-F: 7am - 4pm

## 2018-10-29 NOTE — Progress Notes (Addendum)
Pharmacy Antibiotic Note  Erika Rios is a 58 y.o. female admitted on 10/28/2018 with cholecystitis. Pharmacy has been consulted for Zosyn dosing. Leukocytosis has improved since admission, she remains afebrile and her renal function is stable. Surgery is attempting to manage conservatively for now  Plan: Continue Zosyn 3.375 gm IV Q8H EI    Weight: 199 lb 4.8 oz (90.4 kg)  Temp (24hrs), Avg:98.8 F (37.1 C), Min:98.1 F (36.7 C), Max:99.2 F (37.3 C)  Recent Labs  Lab 10/28/18 1643 10/29/18 0502  WBC 14.8* 11.1*  CREATININE 0.67 0.68    Estimated Creatinine Clearance: 83.8 mL/min (by C-G formula based on SCr of 0.68 mg/dL).    Antimicrobials this admission:   Zosyn 2/27 >>   Microbiology results: 2/7  MRSA PCR: negative  Thank you for allowing pharmacy to be a part of this patient's care.  Dallie Piles, PharmD 10/29/2018 1:03 PM

## 2018-10-30 ENCOUNTER — Inpatient Hospital Stay: Payer: BC Managed Care – PPO

## 2018-10-30 DIAGNOSIS — K810 Acute cholecystitis: Secondary | ICD-10-CM

## 2018-10-30 LAB — COMPREHENSIVE METABOLIC PANEL
ALBUMIN: 2.9 g/dL — AB (ref 3.5–5.0)
ALT: 108 U/L — ABNORMAL HIGH (ref 0–44)
AST: 76 U/L — ABNORMAL HIGH (ref 15–41)
Alkaline Phosphatase: 118 U/L (ref 38–126)
Anion gap: 9 (ref 5–15)
BUN: 13 mg/dL (ref 6–20)
CO2: 24 mmol/L (ref 22–32)
CREATININE: 0.78 mg/dL (ref 0.44–1.00)
Calcium: 8.4 mg/dL — ABNORMAL LOW (ref 8.9–10.3)
Chloride: 98 mmol/L (ref 98–111)
GFR calc Af Amer: >60 mL/min (ref >60)
GFR calc non Af Amer: >60 mL/min (ref >60)
GLUCOSE: 111 mg/dL — AB (ref 70–99)
Potassium: 4 mmol/L (ref 3.5–5.1)
Sodium: 131 mmol/L — ABNORMAL LOW (ref 135–145)
Total Bilirubin: 0.8 mg/dL (ref 0.3–1.2)
Total Protein: 6.4 g/dL — ABNORMAL LOW (ref 6.5–8.1)

## 2018-10-30 LAB — URINALYSIS, COMPLETE (UACMP) WITH MICROSCOPIC
Bacteria, UA: NONE SEEN
Bilirubin Urine: NEGATIVE
Glucose, UA: NEGATIVE mg/dL
Hgb urine dipstick: NEGATIVE
Ketones, ur: 5 mg/dL — AB
Leukocytes,Ua: NEGATIVE
Nitrite: NEGATIVE
Protein, ur: NEGATIVE mg/dL
SPECIFIC GRAVITY, URINE: 1.039 — AB (ref 1.005–1.030)
pH: 6 (ref 5.0–8.0)

## 2018-10-30 LAB — CBC
HCT: 32.7 % — ABNORMAL LOW (ref 36.0–46.0)
HEMOGLOBIN: 11.2 g/dL — AB (ref 12.0–15.0)
MCH: 33.8 pg (ref 26.0–34.0)
MCHC: 34.3 g/dL (ref 30.0–36.0)
MCV: 98.8 fL (ref 80.0–100.0)
Platelets: 214 10*3/uL (ref 150–400)
RBC: 3.31 MIL/uL — ABNORMAL LOW (ref 3.87–5.11)
RDW: 12.9 % (ref 11.5–15.5)
WBC: 11.1 10*3/uL — ABNORMAL HIGH (ref 4.0–10.5)
nRBC: 0 % (ref 0.0–0.2)

## 2018-10-30 MED ORDER — MIDAZOLAM HCL 2 MG/2ML IJ SOLN
INTRAMUSCULAR | Status: AC | PRN
  Administered 2018-10-30 (×2): 1 mg via INTRAVENOUS

## 2018-10-30 MED ORDER — FENTANYL CITRATE (PF) 100 MCG/2ML IJ SOLN
INTRAMUSCULAR | Status: AC
  Filled 2018-10-30: qty 4

## 2018-10-30 MED ORDER — HEPARIN SODIUM (PORCINE) 5000 UNIT/ML IJ SOLN
5000.0000 [IU] | Freq: Three times a day (TID) | INTRAMUSCULAR | Status: DC
  Administered 2018-10-30 – 2018-11-01 (×6): 5000 [IU] via SUBCUTANEOUS
  Filled 2018-10-30 (×6): qty 1

## 2018-10-30 MED ORDER — MIDAZOLAM HCL 5 MG/5ML IJ SOLN
INTRAMUSCULAR | Status: AC
  Filled 2018-10-30: qty 10

## 2018-10-30 MED ORDER — SODIUM CHLORIDE 0.9 % IV SOLN
INTRAVENOUS | Status: AC | PRN
  Administered 2018-10-30: 10 mL/h via INTRAVENOUS

## 2018-10-30 MED ORDER — FENTANYL CITRATE (PF) 100 MCG/2ML IJ SOLN
INTRAMUSCULAR | Status: AC | PRN
  Administered 2018-10-30 (×3): 25 ug via INTRAVENOUS

## 2018-10-30 MED ORDER — SODIUM CHLORIDE 0.9% FLUSH
5.0000 mL | Freq: Three times a day (TID) | INTRAVENOUS | Status: DC
  Administered 2018-10-30 – 2018-11-01 (×5): 5 mL

## 2018-10-30 MED ORDER — MIDAZOLAM HCL 5 MG/5ML IJ SOLN
INTRAMUSCULAR | Status: AC | PRN
  Administered 2018-10-30: 2 mg via INTRAVENOUS

## 2018-10-30 NOTE — Progress Notes (Signed)
CC: Cholecystitis Subjective: Feels a little bit better.  Some upper abdominal discomfort.  No nausea no vomiting.  Vital signs stable.  Please note that I have personally discussed the case with Dr. Bartholome Bill from interventional radiology and have arrange for a percutaneous cholecystostomy tube.  Please also note that I have personally reviewed old imaging studies including her recent CT scan showing evidence of cholecystitis with intrahepatic abscess seems to be communicating with gallbladder.  Objective: Vital signs in last 24 hours: Temp:  [98.3 F (36.8 C)-99 F (37.2 C)] 98.5 F (36.9 C) (02/29 1025) Pulse Rate:  [66-89] 73 (02/29 1127) Resp:  [13-18] 18 (02/29 1127) BP: (86-165)/(49-91) 114/62 (02/29 1127) SpO2:  [94 %-100 %] 96 % (02/29 1127) Weight:  [74.8 kg] 74.8 kg (02/29 0309) Last BM Date: 10/29/18  Intake/Output from previous day: 02/28 0701 - 02/29 0700 In: -  Out: 600 [Urine:600] Intake/Output this shift: No intake/output data recorded.  Physical exam:  NAD, alert Abd: soft, chole tube in place, cloudy fluid. No peritonitis Ext: well perfused and warm Neuro: GCS 15, no motor or sens deficits  Lab Results: CBC  Recent Labs    10/29/18 0502 10/30/18 0615  WBC 11.1* 11.1*  HGB 13.4 11.2*  HCT 38.7 32.7*  PLT 270 214   BMET Recent Labs    10/29/18 0502 10/30/18 0615  NA 135 131*  K 4.4 4.0  CL 101 98  CO2 25 24  GLUCOSE 144* 111*  BUN 11 13  CREATININE 0.68 0.78  CALCIUM 9.0 8.4*   PT/INR No results for input(s): LABPROT, INR in the last 72 hours. ABG No results for input(s): PHART, HCO3 in the last 72 hours.  Invalid input(s): PCO2, PO2  Studies/Results: Ct Abdomen Pelvis W Contrast  Addendum Date: 10/29/2018   ADDENDUM REPORT: 10/29/2018 18:57 ADDENDUM: These results will be called to the ordering clinician or representative by the Radiologist Assistant, and communication documented in the PACS or zVision Dashboard. Electronically Signed    By: Markus Daft M.D.   On: 10/29/2018 18:57   Result Date: 10/29/2018 CLINICAL DATA:  58 year old with possible liver abscesses and suspected acute cholecystitis. EXAM: CT ABDOMEN AND PELVIS WITH CONTRAST TECHNIQUE: Multidetector CT imaging of the abdomen and pelvis was performed using the standard protocol following bolus administration of intravenous contrast. CONTRAST:  173mL OMNIPAQUE IOHEXOL 300 MG/ML  SOLN COMPARISON:  CT from 10/18/2018 and CT from 06/07/2017 FINDINGS: Lower chest: Calcification at the base of the right middle lobe. No pleural effusions. Cardiac ICD leads. Hepatobiliary: There are several new lesions scattered throughout the liver. These lesions are predominantly in the right hepatic lobe centered around the gallbladder region. However, the lesions do extend into the medial segment of the left hepatic lobe. These lesions are hypodense compared to the adjacent parenchyma but Hounsfield units measures roughly 50. The lesions appear to be contiguous with one another. Maximum dimension of these hepatic lesions measures up to 11 cm. The adjacent gallbladder has wall thickening with stones. There is no fat plane between the gallbladder fundus and these hepatic lesions on sequence 2, image 33. There is likely a connection between the gallbladder fundus and the liver at this location. Large stone at the base of the gallbladder measures up to 2.8 cm. Pericholecystic edema. Again noted is a small hypervascular in the right hepatic lobe that has been present on previous examinations and it is nonspecific but could represent a flash filling hemangioma. Portal venous system is patent. No biliary dilatation. Pancreas:  Unremarkable. No pancreatic ductal dilatation or surrounding inflammatory changes. Spleen: Normal in size without focal abnormality. Adrenals/Urinary Tract: Normal adrenal glands. Normal appearance of the urinary bladder. Normal appearance of both kidneys without stones or hydronephrosis. No  suspicious renal lesions. Stomach/Bowel: Normal appearance of stomach and small bowel. Diverticula in the sigmoid colon without acute inflammation. Normal appendix. Vascular/Lymphatic: Small lymph nodes in the porta hepatis may be reactive. Atherosclerotic calcifications in the abdominal aorta without aneurysm. Celiac trunk and SMA are patent. Reproductive: Uterus and bilateral adnexa are unremarkable. Other: No pelvic free fluid. Small amount of stranding around the gallbladder. Negative for free air. Subcutaneous gas in the right anterior lower abdomen compatible with an injection site. Musculoskeletal: No acute bone abnormality. IMPRESSION: 1. Cholelithiasis with gallbladder inflammation. Findings are compatible with acute cholecystitis. 2. Recent development of complex collections within the liver that are suggestive for intrahepatic abscesses. These collections are contiguous with the gallbladder fundus and raise concern for intrahepatic gallbladder perforation. Hepatic lesions are slightly hyperdense and there may be blood products within these collections. 3. Small hypervascular structure in the right hepatic lobe measures roughly 0.9 cm. Similar structure was present in 2018 and this is probably an incidental finding. Electronically Signed: By: Markus Daft M.D. On: 10/29/2018 18:45   Ct Perc Cholecystostomy  Result Date: 10/30/2018 INDICATION: Acute cholecystitis EXAM: CT PERCUTANEOUS CHOLECYSTOSTOMY MEDICATIONS: None ANESTHESIA/SEDATION: Fentanyl  mcg IV; Versed  mg IV Moderate Sedation Time: The patient was continuously monitored during the procedure by the interventional radiology nurse under my direct supervision. FLUOROSCOPY TIME:  None COMPLICATIONS: None immediate. PROCEDURE: Informed written consent was obtained from the patient after a thorough discussion of the procedural risks, benefits and alternatives. All questions were addressed. Maximal Sterile Barrier Technique was utilized including  caps, mask, sterile gowns, sterile gloves, sterile drape, hand hygiene and skin antiseptic. A timeout was performed prior to the initiation of the procedure. The right upper quadrant was prepped with ChloraPrep in a sterile fashion, and a sterile drape was applied covering the operative field. A sterile gown and sterile gloves were used for the procedure. Under CT guidance, an 18 gauge needle was advanced into the gallbladder via transhepatic approach. It was removed over an Amplatz wire. Ten [place] dilator followed by a 10 [place] drain were inserted. It was looped and string fixed then sewn to the skin. Cloudy bilious fluid was aspirated. FINDINGS: Images document placement of a 10 French drain into the lumen of the gallbladder. IMPRESSION: Successful cholecystostomy. This needs to remain in place at least 4 weeks. Electronically Signed   By: Marybelle Killings M.D.   On: 10/30/2018 12:09   US Abdomen Limited Ruq  Result Date: 10/28/2018 CLINICAL DATA:  Right upper quadrant abdominal pain for 1 week. EXAM: ULTRASOUND ABDOMEN LIMITED RIGHT UPPER QUADRANT COMPARISON:  None. FINDINGS: Gallbladder: Moderately distended. 3 cm stone in the gallbladder neck. Wall is thickened and edematous, 9 mm in thickness. There is a small amount of pericholecystic fluid but no sonographic Murphy's sign. Common bile duct: Diameter: 4 mm. Liver: Increased parenchymal echogenicity. Along the inferior aspect of the liver, right lobe, there is a complex mass or lesion with a cystic central component at mildly hyperechoic peripherally, measuring 3.9 x 3.4 x 3.9 cm. There is a similar-appearing lesion also from the inferior right liver lobe, measuring 3.2 x 2.2 x 2.4 cm. These lesions are not evident on the prior CT. Portal vein is patent on color Doppler imaging with normal direction of blood flow towards  the liver. IMPRESSION: 1. Large gallstone in the gallbladder neck associated with gallbladder wall thickening and edema and a small amount  of pericholecystic fluid. Despite the lack of a sonographic Murphy sign, acute cholecystitis is suspected. 2. Two complex lesions are noted along the inferior margin of the right liver lobe, both of which have anechoic/cystic central areas. These may reflect abscesses. They are new since the prior abdomen and pelvis CT. 3. Hepatic steatosis. Electronically Signed   By: Lajean Manes M.D.   On: 10/28/2018 19:28    Anti-infectives: Anti-infectives (From admission, onward)   Start     Dose/Rate Route Frequency Ordered Stop   10/29/18 0200  piperacillin-tazobactam (ZOSYN) IVPB 3.375 g     3.375 g 12.5 mL/hr over 240 Minutes Intravenous Every 8 hours 10/28/18 2101     10/28/18 2000  piperacillin-tazobactam (ZOSYN) IVPB 3.375 g     3.375 g 100 mL/hr over 30 Minutes Intravenous  Once 10/28/18 1951 10/28/18 2048      Assessment/Plan:  Acute cholecystitis on a patient with significant comorbidities to include heart failure with ejection fraction of 25% and a recent stent and AICD placement. Meant continuation of antibiotics and cholecystostomy tube. No need for emergent surgical intervention. We will continue to follow her Note that I spent 35 minutes with greater than 50% spent in counseling and coordination of her care  Erika Hamman, MD, [facility]  10/30/2018

## 2018-10-30 NOTE — Procedures (Signed)
10 Fr cholecystostomy drain Cloudy bilious fluid EBL 0 Comp 0

## 2018-10-30 NOTE — Progress Notes (Signed)
[facility] NAME: Erika Rios    MR#:  [id number]  DATE OF BIRTH:  May 11, 1961  SUBJECTIVE:   Feeling better today.  States that she is still having some epigastric abdominal pain.  No nausea, vomiting, fevers, chills.  REVIEW OF SYSTEMS:    Review of Systems  Constitutional: Negative for chills and fever.  HENT: Negative for congestion and tinnitus.   Eyes: Negative for blurred vision and double vision.  Respiratory: Negative for cough, shortness of breath and wheezing.   Cardiovascular: Negative for chest pain, orthopnea and PND.  Gastrointestinal: Positive for abdominal pain (RUQ). Negative for diarrhea, nausea and vomiting.  Genitourinary: Negative for dysuria and hematuria.  Neurological: Negative for dizziness, sensory change and focal weakness.  All other systems reviewed and are negative.   Nutrition: NPO Tolerating Diet: No  DRUG ALLERGIES:   Allergies  Allergen Reactions  . Statins Other (See Comments)    Hip pain, constant  . Elastic Bandages & [Zinc] Rash    Paper tape should be okay  . Iron Nausea And Vomiting  . Latex Dermatitis  . Nsaids     Told not to take d/t heart status  . Oxycodone Anxiety    VITALS:  Blood pressure 114/62, pulse 73, temperature 98.5 F (36.9 C), temperature source Oral, resp. rate 18, weight 74.8 kg, SpO2 96 %.  PHYSICAL EXAMINATION:   Physical Exam  GENERAL:  58 y.o.-year-old patient lying in bed in no acute distress.  EYES: Pupils equal, round, reactive to light and accommodation. No scleral icterus. Extraocular muscles intact.  HEENT: Head atraumatic, normocephalic. Oropharynx and nasopharynx clear.  NECK:  Supple, no jugular venous distention. No thyroid enlargement, no tenderness.  LUNGS: Normal breath sounds bilaterally, no wheezing, rales, rhonchi. No use of accessory muscles of respiration.  CARDIOVASCULAR: RRR, S1, S2 normal. No murmurs, rubs, or gallops.    ABDOMEN: Soft, +Tender in RUQ, no rebound, rigidity, slightly distended. Bowel sounds present. No organomegaly or mass.  EXTREMITIES: No cyanosis, clubbing or edema b/l.    NEUROLOGIC: Cranial nerves II through XII are intact. No focal Motor or sensory deficits b/l.   PSYCHIATRIC: The patient is alert and oriented x 3.  SKIN: No obvious rash, lesion, or ulcer.    LABORATORY PANEL:   CBC Recent Labs  Lab 10/30/18 0615  WBC 11.1*  HGB 11.2*  HCT 32.7*  PLT 214   ------------------------------------------------------------------------------------------------------------------  Chemistries  Recent Labs  Lab 10/30/18 0615  NA 131*  K 4.0  CL 98  CO2 24  GLUCOSE 111*  BUN 13  CREATININE 0.78  CALCIUM 8.4*  AST 76*  ALT 108*  ALKPHOS 118  BILITOT 0.8   ------------------------------------------------------------------------------------------------------------------  Cardiac Enzymes Recent Labs  Lab 10/28/18 1643  TROPONINI 0.03*   ------------------------------------------------------------------------------------------------------------------  RADIOLOGY:  Ct Abdomen Pelvis W Contrast  Addendum Date: 10/29/2018   ADDENDUM REPORT: 10/29/2018 18:57 ADDENDUM: These results will be called to the ordering clinician or representative by the Radiologist Assistant, and communication documented in the PACS or zVision Dashboard. Electronically Signed   By: Markus Daft M.D.   On: 10/29/2018 18:57   Result Date: 10/29/2018 CLINICAL DATA:  58 year old with possible liver abscesses and suspected acute cholecystitis. EXAM: CT ABDOMEN AND PELVIS WITH CONTRAST TECHNIQUE: Multidetector CT imaging of the abdomen and pelvis was performed using the standard protocol following bolus administration of intravenous contrast. CONTRAST:  141mL OMNIPAQUE IOHEXOL 300 MG/ML  SOLN COMPARISON:  CT from  10/18/2018 and CT from 06/07/2017 FINDINGS: Lower chest: Calcification at the base of the right  middle lobe. No pleural effusions. Cardiac ICD leads. Hepatobiliary: There are several new lesions scattered throughout the liver. These lesions are predominantly in the right hepatic lobe centered around the gallbladder region. However, the lesions do extend into the medial segment of the left hepatic lobe. These lesions are hypodense compared to the adjacent parenchyma but Hounsfield units measures roughly 50. The lesions appear to be contiguous with one another. Maximum dimension of these hepatic lesions measures up to 11 cm. The adjacent gallbladder has wall thickening with stones. There is no fat plane between the gallbladder fundus and these hepatic lesions on sequence 2, image 33. There is likely a connection between the gallbladder fundus and the liver at this location. Large stone at the base of the gallbladder measures up to 2.8 cm. Pericholecystic edema. Again noted is a small hypervascular in the right hepatic lobe that has been present on previous examinations and it is nonspecific but could represent a flash filling hemangioma. Portal venous system is patent. No biliary dilatation. Pancreas: Unremarkable. No pancreatic ductal dilatation or surrounding inflammatory changes. Spleen: Normal in size without focal abnormality. Adrenals/Urinary Tract: Normal adrenal glands. Normal appearance of the urinary bladder. Normal appearance of both kidneys without stones or hydronephrosis. No suspicious renal lesions. Stomach/Bowel: Normal appearance of stomach and small bowel. Diverticula in the sigmoid colon without acute inflammation. Normal appendix. Vascular/Lymphatic: Small lymph nodes in the porta hepatis may be reactive. Atherosclerotic calcifications in the abdominal aorta without aneurysm. Celiac trunk and SMA are patent. Reproductive: Uterus and bilateral adnexa are unremarkable. Other: No pelvic free fluid. Small amount of stranding around the gallbladder. Negative for free air. Subcutaneous gas in the  right anterior lower abdomen compatible with an injection site. Musculoskeletal: No acute bone abnormality. IMPRESSION: 1. Cholelithiasis with gallbladder inflammation. Findings are compatible with acute cholecystitis. 2. Recent development of complex collections within the liver that are suggestive for intrahepatic abscesses. These collections are contiguous with the gallbladder fundus and raise concern for intrahepatic gallbladder perforation. Hepatic lesions are slightly hyperdense and there may be blood products within these collections. 3. Small hypervascular structure in the right hepatic lobe measures roughly 0.9 cm. Similar structure was present in 2018 and this is probably an incidental finding. Electronically Signed: By: Markus Daft M.D. On: 10/29/2018 18:45   Ct Perc Cholecystostomy  Result Date: 10/30/2018 INDICATION: Acute cholecystitis EXAM: CT PERCUTANEOUS CHOLECYSTOSTOMY MEDICATIONS: None ANESTHESIA/SEDATION: Fentanyl  mcg IV; Versed  mg IV Moderate Sedation Time: The patient was continuously monitored during the procedure by the interventional radiology nurse under my direct supervision. FLUOROSCOPY TIME:  None COMPLICATIONS: None immediate. PROCEDURE: Informed written consent was obtained from the patient after a thorough discussion of the procedural risks, benefits and alternatives. All questions were addressed. Maximal Sterile Barrier Technique was utilized including caps, mask, sterile gowns, sterile gloves, sterile drape, hand hygiene and skin antiseptic. A timeout was performed prior to the initiation of the procedure. The right upper quadrant was prepped with ChloraPrep in a sterile fashion, and a sterile drape was applied covering the operative field. A sterile gown and sterile gloves were used for the procedure. Under CT guidance, an 18 gauge needle was advanced into the gallbladder via transhepatic approach. It was removed over an Amplatz wire. Ten [place] dilator followed by a 10 [place]  drain were inserted. It was looped and string fixed then sewn to the skin. Cloudy bilious fluid was aspirated.  FINDINGS: Images document placement of a 10 French drain into the lumen of the gallbladder. IMPRESSION: Successful cholecystostomy. This needs to remain in place at least 4 weeks. Electronically Signed   By: Marybelle Killings M.D.   On: 10/30/2018 12:09   US Abdomen Limited Ruq  Result Date: 10/28/2018 CLINICAL DATA:  Right upper quadrant abdominal pain for 1 week. EXAM: ULTRASOUND ABDOMEN LIMITED RIGHT UPPER QUADRANT COMPARISON:  None. FINDINGS: Gallbladder: Moderately distended. 3 cm stone in the gallbladder neck. Wall is thickened and edematous, 9 mm in thickness. There is a small amount of pericholecystic fluid but no sonographic Murphy's sign. Common bile duct: Diameter: 4 mm. Liver: Increased parenchymal echogenicity. Along the inferior aspect of the liver, right lobe, there is a complex mass or lesion with a cystic central component at mildly hyperechoic peripherally, measuring 3.9 x 3.4 x 3.9 cm. There is a similar-appearing lesion also from the inferior right liver lobe, measuring 3.2 x 2.2 x 2.4 cm. These lesions are not evident on the prior CT. Portal vein is patent on color Doppler imaging with normal direction of blood flow towards the liver. IMPRESSION: 1. Large gallstone in the gallbladder neck associated with gallbladder wall thickening and edema and a small amount of pericholecystic fluid. Despite the lack of a sonographic Murphy sign, acute cholecystitis is suspected. 2. Two complex lesions are noted along the inferior margin of the right liver lobe, both of which have anechoic/cystic central areas. These may reflect abscesses. They are new since the prior abdomen and pelvis CT. 3. Hepatic steatosis. Electronically Signed   By: Lajean Manes M.D.   On: 10/28/2018 19:28     ASSESSMENT AND PLAN:   58 year old female with past medical history of coronary artery disease, ischemic  cardiomyopathy ejection fraction of 25% status post AICD, hypertension, chronic systolic CHF who presented to the hospital due to abdominal pain and suspected to have acute cholecystitis.  1.  Acute cholecystitis- WBC stable from yesterday.  Liver enzymes remain elevated. -CT abdomen pelvis 2/28 with questionable intrahepatic abscesses and possible intrahepatic gallbladder perforation. - Surgery following-plan for percutaneous cholecystostomy tube placement today via IR -Continue IV fluids, pain control, IV Zosyn  2. Liver masses - seen on abdominal ultrasound. -CT abdomen with questionable intrahepatic abscesses -Surgery following  3.  Ischemic cardiomyopathy with ejection fraction of 25% status post AICD-clinically patient is not in congestive heart failure. -Continue carvedilol, lisinopril, atorvastatin, spironolactone  4.  Essential hypertension-continue carvedilol, lisinopril.   All the records are reviewed and case discussed with Care Management/Social Worker. Management plans discussed with the patient, family and they are in agreement.  CODE STATUS: Full code  DVT Prophylaxis: Hep SQ  TOTAL TIME TAKING CARE OF THIS PATIENT: 35 minutes.   POSSIBLE D/C IN 2-3 DAYS, DEPENDING ON CLINICAL CONDITION.   Berna Spare Felipe Cabell M.D on 10/30/2018 at 2:09 PM  Between 7am to 6pm - Pager [phone]  After 6pm go to [email or web address] - Proofreader  [facility]  Office  [phone]  CC: Primary care physician; Tracie Harrier, MD

## 2018-10-30 NOTE — Plan of Care (Signed)
  Problem: Education: Goal: Knowledge of General Education information will improve Description: Including pain rating scale, medication(s)/side effects and non-pharmacologic comfort measures Outcome: Progressing   Problem: Clinical Measurements: Goal: Will remain free from infection Outcome: Progressing   Problem: Pain Managment: Goal: General experience of comfort will improve Outcome: Progressing   

## 2018-10-30 NOTE — Consult Note (Signed)
Chief Complaint: Patient was seen in consultation today for  Chief Complaint  Patient presents with  . Abdominal Pain   at the request of * No referring provider recorded for this case *  Referring Physician(s): * No referring provider recorded for this case *  Supervising Physician: Marybelle Killings  Patient Status: [name] - In-pt  History of Present Illness: Erika Rios is a 58 y.o. female with a significant cardiac history who presents with acute cholecystitis and small liver abscesses.  Her initial scan was 1 week ago which showed inflammation of the gallbladder and a gallstone.  A more recent scan demonstrated new liver abscesses.  She had a AICD placed recently.  She also has a very low EF at 25% and a history of an LAD drug-eluting stent placed.  Surgery consultation was performed and they recommend minimally invasive percutaneous drainage of the gallbladder.  Presently, the patient has left upper abdominal discomfort but denies chest pain.  Past Medical History:  Diagnosis Date  . CHF (congestive heart failure) ([place])   . GERD (gastroesophageal reflux disease)   . Hypertension   . ST elevation myocardial infarction (STEMI) of anterior wall, initial episode of care ([place]) 2017  . Tobacco abuse    quit smoking 2017    Past Surgical History:  Procedure Laterality Date  . BREAST BIOPSY Right 12/10/2016   chip remains in breast. benign  . CARDIAC CATHETERIZATION N/A 02/24/2016   Procedure: Left Heart Cath and Coronary Angiography;  Surgeon: Troy Sine, MD;  Location: [facility];  Service: Cardiovascular;  Laterality: N/A;  . CARDIAC CATHETERIZATION N/A 02/24/2016   Procedure: Coronary Stent Intervention;  Surgeon: Troy Sine, MD;  Location: [place] [facility];  Service: Cardiovascular;  Laterality: N/A;  . IMPLANTABLE CARDIOVERTER DEFIBRILLATOR (ICD) GENERATOR CHANGE Left 10/22/2018   Procedure: ICD GENERATOR INSERTION;  Surgeon: Marzetta Board, MD;  Location:  [facility];  Service: Cardiovascular;  Laterality: Left;    Allergies: Statins; Elastic bandages & [zinc]; Iron; Latex; Nsaids; and Oxycodone  Medications: Prior to Admission medications   Medication Sig Start Date End Date Taking? Authorizing Provider  acetaminophen (TYLENOL) 325 MG tablet Take 1-2 tablets (325-650 mg total) by mouth every 4 (four) hours as needed for mild pain. 10/23/18  Yes Callwood, Dwayne D, MD  aspirin EC 325 MG EC tablet Take 1 tablet (325 mg total) by mouth 2 (two) times daily. Patient taking differently: Take 325 mg by mouth daily. Once a day in the morning 04/02/16  Yes Sudini, Srikar, MD  atorvastatin (LIPITOR) 40 MG tablet Take 40 mg by mouth every other day.    Yes [provider]  carvedilol (COREG) 3.125 MG tablet Take 0.5 tablets (1.56 mg total) by mouth 2 (two) times daily with a meal. PLEASE CONTACT OFFICE FOR ADDITIONAL REFILLS Patient taking differently: Take 1.56 mg by mouth 2 (two) times daily.  09/22/16  Yes Troy Sine, MD  Cyanocobalamin (B-12) 5000 MCG CAPS Take 5,000 mcg by mouth daily.    Yes [provider]  dicyclomine (BENTYL) 20 MG tablet Take 1 tablet (20 mg total) by mouth 3 (three) times daily as needed for spasms. 10/18/18 10/18/19 Yes Harvest Dark, MD  lisinopril (PRINIVIL,ZESTRIL) 2.5 MG tablet Take 1 tablet (2.5 mg total) by mouth daily. PLEASE CONTACT OFFICE FOR ADDITIONAL REFILLS Patient taking differently: Take 2.5 mg by mouth daily. In the morning 09/22/16  Yes Troy Sine, MD  Magnesium 250 MG TABS Take 250  mg by mouth daily.   Yes [provider]  nitroGLYCERIN (NITROSTAT) 0.4 MG SL tablet PLACE 1 TABLET UNDER TONGUE EVERY 5 MINUTES IF NEEDED FOR CHEST PAIN UP TO 3 DOSES Patient taking differently: Place 0.4 mg under the tongue every 5 (five) minutes as needed for chest pain.  08/26/17  Yes Bhagat, Bhavinkumar, PA  ondansetron (ZOFRAN ODT) 4 MG disintegrating tablet Take 1 tablet (4 mg total) by mouth  every 8 (eight) hours as needed for nausea or vomiting. 10/18/18  Yes Harvest Dark, MD  oxyCODONE-acetaminophen (PERCOCET/ROXICET) 5-325 MG tablet Take 1-2 tablets by mouth every 4 (four) hours as needed for severe pain. 10/23/18  Yes Callwood, Dwayne D, MD  pantoprazole (PROTONIX) 40 MG tablet Take 1 tablet (40 mg total) by mouth 2 (two) times daily before a meal. Patient taking differently: Take 40 mg by mouth daily.  04/02/16  Yes Hillary Bow, MD  Potassium 99 MG TABS Take 198 mg by mouth every evening.   Yes [provider]  spironolactone (ALDACTONE) 25 MG tablet Take 0.5 tablets (12.5 mg total) by mouth daily. Patient taking differently: Take 12.5 mg by mouth at bedtime.  02/28/16  Yes Bhagat, Bhavinkumar, PA  triamcinolone cream (KENALOG) 0.1 % Apply 1 application topically daily. 06/02/18  Yes [provider]     Family History  Problem Relation Age of Onset  . Cancer Mother   . Heart disease Mother   . Heart attack Mother   . Breast cancer Mother   . Diabetes Father     Social History   Socioeconomic History  . Marital status: Married    Spouse name: greg  . Number of children: Not on file  . Years of education: Not on file  . Highest education level: Not on file  Occupational History  . Occupation: mail lady!!  Social Needs  . Financial resource strain: Not on file  . Food insecurity:    Worry: Not on file    Inability: Not on file  . Transportation needs:    Medical: Not on file    Non-medical: Not on file  Tobacco Use  . Smoking status: Former Smoker    Packs/day: 0.50    Years: 34.00    Pack years: 17.00    Types: Cigarettes    Last attempt to quit: 03/19/2016    Years since quitting: 2.6  . Smokeless tobacco: Never Used  Substance and Sexual Activity  . Alcohol use: No    Alcohol/week: 0.0 standard drinks  . Drug use: No  . Sexual activity: Not on file  Lifestyle  . Physical activity:    Days per week: Not on file    Minutes per  session: Not on file  . Stress: Not on file  Relationships  . Social connections:    Talks on phone: Not on file    Gets together: Not on file    Attends religious service: Not on file    Active member of club or organization: Not on file    Attends meetings of clubs or organizations: Not on file    Relationship status: Not on file  Other Topics Concern  . Not on file  Social History Narrative  . Not on file     Review of Systems: A 12 point ROS discussed and pertinent positives are indicated in the HPI above.  All other systems are negative.  Review of Systems  Vital Signs: BP 101/67   Pulse 66   Temp 98.3 F (  36.8 C) (Oral)   Resp 18   Wt 74.8 kg   SpO2 95%   BMI 29.23 kg/m   Physical Exam Constitutional:      Appearance: She is well-developed.  HENT:     Head: Normocephalic and atraumatic.  Cardiovascular:     Rate and Rhythm: Normal rate and regular rhythm.  Pulmonary:     Effort: Pulmonary effort is normal.     Breath sounds: Normal breath sounds.  Abdominal:     Palpations: Abdomen is soft.  Skin:    General: Skin is warm and dry.  Neurological:     General: No focal deficit present.     Mental Status: She is alert.        Imaging: Dg Chest 2 View  Result Date: 10/23/2018 CLINICAL DATA:  Pacemaker placement. EXAM: CHEST - 2 VIEW COMPARISON:  10/08/2018 FINDINGS: Lateral view degraded by patient arm position. Pacer/AICD device. Leads at right atrium and right ventricle. Midline trachea. Normal heart size and mediastinal contours. No pleural effusion or pneumothorax. No congestive failure. IMPRESSION: Interval placement of dual lead pacer, without pneumothorax or other acute complication. Electronically Signed   By: Abigail Miyamoto M.D.   On: 10/23/2018 07:08   Dg Chest 2 View  Result Date: 10/08/2018 CLINICAL DATA:  58 y/o female pt, preop cxr for defibrillator placement on Feb. 21st. Former smoker, quit 3 yrs ago. Pt denies SOB, CP, and cough.Sx cardiac  catheterization 2017. EXAM: CHEST - 2 VIEW COMPARISON:  04/01/2016 FINDINGS: Cardiac silhouette is normal in size and configuration. Subtle coronary artery stent noted on the lateral view. Normal mediastinal and hilar contours. Clear lungs.  No pleural effusion or pneumothorax. Skeletal structures are unremarkable. IMPRESSION: No active cardiopulmonary disease. Electronically Signed   By: Lajean Manes M.D.   On: 10/08/2018 15:36   Ct Abdomen Pelvis W Contrast  Addendum Date: 10/29/2018   ADDENDUM REPORT: 10/29/2018 18:57 ADDENDUM: These results will be called to the ordering clinician or representative by the Radiologist Assistant, and communication documented in the PACS or zVision Dashboard. Electronically Signed   By: Markus Daft M.D.   On: 10/29/2018 18:57   Result Date: 10/29/2018 CLINICAL DATA:  58 year old with possible liver abscesses and suspected acute cholecystitis. EXAM: CT ABDOMEN AND PELVIS WITH CONTRAST TECHNIQUE: Multidetector CT imaging of the abdomen and pelvis was performed using the standard protocol following bolus administration of intravenous contrast. CONTRAST:  155mL OMNIPAQUE IOHEXOL 300 MG/ML  SOLN COMPARISON:  CT from 10/18/2018 and CT from 06/07/2017 FINDINGS: Lower chest: Calcification at the base of the right middle lobe. No pleural effusions. Cardiac ICD leads. Hepatobiliary: There are several new lesions scattered throughout the liver. These lesions are predominantly in the right hepatic lobe centered around the gallbladder region. However, the lesions do extend into the medial segment of the left hepatic lobe. These lesions are hypodense compared to the adjacent parenchyma but Hounsfield units measures roughly 50. The lesions appear to be contiguous with one another. Maximum dimension of these hepatic lesions measures up to 11 cm. The adjacent gallbladder has wall thickening with stones. There is no fat plane between the gallbladder fundus and these hepatic lesions on sequence  2, image 33. There is likely a connection between the gallbladder fundus and the liver at this location. Large stone at the base of the gallbladder measures up to 2.8 cm. Pericholecystic edema. Again noted is a small hypervascular in the right hepatic lobe that has been present on previous examinations and it is  nonspecific but could represent a flash filling hemangioma. Portal venous system is patent. No biliary dilatation. Pancreas: Unremarkable. No pancreatic ductal dilatation or surrounding inflammatory changes. Spleen: Normal in size without focal abnormality. Adrenals/Urinary Tract: Normal adrenal glands. Normal appearance of the urinary bladder. Normal appearance of both kidneys without stones or hydronephrosis. No suspicious renal lesions. Stomach/Bowel: Normal appearance of stomach and small bowel. Diverticula in the sigmoid colon without acute inflammation. Normal appendix. Vascular/Lymphatic: Small lymph nodes in the porta hepatis may be reactive. Atherosclerotic calcifications in the abdominal aorta without aneurysm. Celiac trunk and SMA are patent. Reproductive: Uterus and bilateral adnexa are unremarkable. Other: No pelvic free fluid. Small amount of stranding around the gallbladder. Negative for free air. Subcutaneous gas in the right anterior lower abdomen compatible with an injection site. Musculoskeletal: No acute bone abnormality. IMPRESSION: 1. Cholelithiasis with gallbladder inflammation. Findings are compatible with acute cholecystitis. 2. Recent development of complex collections within the liver that are suggestive for intrahepatic abscesses. These collections are contiguous with the gallbladder fundus and raise concern for intrahepatic gallbladder perforation. Hepatic lesions are slightly hyperdense and there may be blood products within these collections. 3. Small hypervascular structure in the right hepatic lobe measures roughly 0.9 cm. Similar structure was present in 2018 and this is  probably an incidental finding. Electronically Signed: By: Markus Daft M.D. On: 10/29/2018 18:45   Ct Abdomen Pelvis W Contrast  Result Date: 10/18/2018 CLINICAL DATA:  Abdominal pain EXAM: CT ABDOMEN AND PELVIS WITH CONTRAST TECHNIQUE: Multidetector CT imaging of the abdomen and pelvis was performed using the standard protocol following bolus administration of intravenous contrast. CONTRAST:  134mL ISOVUE-300 IOPAMIDOL (ISOVUE-300) INJECTION 61% COMPARISON:  CT abdomen pelvis 06/07/2017 FINDINGS: Lower chest: Lung bases well aerated and clear bilaterally Hepatobiliary: Fatty liver without focal lesion. Multiple gallstones. Largest stone in the gallbladder neck measures 27 mm similar to the prior study. Additional smaller calcified gallstones in the gallbladder. No gallbladder wall thickening or biliary dilatation. Pancreas: Negative Spleen: Negative Adrenals/Urinary Tract: Adrenal glands are unremarkable. Kidneys are normal, without renal calculi, focal lesion, or hydronephrosis. Bladder is unremarkable. Stomach/Bowel: Mild sigmoid diverticulosis without diverticulitis. Normal appendix. Negative for bowel obstruction Vascular/Lymphatic: Mild atherosclerotic disease in the aorta without aneurysm. Negative for lymphadenopathy Reproductive: Normal uterus.  No pelvic mass Other: Negative for free fluid. Musculoskeletal: Mild degenerative change lumbar spine. No acute skeletal abnormality. IMPRESSION: Cholelithiasis without evidence of cholecystitis. Fatty liver Sigmoid diverticulosis without evidence of diverticulitis. Electronically Signed   By: Franchot Gallo M.D.   On: 10/18/2018 16:22   Dg C-arm 1-60 Min-no Report  Result Date: 10/22/2018 Fluoroscopy was utilized by the requesting physician.  No radiographic interpretation.   US Abdomen Limited Ruq  Result Date: 10/28/2018 CLINICAL DATA:  Right upper quadrant abdominal pain for 1 week. EXAM: ULTRASOUND ABDOMEN LIMITED RIGHT UPPER QUADRANT COMPARISON:   None. FINDINGS: Gallbladder: Moderately distended. 3 cm stone in the gallbladder neck. Wall is thickened and edematous, 9 mm in thickness. There is a small amount of pericholecystic fluid but no sonographic Murphy's sign. Common bile duct: Diameter: 4 mm. Liver: Increased parenchymal echogenicity. Along the inferior aspect of the liver, right lobe, there is a complex mass or lesion with a cystic central component at mildly hyperechoic peripherally, measuring 3.9 x 3.4 x 3.9 cm. There is a similar-appearing lesion also from the inferior right liver lobe, measuring 3.2 x 2.2 x 2.4 cm. These lesions are not evident on the prior CT. Portal vein is patent on color Doppler imaging  with normal direction of blood flow towards the liver. IMPRESSION: 1. Large gallstone in the gallbladder neck associated with gallbladder wall thickening and edema and a small amount of pericholecystic fluid. Despite the lack of a sonographic Murphy sign, acute cholecystitis is suspected. 2. Two complex lesions are noted along the inferior margin of the right liver lobe, both of which have anechoic/cystic central areas. These may reflect abscesses. They are new since the prior abdomen and pelvis CT. 3. Hepatic steatosis. Electronically Signed   By: Lajean Manes M.D.   On: 10/28/2018 19:28    Labs:  CBC: Recent Labs    10/18/18 1027 10/28/18 1643 10/29/18 0502 10/30/18 0615  WBC 11.7* 14.8* 11.1* 11.1*  HGB 14.9 15.1* 13.4 11.2*  HCT 43.9 44.1 38.7 32.7*  PLT 388 310 270 214    COAGS: Recent Labs    10/08/18 1058  INR 0.99  APTT 27    BMP: Recent Labs    10/18/18 1027 10/28/18 1643 10/29/18 0502 10/30/18 0615  NA 136 133* 135 131*  K 4.1 4.6 4.4 4.0  CL 106 100 101 98  CO2 23 24 25 24   GLUCOSE 117* 148* 144* 111*  BUN 7 12 11 13   CALCIUM 9.1 9.2 9.0 8.4*  CREATININE 0.81 0.67 0.68 0.78  GFRNONAA >60 >60 >60 >60  GFRAA >60 >60 >60 >60    LIVER FUNCTION TESTS: Recent Labs    10/18/18 1027  10/28/18 1643 10/30/18 0615  BILITOT 0.6 0.7 0.8  AST 35 95* 76*  ALT 37 58* 108*  ALKPHOS 74 107 118  PROT 7.2 7.5 6.4*  ALBUMIN 4.2 3.8 2.9*    TUMOR MARKERS: No results for input(s): AFPTM, CEA, CA199, CHROMGRNA in the last 8760 hours.  Assessment and Plan:  Acute cholecystitis.  Cholecystostomy tube is to follow.  Thank you for this interesting consult.  I greatly enjoyed meeting DUSTIE BRITTLE and look forward to participating in their care.  A copy of this report was sent to the requesting provider on this date.  Electronically Signed: Art A Leela Vanbrocklin, MD 10/30/2018, 10:09 AM   I spent a total of 40 Minutes  in face to face in clinical consultation, greater than 50% of which was counseling/coordinating care for cholecystostomy tube placement.

## 2018-10-31 DIAGNOSIS — K819 Cholecystitis, unspecified: Secondary | ICD-10-CM

## 2018-10-31 LAB — CBC
HCT: 32.7 % — ABNORMAL LOW (ref 36.0–46.0)
Hemoglobin: 10.9 g/dL — ABNORMAL LOW (ref 12.0–15.0)
MCH: 33.3 pg (ref 26.0–34.0)
MCHC: 33.3 g/dL (ref 30.0–36.0)
MCV: 100 fL (ref 80.0–100.0)
Platelets: 253 10*3/uL (ref 150–400)
RBC: 3.27 MIL/uL — ABNORMAL LOW (ref 3.87–5.11)
RDW: 12.8 % (ref 11.5–15.5)
WBC: 10.3 10*3/uL (ref 4.0–10.5)
nRBC: 0 % (ref 0.0–0.2)

## 2018-10-31 LAB — COMPREHENSIVE METABOLIC PANEL
ALT: 70 U/L — ABNORMAL HIGH (ref 0–44)
AST: 34 U/L (ref 15–41)
Albumin: 2.9 g/dL — ABNORMAL LOW (ref 3.5–5.0)
Alkaline Phosphatase: 113 U/L (ref 38–126)
Anion gap: 9 (ref 5–15)
BUN: 12 mg/dL (ref 6–20)
CALCIUM: 8.3 mg/dL — AB (ref 8.9–10.3)
CO2: 24 mmol/L (ref 22–32)
Chloride: 100 mmol/L (ref 98–111)
Creatinine, Ser: 0.71 mg/dL (ref 0.44–1.00)
GFR calc Af Amer: >60 mL/min (ref >60)
GFR calc non Af Amer: >60 mL/min (ref >60)
Glucose, Bld: 109 mg/dL — ABNORMAL HIGH (ref 70–99)
Potassium: 3.8 mmol/L (ref 3.5–5.1)
Sodium: 133 mmol/L — ABNORMAL LOW (ref 135–145)
Total Bilirubin: 0.3 mg/dL (ref 0.3–1.2)
Total Protein: 6.2 g/dL — ABNORMAL LOW (ref 6.5–8.1)

## 2018-10-31 LAB — BILIRUBIN, DIRECT: Bilirubin, Direct: 0.1 mg/dL (ref 0.0–0.2)

## 2018-10-31 NOTE — Plan of Care (Signed)
  Problem: Education: Goal: Knowledge of General Education information will improve Description Including pain rating scale, medication(s)/side effects and non-pharmacologic comfort measures Outcome: Progressing   Problem: Health Behavior/Discharge Planning: Goal: Ability to manage health-related needs will improve Outcome: Progressing   Problem: Pain Managment: Goal: General experience of comfort will improve Outcome: Progressing   Problem: Education: Goal: Ability to verbalize understanding of medication therapies will improve Outcome: Progressing Goal: Individualized Educational Video(s) Outcome: Progressing

## 2018-10-31 NOTE — Plan of Care (Signed)
  Problem: Clinical Measurements: Goal: Ability to maintain clinical measurements within normal limits will improve Outcome: Not Progressing Note:  Sodium = only 133 most recently. Will continue to monitor lab values. Wenda Low [facility]

## 2018-10-31 NOTE — Progress Notes (Signed)
[facility] [place] NAME: Erika Rios    MR#:  [id number]  DATE OF BIRTH:  09-06-1960  SUBJECTIVE:   Doing well today. Soreness around her cholecystostomy tube is less sore today. No nausea or vomiting. No fevers or chills.  REVIEW OF SYSTEMS:    Review of Systems  Constitutional: Negative for chills and fever.  HENT: Negative for congestion and tinnitus.   Eyes: Negative for blurred vision and double vision.  Respiratory: Negative for cough, shortness of breath and wheezing.   Cardiovascular: Negative for chest pain, orthopnea and PND.  Gastrointestinal: Positive for abdominal pain (RUQ). Negative for diarrhea, nausea and vomiting.  Genitourinary: Negative for dysuria and hematuria.  Neurological: Negative for dizziness, sensory change and focal weakness.  All other systems reviewed and are negative.   Nutrition: NPO Tolerating Diet: No  DRUG ALLERGIES:   Allergies  Allergen Reactions  . Statins Other (See Comments)    Hip pain, constant  . Elastic Bandages & [Zinc] Rash    Paper tape should be okay  . Iron Nausea And Vomiting  . Latex Dermatitis  . Nsaids     Told not to take d/t heart status  . Oxycodone Anxiety    VITALS:  Blood pressure (!) 110/55, pulse 67, temperature 98.4 F (36.9 C), temperature source Oral, resp. rate 16, weight 90.7 kg, SpO2 93 %.  PHYSICAL EXAMINATION:   Physical Exam  GENERAL:  58 y.o.-year-old patient lying in bed in no acute distress.  EYES: Pupils equal, round, reactive to light and accommodation. No scleral icterus. Extraocular muscles intact.  HEENT: Head atraumatic, normocephalic. Oropharynx and nasopharynx clear.  NECK:  Supple, no jugular venous distention. No thyroid enlargement, no tenderness.  LUNGS: Normal breath sounds bilaterally, no wheezing, rales, rhonchi. No use of accessory muscles of respiration.  CARDIOVASCULAR: RRR, S1, S2 normal. No murmurs, rubs, or gallops.    ABDOMEN: Soft, +Tender in RUQ, no rebound, rigidity, slightly distended. Bowel sounds present. No organomegaly or mass. +cholecystostomy tube in place. EXTREMITIES: No cyanosis, clubbing or edema b/l.    NEUROLOGIC: Cranial nerves II through XII are intact. No focal Motor or sensory deficits b/l.   PSYCHIATRIC: The patient is alert and oriented x 3.  SKIN: No obvious rash, lesion, or ulcer.    LABORATORY PANEL:   CBC Recent Labs  Lab 10/31/18 0447  WBC 10.3  HGB 10.9*  HCT 32.7*  PLT 253   ------------------------------------------------------------------------------------------------------------------  Chemistries  Recent Labs  Lab 10/31/18 0447  NA 133*  K 3.8  CL 100  CO2 24  GLUCOSE 109*  BUN 12  CREATININE 0.71  CALCIUM 8.3*  AST 34  ALT 70*  ALKPHOS 113  BILITOT 0.3   ------------------------------------------------------------------------------------------------------------------  Cardiac Enzymes Recent Labs  Lab 10/28/18 1643  TROPONINI 0.03*   ------------------------------------------------------------------------------------------------------------------  RADIOLOGY:  Ct Abdomen Pelvis W Contrast  Addendum Date: 10/29/2018   ADDENDUM REPORT: 10/29/2018 18:57 ADDENDUM: These results will be called to the ordering clinician or representative by the Radiologist Assistant, and communication documented in the PACS or zVision Dashboard. Electronically Signed   By: Markus Daft M.D.   On: 10/29/2018 18:57   Result Date: 10/29/2018 CLINICAL DATA:  58 year old with possible liver abscesses and suspected acute cholecystitis. EXAM: CT ABDOMEN AND PELVIS WITH CONTRAST TECHNIQUE: Multidetector CT imaging of the abdomen and pelvis was performed using the standard protocol following bolus administration of intravenous contrast. CONTRAST:  164mL OMNIPAQUE IOHEXOL 300 MG/ML  SOLN  COMPARISON:  CT from 10/18/2018 and CT from 06/07/2017 FINDINGS: Lower chest: Calcification  at the base of the right middle lobe. No pleural effusions. Cardiac ICD leads. Hepatobiliary: There are several new lesions scattered throughout the liver. These lesions are predominantly in the right hepatic lobe centered around the gallbladder region. However, the lesions do extend into the medial segment of the left hepatic lobe. These lesions are hypodense compared to the adjacent parenchyma but Hounsfield units measures roughly 50. The lesions appear to be contiguous with one another. Maximum dimension of these hepatic lesions measures up to 11 cm. The adjacent gallbladder has wall thickening with stones. There is no fat plane between the gallbladder fundus and these hepatic lesions on sequence 2, image 33. There is likely a connection between the gallbladder fundus and the liver at this location. Large stone at the base of the gallbladder measures up to 2.8 cm. Pericholecystic edema. Again noted is a small hypervascular in the right hepatic lobe that has been present on previous examinations and it is nonspecific but could represent a flash filling hemangioma. Portal venous system is patent. No biliary dilatation. Pancreas: Unremarkable. No pancreatic ductal dilatation or surrounding inflammatory changes. Spleen: Normal in size without focal abnormality. Adrenals/Urinary Tract: Normal adrenal glands. Normal appearance of the urinary bladder. Normal appearance of both kidneys without stones or hydronephrosis. No suspicious renal lesions. Stomach/Bowel: Normal appearance of stomach and small bowel. Diverticula in the sigmoid colon without acute inflammation. Normal appendix. Vascular/Lymphatic: Small lymph nodes in the porta hepatis may be reactive. Atherosclerotic calcifications in the abdominal aorta without aneurysm. Celiac trunk and SMA are patent. Reproductive: Uterus and bilateral adnexa are unremarkable. Other: No pelvic free fluid. Small amount of stranding around the gallbladder. Negative for free air.  Subcutaneous gas in the right anterior lower abdomen compatible with an injection site. Musculoskeletal: No acute bone abnormality. IMPRESSION: 1. Cholelithiasis with gallbladder inflammation. Findings are compatible with acute cholecystitis. 2. Recent development of complex collections within the liver that are suggestive for intrahepatic abscesses. These collections are contiguous with the gallbladder fundus and raise concern for intrahepatic gallbladder perforation. Hepatic lesions are slightly hyperdense and there may be blood products within these collections. 3. Small hypervascular structure in the right hepatic lobe measures roughly 0.9 cm. Similar structure was present in 2018 and this is probably an incidental finding. Electronically Signed: By: Markus Daft M.D. On: 10/29/2018 18:45   Ct Perc Cholecystostomy  Result Date: 10/30/2018 INDICATION: Acute cholecystitis EXAM: CT PERCUTANEOUS CHOLECYSTOSTOMY MEDICATIONS: None ANESTHESIA/SEDATION: Fentanyl  mcg IV; Versed  mg IV Moderate Sedation Time: The patient was continuously monitored during the procedure by the interventional radiology nurse under my direct supervision. FLUOROSCOPY TIME:  None COMPLICATIONS: None immediate. PROCEDURE: Informed written consent was obtained from the patient after a thorough discussion of the procedural risks, benefits and alternatives. All questions were addressed. Maximal Sterile Barrier Technique was utilized including caps, mask, sterile gowns, sterile gloves, sterile drape, hand hygiene and skin antiseptic. A timeout was performed prior to the initiation of the procedure. The right upper quadrant was prepped with ChloraPrep in a sterile fashion, and a sterile drape was applied covering the operative field. A sterile gown and sterile gloves were used for the procedure. Under CT guidance, an 18 gauge needle was advanced into the gallbladder via transhepatic approach. It was removed over an Amplatz wire. Ten [place] dilator  followed by a 10 [place] drain were inserted. It was looped and string fixed then sewn to the skin. Cloudy  bilious fluid was aspirated. FINDINGS: Images document placement of a 10 French drain into the lumen of the gallbladder. IMPRESSION: Successful cholecystostomy. This needs to remain in place at least 4 weeks. Electronically Signed   By: Marybelle Killings M.D.   On: 10/30/2018 12:09     ASSESSMENT AND PLAN:   58 year old female with past medical history of coronary artery disease, ischemic cardiomyopathy ejection fraction of 25% status post AICD, hypertension, chronic systolic CHF who presented to the hospital due to abdominal pain and suspected to have acute cholecystitis.  1.  Acute cholecystitis- Leukocytosis resolved. Liver enzymes trending down. -CT abdomen pelvis 2/28 with questionable intrahepatic abscesses and possible intrahepatic gallbladder perforation. - Surgery following -s/p percutaneous cholecystostomy tube placed 2/29 -Continue IV fluids, pain control, IV Zosyn  2. Liver masses - seen on abdominal ultrasound. -CT abdomen with questionable intrahepatic abscesses -Surgery following  3.  Ischemic cardiomyopathy with ejection fraction of 25% status post AICD-clinically patient is not in congestive heart failure. -Continue carvedilol, lisinopril, atorvastatin, spironolactone  4.  Essential hypertension-continue carvedilol, lisinopril.   All the records are reviewed and case discussed with Care Management/Social Worker. Management plans discussed with the patient, family and they are in agreement.  CODE STATUS: Full code  DVT Prophylaxis: Hep SQ  TOTAL TIME TAKING CARE OF THIS PATIENT: 35 minutes.   POSSIBLE D/C tomorrow, DEPENDING ON CLINICAL CONDITION.   Berna Spare Ludia Gartland M.D on 10/31/2018 at 4:45 PM  Between 7am to 6pm - Pager - [phone]  After 6pm go to [email or web address] - Proofreader  [facility]  Office  [phone]  CC: Primary  care physician; Tracie Harrier, MD

## 2018-10-31 NOTE — Progress Notes (Signed)
CC: Cholecystitis Subjective: Doing much better.  Tolerated diet.  No fevers or chills. W  Count decreasing  Objective: Vital signs in last 24 hours: Temp:  [97.9 F (36.6 C)-98.4 F (36.9 C)] 98.4 F (36.9 C) (03/01 0734) Pulse Rate:  [67-75] 67 (03/01 0734) Resp:  [13-20] 16 (03/01 0734) BP: (90-110)/(55-68) 110/55 (03/01 0734) SpO2:  [93 %-99 %] 93 % (03/01 0734) Weight:  [90.7 kg] 90.7 kg (03/01 0620) Last BM Date: 10/30/18  Intake/Output from previous day: 02/29 0701 - 03/01 0700 In: 279.7 [IV Piggyback:279.7] Out: 675 [Urine:600; Drains:75] Intake/Output this shift: No intake/output data recorded.  Physical exam:  NAD, alert Abd: soft, nt, drain in place. No infection, no peritonitis Ext: no edema and well perfused  Lab Results: CBC  Recent Labs    10/30/18 0615 10/31/18 0447  WBC 11.1* 10.3  HGB 11.2* 10.9*  HCT 32.7* 32.7*  PLT 214 253   BMET Recent Labs    10/30/18 0615 10/31/18 0447  NA 131* 133*  K 4.0 3.8  CL 98 100  CO2 24 24  GLUCOSE 111* 109*  BUN 13 12  CREATININE 0.78 0.71  CALCIUM 8.4* 8.3*   PT/INR No results for input(s): LABPROT, INR in the last 72 hours. ABG No results for input(s): PHART, HCO3 in the last 72 hours.  Invalid input(s): PCO2, PO2  Studies/Results: Ct Abdomen Pelvis W Contrast  Addendum Date: 10/29/2018   ADDENDUM REPORT: 10/29/2018 18:57 ADDENDUM: These results will be called to the ordering clinician or representative by the Radiologist Assistant, and communication documented in the PACS or zVision Dashboard. Electronically Signed   By: Markus Daft M.D.   On: 10/29/2018 18:57   Result Date: 10/29/2018 CLINICAL DATA:  58 year old with possible liver abscesses and suspected acute cholecystitis. EXAM: CT ABDOMEN AND PELVIS WITH CONTRAST TECHNIQUE: Multidetector CT imaging of the abdomen and pelvis was performed using the standard protocol following bolus administration of intravenous contrast. CONTRAST:  180mL  OMNIPAQUE IOHEXOL 300 MG/ML  SOLN COMPARISON:  CT from 10/18/2018 and CT from 06/07/2017 FINDINGS: Lower chest: Calcification at the base of the right middle lobe. No pleural effusions. Cardiac ICD leads. Hepatobiliary: There are several new lesions scattered throughout the liver. These lesions are predominantly in the right hepatic lobe centered around the gallbladder region. However, the lesions do extend into the medial segment of the left hepatic lobe. These lesions are hypodense compared to the adjacent parenchyma but Hounsfield units measures roughly 50. The lesions appear to be contiguous with one another. Maximum dimension of these hepatic lesions measures up to 11 cm. The adjacent gallbladder has wall thickening with stones. There is no fat plane between the gallbladder fundus and these hepatic lesions on sequence 2, image 33. There is likely a connection between the gallbladder fundus and the liver at this location. Large stone at the base of the gallbladder measures up to 2.8 cm. Pericholecystic edema. Again noted is a small hypervascular in the right hepatic lobe that has been present on previous examinations and it is nonspecific but could represent a flash filling hemangioma. Portal venous system is patent. No biliary dilatation. Pancreas: Unremarkable. No pancreatic ductal dilatation or surrounding inflammatory changes. Spleen: Normal in size without focal abnormality. Adrenals/Urinary Tract: Normal adrenal glands. Normal appearance of the urinary bladder. Normal appearance of both kidneys without stones or hydronephrosis. No suspicious renal lesions. Stomach/Bowel: Normal appearance of stomach and small bowel. Diverticula in the sigmoid colon without acute inflammation. Normal appendix. Vascular/Lymphatic: Small lymph nodes in  the porta hepatis may be reactive. Atherosclerotic calcifications in the abdominal aorta without aneurysm. Celiac trunk and SMA are patent. Reproductive: Uterus and bilateral  adnexa are unremarkable. Other: No pelvic free fluid. Small amount of stranding around the gallbladder. Negative for free air. Subcutaneous gas in the right anterior lower abdomen compatible with an injection site. Musculoskeletal: No acute bone abnormality. IMPRESSION: 1. Cholelithiasis with gallbladder inflammation. Findings are compatible with acute cholecystitis. 2. Recent development of complex collections within the liver that are suggestive for intrahepatic abscesses. These collections are contiguous with the gallbladder fundus and raise concern for intrahepatic gallbladder perforation. Hepatic lesions are slightly hyperdense and there may be blood products within these collections. 3. Small hypervascular structure in the right hepatic lobe measures roughly 0.9 cm. Similar structure was present in 2018 and this is probably an incidental finding. Electronically Signed: By: Markus Daft M.D. On: 10/29/2018 18:45   Ct Perc Cholecystostomy  Result Date: 10/30/2018 INDICATION: Acute cholecystitis EXAM: CT PERCUTANEOUS CHOLECYSTOSTOMY MEDICATIONS: None ANESTHESIA/SEDATION: Fentanyl  mcg IV; Versed  mg IV Moderate Sedation Time: The patient was continuously monitored during the procedure by the interventional radiology nurse under my direct supervision. FLUOROSCOPY TIME:  None COMPLICATIONS: None immediate. PROCEDURE: Informed written consent was obtained from the patient after a thorough discussion of the procedural risks, benefits and alternatives. All questions were addressed. Maximal Sterile Barrier Technique was utilized including caps, mask, sterile gowns, sterile gloves, sterile drape, hand hygiene and skin antiseptic. A timeout was performed prior to the initiation of the procedure. The right upper quadrant was prepped with ChloraPrep in a sterile fashion, and a sterile drape was applied covering the operative field. A sterile gown and sterile gloves were used for the procedure. Under CT guidance, an 18  gauge needle was advanced into the gallbladder via transhepatic approach. It was removed over an Amplatz wire. Ten [place] dilator followed by a 10 [place] drain were inserted. It was looped and string fixed then sewn to the skin. Cloudy bilious fluid was aspirated. FINDINGS: Images document placement of a 10 French drain into the lumen of the gallbladder. IMPRESSION: Successful cholecystostomy. This needs to remain in place at least 4 weeks. Electronically Signed   By: Marybelle Killings M.D.   On: 10/30/2018 12:09    Anti-infectives: Anti-infectives (From admission, onward)   Start     Dose/Rate Route Frequency Ordered Stop   10/29/18 0200  piperacillin-tazobactam (ZOSYN) IVPB 3.375 g     3.375 g 12.5 mL/hr over 240 Minutes Intravenous Every 8 hours 10/28/18 2101     10/28/18 2000  piperacillin-tazobactam (ZOSYN) IVPB 3.375 g     3.375 g 100 mL/hr over 30 Minutes Intravenous  Once 10/28/18 1951 10/28/18 2048      Assessment/Plan:  Cholecystitis with hepatic abscess status post cholecystostomy tube. Continue antibiotic therapy and drain. No need for surgical intervention. anticipate discharge tomorrow Time spent: 25 minutes in this encounter with greater than 50% spent in coordination and counseling of her care   Caroleen Hamman, MD, [facility]  10/31/2018

## 2018-10-31 NOTE — Plan of Care (Signed)
  Problem: Pain Managment: Goal: General experience of comfort will improve Outcome: Progressing   Problem: Skin Integrity: Goal: Risk for impaired skin integrity will decrease Outcome: Progressing   

## 2018-11-01 ENCOUNTER — Other Ambulatory Visit: Payer: Self-pay

## 2018-11-01 LAB — COMPREHENSIVE METABOLIC PANEL
ALT: 47 U/L — ABNORMAL HIGH (ref 0–44)
AST: 22 U/L (ref 15–41)
Albumin: 2.8 g/dL — ABNORMAL LOW (ref 3.5–5.0)
Alkaline Phosphatase: 115 U/L (ref 38–126)
Anion gap: 9 (ref 5–15)
BUN: 11 mg/dL (ref 6–20)
CO2: 25 mmol/L (ref 22–32)
Calcium: 8.6 mg/dL — ABNORMAL LOW (ref 8.9–10.3)
Chloride: 101 mmol/L (ref 98–111)
Creatinine, Ser: 0.71 mg/dL (ref 0.44–1.00)
GFR calc non Af Amer: >60 mL/min (ref >60)
Glucose, Bld: 107 mg/dL — ABNORMAL HIGH (ref 70–99)
Potassium: 4.3 mmol/L (ref 3.5–5.1)
SODIUM: 135 mmol/L (ref 135–145)
Total Bilirubin: 0.5 mg/dL (ref 0.3–1.2)
Total Protein: 6.6 g/dL (ref 6.5–8.1)

## 2018-11-01 LAB — CBC WITH DIFFERENTIAL/PLATELET
Abs Immature Granulocytes: 0.1 10*3/uL — ABNORMAL HIGH (ref 0.00–0.07)
Basophils Absolute: 0.1 10*3/uL (ref 0.0–0.1)
Basophils Relative: 1 %
Eosinophils Absolute: 0.2 10*3/uL (ref 0.0–0.5)
Eosinophils Relative: 2 %
HCT: 32.7 % — ABNORMAL LOW (ref 36.0–46.0)
Hemoglobin: 10.9 g/dL — ABNORMAL LOW (ref 12.0–15.0)
Immature Granulocytes: 1 %
Lymphocytes Relative: 18 %
Lymphs Abs: 1.6 10*3/uL (ref 0.7–4.0)
MCH: 33 pg (ref 26.0–34.0)
MCHC: 33.3 g/dL (ref 30.0–36.0)
MCV: 99.1 fL (ref 80.0–100.0)
Monocytes Absolute: 1.1 10*3/uL — ABNORMAL HIGH (ref 0.1–1.0)
Monocytes Relative: 13 %
Neutro Abs: 5.9 10*3/uL (ref 1.7–7.7)
Neutrophils Relative %: 65 %
Platelets: 294 10*3/uL (ref 150–400)
RBC: 3.3 MIL/uL — AB (ref 3.87–5.11)
RDW: 12.6 % (ref 11.5–15.5)
WBC: 9 10*3/uL (ref 4.0–10.5)
nRBC: 0 % (ref 0.0–0.2)

## 2018-11-01 MED ORDER — AMOXICILLIN-POT CLAVULANATE 875-125 MG PO TABS: 1.0000 | ORAL_TABLET | Freq: Two times a day (BID) | ORAL | 0 refills | Status: DC

## 2018-11-01 MED ORDER — SODIUM CHLORIDE 0.9% FLUSH: INTRAVENOUS | 0 refills | Status: DC

## 2018-11-01 NOTE — Discharge Instructions (Signed)
It was so nice to meet you during this hospitalization!  You came into the hospital with abdominal pain. You had an infection around your gallbladder. We placed a drain to help get rid of the infection. You have also been on antibiotics.  I have prescribed the following medications: 1. Please take Augmentin twice a day for the next 2 weeks- this is your antibiotic.  Please make sure you follow-up with Dr. Hampton Abbot (the surgeon) in 2 weeks.  Take care, Dr. Brett Albino   Outpatient drain care is as follows:  1. Flush drain once daily with 5 cc NS - do not aspirate. 2. Replace drainage bag after flushing. 3. Record output from drainage bag once daily. 4. Keep drain insertion site clean, dry and dressed - do not get insertion site wet while showering, do not submerge. 5. Call IR clinic if output <10 cc daily. 6. Call IR clinic for fevers, chills, worsening abdominal pain, nausea, vomiting or jaundice.    Cholecystostomy, Care After This sheet gives you information about how to care for yourself after your procedure. Your health care provider may also give you more specific instructions. If you have problems or questions, contact your health care provider. What can I expect after the procedure? After your procedure, it is common to have soreness near the incision site of your drainage tube (catheter). Follow these instructions at home: Incision care   Follow instructions from your health care provider about how to take care of your incision site where the catheter was inserted. Make sure you: ? Wash your hands with soap and water before and after you change your bandage (dressing). If soap and water are not available, use hand sanitizer. ? Change your dressing as told by your health care provider.  Check the incision site every day for signs of infection. Check for: ? Redness, swelling, or pain. ? Fluid or blood. ? Warmth. ? Pus or a bad smell.  Do not take baths, swim, or use a hot tub  until your health care provider approves. Ask your health care provider if you may take showers. You may only be allowed to take sponge baths. General instructions  Follow instructions from your health care provider about how to care for your catheter and collection bag at home.  Your health care provider will show you: ? How to record the amount of drainage from the catheter - record output once daily. ? How to flush the catheter - flush once daily with 5 mL of normal saline. ? How to care for the catheter incision site - keep clean, dry and covered with gauze.  Follow instructions from your health care provider about eating or drinking restrictions.  Take over-the-counter and prescription medicines only as told by your health care provider.  Keep all follow-up visits as told by your health care provider. This is important. Contact a health care provider if:  You have redness, swelling, or pain around the catheter incision site.  You have nausea or vomiting. Get help right away if:  Your abdominal pain gets worse.  You feel dizzy or you faint while standing.  You have fluid or blood coming from the catheter incision site.  The area around the catheter incision site feels warm to the touch.  You have pus or a bad smell coming from the catheter incision site.  You have a fever.  You have shortness of breath.  You have a rapid heartbeat.  Your nausea or vomiting does not go away.  Your catheter becomes blocked.  Your catheter comes out of your abdomen. Summary  After your procedure, it is common to have soreness near the incision site of your drainage tube (catheter).  Wash your hands with soap and water before and after you change your bandage (dressing). Change your dressing as told by your health care provider.  Check the catheter incision site every day for signs of infection. Check for redness, swelling, pain, fluid, blood, warmth, pus, or a bad smell.  Contact  your health care provider if you have nausea or vomiting, or if you have redness, swelling, or pain around your catheter incision site.  Get help right away if your abdominal pain gets worse, you feel dizzy, you have blood or fluid coming from the catheter incision site, you have a fever, or you have shortness of breath. This information is not intended to replace advice given to you by your health care provider. Make sure you discuss any questions you have with your health care provider. Document Released: 05/09/2015 Document Revised: 03/15/2018 Document Reviewed: 03/15/2018 Elsevier Interactive Patient Education  [employer].

## 2018-11-01 NOTE — Discharge Summary (Signed)
[facility] [place] NAME: Erika Rios    MR#:  [id number]  DATE OF BIRTH:  Oct 28, 1960  DATE OF ADMISSION:  10/28/2018   ADMITTING PHYSICIAN: Dustin Flock, MD  DATE OF DISCHARGE: 11/01/2018 12:30 PM  PRIMARY CARE PHYSICIAN: Tracie Harrier, MD   ADMISSION DIAGNOSIS:  Acute cholecystitis [K81.0] Cholecystitis [K81.9] DISCHARGE DIAGNOSIS:  Active Problems:   Acute cholecystitis  SECONDARY DIAGNOSIS:   Past Medical History:  Diagnosis Date  . CHF (congestive heart failure) ([place])   . GERD (gastroesophageal reflux disease)   . Hypertension   . ST elevation myocardial infarction (STEMI) of anterior wall, initial episode of care ([place]) 2017  . Tobacco abuse    quit smoking 2017   HOSPITAL COURSE:   58 year old female with past medical history of coronary artery disease, ischemic cardiomyopathy ejection fraction of 25% status post AICD, hypertension, chronic systolic CHF who presented to the hospital due to abdominal pain and suspected to have acute cholecystitis.  1.  Acute cholecystitis with liver abscess- CT abdomen pelvis 2/28 with questionable intrahepatic abscesses and possible intrahepatic gallbladder perforation. -Seen by surgery -s/p percutaneous cholecystostomy tube placed 2/29 -Initially treated with Zosyn and then transitioned to Augmentin for 2 weeks on discharge -Needs to follow-up with surgery as an outpatient -Patient also needs to follow-up in IR clinic for drain management -Will likely need follow-up imaging for liver abscesses  2.  Ischemic cardiomyopathy with ejection fraction of 25% status post AICD-clinically patient is not in congestive heart failure. -Continued carvedilol, lisinopril, atorvastatin, spironolactone  3.  Essential hypertension-continued carvedilol, lisinopril.  DISCHARGE CONDITIONS:  Acute cholecystitis s/p percutaneous closed ostomy tube placement Ischemic cardiomyopathy with EF 25%  s/p AICD placement Hypertension CONSULTS OBTAINED:  Treatment Team:  Olean Ree, MD DRUG ALLERGIES:   Allergies  Allergen Reactions  . Statins Other (See Comments)    Hip pain, constant  . Elastic Bandages & [Zinc] Rash    Paper tape should be okay  . Iron Nausea And Vomiting  . Latex Dermatitis  . Nsaids     Told not to take d/t heart status  . Oxycodone Anxiety   DISCHARGE MEDICATIONS:   Allergies as of 11/01/2018      Reactions   Statins Other (See Comments)   Hip pain, constant   Elastic Bandages & [zinc] Rash   Paper tape should be okay   Iron Nausea And Vomiting   Latex Dermatitis   Nsaids    Told not to take d/t heart status   Oxycodone Anxiety      Medication List    TAKE these medications   acetaminophen 325 MG tablet Commonly known as:  TYLENOL Take 1-2 tablets (325-650 mg total) by mouth every 4 (four) hours as needed for mild pain.   amoxicillin-clavulanate 875-125 MG tablet Commonly known as:  AUGMENTIN Take 1 tablet by mouth 2 (two) times daily.   aspirin 325 MG EC tablet Take 1 tablet (325 mg total) by mouth 2 (two) times daily. What changed:    when to take this  additional instructions   atorvastatin 40 MG tablet Commonly known as:  LIPITOR Take 40 mg by mouth every other day.   B-12 5000 MCG Caps Take 5,000 mcg by mouth daily.   carvedilol 3.125 MG tablet Commonly known as:  COREG Take 0.5 tablets (1.56 mg total) by mouth 2 (two) times daily with a meal. PLEASE CONTACT OFFICE FOR ADDITIONAL REFILLS What changed:    when to  take this  additional instructions   dicyclomine 20 MG tablet Commonly known as:  BENTYL Take 1 tablet (20 mg total) by mouth 3 (three) times daily as needed for spasms.   lisinopril 2.5 MG tablet Commonly known as:  PRINIVIL,ZESTRIL Take 1 tablet (2.5 mg total) by mouth daily. PLEASE CONTACT OFFICE FOR ADDITIONAL REFILLS What changed:  additional instructions   Magnesium 250 MG Tabs Take 250 mg by  mouth daily.   nitroGLYCERIN 0.4 MG SL tablet Commonly known as:  NITROSTAT PLACE 1 TABLET UNDER TONGUE EVERY 5 MINUTES IF NEEDED FOR CHEST PAIN UP TO 3 DOSES What changed:  See the new instructions.   ondansetron 4 MG disintegrating tablet Commonly known as:  ZOFRAN ODT Take 1 tablet (4 mg total) by mouth every 8 (eight) hours as needed for nausea or vomiting.   oxyCODONE-acetaminophen 5-325 MG tablet Commonly known as:  PERCOCET/ROXICET Take 1-2 tablets by mouth every 4 (four) hours as needed for severe pain.   pantoprazole 40 MG tablet Commonly known as:  PROTONIX Take 1 tablet (40 mg total) by mouth 2 (two) times daily before a meal. What changed:  when to take this   Potassium 99 MG Tabs Take 198 mg by mouth every evening.   sodium chloride flush 0.9 % Soln Commonly known as:  NS Flush drain with 60ml normal saline once daily. Do not aspirate.   spironolactone 25 MG tablet Commonly known as:  ALDACTONE Take 0.5 tablets (12.5 mg total) by mouth daily. What changed:  when to take this   triamcinolone cream 0.1 % Commonly known as:  KENALOG Apply 1 application topically daily.        DISCHARGE INSTRUCTIONS:  1.  Follow-up with PCP in 5 days 2.  Follow-up with surgery in 2 weeks 3.  Follow-up with IR in 6 weeks 4.  Take Augmentin twice daily for 14 days DIET:  Cardiac diet DISCHARGE CONDITION:  Stable ACTIVITY:  Activity as tolerated OXYGEN:  Home Oxygen: No.  Oxygen Delivery: room air DISCHARGE LOCATION:  home   If you experience worsening of your admission symptoms, develop shortness of breath, life threatening emergency, suicidal or homicidal thoughts you must seek medical attention immediately by calling 911 or calling your MD immediately  if symptoms less severe.  You Must read complete instructions/literature along with all the possible adverse reactions/side effects for all the Medicines you take and that have been prescribed to you. Take any new  Medicines after you have completely understood and accpet all the possible adverse reactions/side effects.   Please note  You were cared for by a hospitalist during your hospital stay. If you have any questions about your discharge medications or the care you received while you were in the hospital after you are discharged, you can call the unit and asked to speak with the hospitalist on call if the hospitalist that took care of you is not available. Once you are discharged, your primary care physician will handle any further medical issues. Please note that NO REFILLS for any discharge medications will be authorized once you are discharged, as it is imperative that you return to your primary care physician (or establish a relationship with a primary care physician if you do not have one) for your aftercare needs so that they can reassess your need for medications and monitor your lab values.    On the day of Discharge:  VITAL SIGNS:  Blood pressure 116/66, pulse 65, temperature 98 F (36.7 C), temperature source  Oral, resp. rate 18, height 5\' 3"  (1.6 m), weight 88.6 kg, SpO2 95 %. PHYSICAL EXAMINATION:  GENERAL:  58 y.o.-year-old patient lying in bed in no acute distress.  EYES: Pupils equal, round, reactive to light and accommodation. No scleral icterus. Extraocular muscles intact.  HEENT: Head atraumatic, normocephalic. Oropharynx and nasopharynx clear.  NECK:  Supple, no jugular venous distention. No thyroid enlargement, no tenderness.  LUNGS: Normal breath sounds bilaterally, no wheezing, rales, rhonchi. No use of accessory muscles of respiration.  CARDIOVASCULAR: RRR, S1, S2 normal. No murmurs, rubs, or gallops.  ABDOMEN: Soft, +Tender in RUQ, no rebound, rigidity, slightly distended. Bowel sounds present. No organomegaly or mass. +cholecystostomy tube in place with serosanguinous drainage. EXTREMITIES: No cyanosis, clubbing or edema b/l.    NEUROLOGIC: Cranial nerves II through XII are  intact. No focal Motor or sensory deficits b/l.   PSYCHIATRIC: The patient is alert and oriented x 3.  SKIN: No obvious rash, lesion, or ulcer.  DATA REVIEW:   CBC Recent Labs  Lab 11/01/18 0530  WBC 9.0  HGB 10.9*  HCT 32.7*  PLT 294    Chemistries  Recent Labs  Lab 11/01/18 0530  NA 135  K 4.3  CL 101  CO2 25  GLUCOSE 107*  BUN 11  CREATININE 0.71  CALCIUM 8.6*  AST 22  ALT 47*  ALKPHOS 115  BILITOT 0.5     Microbiology Results  Results for orders placed or performed during the hospital encounter of 10/28/18  Aerobic/Anaerobic Culture (surgical/deep wound)     Status: None (Preliminary result)   Collection Time: 10/30/18 10:45 AM  Result Value Ref Range Status   Specimen Description   Final    WOUND Performed at [facility], [address], [place], [place] [address]    Special Requests   Final    NONE Performed at [facility], [address], [place], [place] [address]    Gram Stain   Final    MODERATE WBC PRESENT, PREDOMINANTLY PMN NO ORGANISMS SEEN Performed at [facility], [place] [address], [place], [place] [address]    Culture   Final    NO GROWTH 2 DAYS NO ANAEROBES ISOLATED; CULTURE IN PROGRESS FOR 5 DAYS   Report Status PENDING  Incomplete    RADIOLOGY:  No results found.   Management plans discussed with the patient, family and they are in agreement.  CODE STATUS: Full Code   TOTAL TIME TAKING CARE OF THIS PATIENT: 45 minutes.    Berna Spare Mikhia Dusek M.D on 11/01/2018 at 4:03 PM  Between 7am to 6pm - Pager [phone]  After 6pm go to [email or web address] - Proofreader  [employer]  Hospitalists  Office  [phone]  CC: Primary care physician; Tracie Harrier, MD   Note: This dictation was prepared with Dragon dictation along with smaller phrase technology. Any transcriptional errors that result from this process are unintentional.

## 2018-11-01 NOTE — Progress Notes (Signed)
[facility] SURGICAL PROGRESS NOTE (cpt [phone])  Hospital Day(s): 4.   Post op day(s):  Marland Kitchen   Interval History: Patient seen and examined, no acute events or new complaints overnight. Patient reports that she is feeling better since the placement of her cholecystostomy tube over the weekend. No complaints of fever, chills, nauseam, emesis, chest pain, or SOB. She has been tolerating a diet. Mobilizing  Review of Systems:  Constitutional: denies fever, chills  Respiratory: denies any shortness of breath  Cardiovascular: denies chest pain or palpitations  Gastrointestinal: denies abdominal pain, N/V, diarrhea/and bowel function as per interval history Genitourinary: denies burning with urination or urinary frequency   Vital signs in last 24 hours: [min-max] current  Temp:  [98 F (36.7 C)-98.5 F (36.9 C)] 98 F (36.7 C) (03/02 0726) Pulse Rate:  [64-66] 65 (03/02 0726) Resp:  [16-18] 18 (03/02 0548) BP: (101-116)/(52-66) 116/66 (03/02 0726) SpO2:  [92 %-96 %] 95 % (03/02 0726) Weight:  [88.6 kg] 88.6 kg (03/02 0548)     Height: 5\' 3"  (160 cm) Weight: 88.6 kg BMI (Calculated): 34.61   Intake/Output this shift:  No intake/output data recorded.   Intake/Output last 2 shifts:  @IOLAST2SHIFTS @   Physical Exam:  Constitutional: alert, cooperative and no distress  HENT: normocephalic without obvious abnormality  Eyes: PERRL, EOM's grossly intact and symmetric  Respiratory: breathing non-labored at rest  Gastrointestinal: soft, non-tender, and non-distended, Cholecystostomy in RUQ with bilious drainage Musculoskeletal: no edema or wounds, motor and sensation grossly intact, NT    Labs:  CBC Latest Ref Rng & Units 11/01/2018 10/31/2018 10/30/2018  WBC 4.0 - 10.5 K/uL 9.0 10.3 11.1(H)  Hemoglobin 12.0 - 15.0 g/dL 10.9(L) 10.9(L) 11.2(L)  Hematocrit 36.0 - 46.0 % 32.7(L) 32.7(L) 32.7(L)  Platelets 150 - 400 K/uL 294 253 214   CMP Latest Ref Rng & Units 11/01/2018 10/31/2018  10/30/2018  Glucose 70 - 99 mg/dL 107(H) 109(H) 111(H)  BUN 6 - 20 mg/dL 11 12 13   Creatinine 0.44 - 1.00 mg/dL 0.71 0.71 0.78  Sodium 135 - 145 mmol/L 135 133(L) 131(L)  Potassium 3.5 - 5.1 mmol/L 4.3 3.8 4.0  Chloride 98 - 111 mmol/L 101 100 98  CO2 22 - 32 mmol/L 25 24 24   Calcium 8.9 - 10.3 mg/dL 8.6(L) 8.3(L) 8.4(L)  Total Protein 6.5 - 8.1 g/dL 6.6 6.2(L) 6.4(L)  Total Bilirubin 0.3 - 1.2 mg/dL 0.5 0.3 0.8  Alkaline Phos 38 - 126 U/L 115 113 118  AST 15 - 41 U/L 22 34 76(H)  ALT 0 - 44 U/L 47(H) 70(H) 108(H)     Imaging studies: No new pertinent imaging studies   Assessment/Plan: (ICD-10's: K81.0) 58 y.o. female with improved pain pain and leukocytosis s/p cholecystostomy tube placement for cholecystitis, complicated by pertinent comorbidities including CHF following STEMI with stent placement and echocardiogram from 08/2018 with an EF of 25% s/p ICD implantation on 02/21 on full dose ASA, HTN, obesity, and former tobacco abuse (smoking).   - Continue regular diet, pain control as needed   - Recommend 2 weeks of PO ABx (Augmentin)  - Follow up with Dr Hampton Abbot in about 2 weeks, consider referral to [facility] radiology drain clinic for further cholecystostomy tube management   All of the above findings and recommendations were discussed with the patient, patient's family, and the medical team, and all of patient's and family's questions were answered to their expressed satisfaction  -- Edison Simon, PA-C [facility] 11/01/2018, 9:24 AM [phone] M-F: 7am -  4pm

## 2018-11-01 NOTE — Progress Notes (Signed)
Referring Physician(s): Dr. Caroleen Hamman  Supervising Physician: Arne Cleveland  Patient Status:  [facility] - In-pt  Chief Complaint: Follow up percutaneous cholecystostomy placed 10/30/18 by Dr. Barbie Banner  Subjective:  Patient reports ongoing RUQ pain at drain insertion site unchanged from yesterday - abdominal pain has significantly improved since cholecystostomy placement. Feeling good otherwise, hopeful to be discharged today. Per patient she was told plan was to remove gallbladder "once things calm down."    Allergies: Statins; Elastic bandages & [zinc]; Iron; Latex; Nsaids; and Oxycodone  Medications: Prior to Admission medications   Medication Sig Start Date End Date Taking? Authorizing Provider  acetaminophen (TYLENOL) 325 MG tablet Take 1-2 tablets (325-650 mg total) by mouth every 4 (four) hours as needed for mild pain. 10/23/18  Yes Callwood, Dwayne D, MD  aspirin EC 325 MG EC tablet Take 1 tablet (325 mg total) by mouth 2 (two) times daily. Patient taking differently: Take 325 mg by mouth daily. Once a day in the morning 04/02/16  Yes Sudini, Srikar, MD  atorvastatin (LIPITOR) 40 MG tablet Take 40 mg by mouth every other day.    Yes [provider]  carvedilol (COREG) 3.125 MG tablet Take 0.5 tablets (1.56 mg total) by mouth 2 (two) times daily with a meal. PLEASE CONTACT OFFICE FOR ADDITIONAL REFILLS Patient taking differently: Take 1.56 mg by mouth 2 (two) times daily.  09/22/16  Yes Troy Sine, MD  Cyanocobalamin (B-12) 5000 MCG CAPS Take 5,000 mcg by mouth daily.    Yes [provider]  dicyclomine (BENTYL) 20 MG tablet Take 1 tablet (20 mg total) by mouth 3 (three) times daily as needed for spasms. 10/18/18 10/18/19 Yes Harvest Dark, MD  lisinopril (PRINIVIL,ZESTRIL) 2.5 MG tablet Take 1 tablet (2.5 mg total) by mouth daily. PLEASE CONTACT OFFICE FOR ADDITIONAL REFILLS Patient taking differently: Take 2.5 mg by mouth daily. In the morning 09/22/16  Yes  Troy Sine, MD  Magnesium 250 MG TABS Take 250 mg by mouth daily.   Yes [provider]  nitroGLYCERIN (NITROSTAT) 0.4 MG SL tablet PLACE 1 TABLET UNDER TONGUE EVERY 5 MINUTES IF NEEDED FOR CHEST PAIN UP TO 3 DOSES Patient taking differently: Place 0.4 mg under the tongue every 5 (five) minutes as needed for chest pain.  08/26/17  Yes Bhagat, Bhavinkumar, PA  ondansetron (ZOFRAN ODT) 4 MG disintegrating tablet Take 1 tablet (4 mg total) by mouth every 8 (eight) hours as needed for nausea or vomiting. 10/18/18  Yes Harvest Dark, MD  oxyCODONE-acetaminophen (PERCOCET/ROXICET) 5-325 MG tablet Take 1-2 tablets by mouth every 4 (four) hours as needed for severe pain. 10/23/18  Yes Callwood, Dwayne D, MD  pantoprazole (PROTONIX) 40 MG tablet Take 1 tablet (40 mg total) by mouth 2 (two) times daily before a meal. Patient taking differently: Take 40 mg by mouth daily.  04/02/16  Yes Hillary Bow, MD  Potassium 99 MG TABS Take 198 mg by mouth every evening.   Yes [provider]  spironolactone (ALDACTONE) 25 MG tablet Take 0.5 tablets (12.5 mg total) by mouth daily. Patient taking differently: Take 12.5 mg by mouth at bedtime.  02/28/16  Yes Bhagat, Bhavinkumar, PA  triamcinolone cream (KENALOG) 0.1 % Apply 1 application topically daily. 06/02/18  Yes [provider]     Vital Signs: BP 116/66 (BP Location: Left Arm)   Pulse 65   Temp 98 F (36.7 C) (Oral)   Resp 18   Ht 5\' 3"  (1.6 m)  Wt 195 lb 5.2 oz (88.6 kg)   SpO2 95%   BMI 34.60 kg/m   Physical Exam Vitals signs and nursing note reviewed.  Constitutional:      General: She is not in acute distress. HENT:     Head: Normocephalic.  Cardiovascular:     Rate and Rhythm: Normal rate and regular rhythm.     Comments: (+) ICD Pulmonary:     Effort: Pulmonary effort is normal.  Abdominal:     General: There is no distension.     Palpations: Abdomen is soft.     Tenderness: There is no abdominal  tenderness.     Comments: (+) RUQ drain to gravity bag with ~50 cc bloody serous output. Insertion site clean, dry, dressed appropriately without erythema, edema, drainage or active bleeding.    Skin:    General: Skin is warm and dry.     Coloration: Skin is not jaundiced.  Neurological:     Mental Status: She is alert. Mental status is at baseline.  Psychiatric:        Mood and Affect: Mood normal.        Behavior: Behavior normal.        Thought Content: Thought content normal.        Judgment: Judgment normal.     Imaging: Ct Abdomen Pelvis W Contrast  Addendum Date: 10/29/2018   ADDENDUM REPORT: 10/29/2018 18:57 ADDENDUM: These results will be called to the ordering clinician or representative by the Radiologist Assistant, and communication documented in the PACS or zVision Dashboard. Electronically Signed   By: Markus Daft M.D.   On: 10/29/2018 18:57   Result Date: 10/29/2018 CLINICAL DATA:  58 year old with possible liver abscesses and suspected acute cholecystitis. EXAM: CT ABDOMEN AND PELVIS WITH CONTRAST TECHNIQUE: Multidetector CT imaging of the abdomen and pelvis was performed using the standard protocol following bolus administration of intravenous contrast. CONTRAST:  173mL OMNIPAQUE IOHEXOL 300 MG/ML  SOLN COMPARISON:  CT from 10/18/2018 and CT from 06/07/2017 FINDINGS: Lower chest: Calcification at the base of the right middle lobe. No pleural effusions. Cardiac ICD leads. Hepatobiliary: There are several new lesions scattered throughout the liver. These lesions are predominantly in the right hepatic lobe centered around the gallbladder region. However, the lesions do extend into the medial segment of the left hepatic lobe. These lesions are hypodense compared to the adjacent parenchyma but Hounsfield units measures roughly 50. The lesions appear to be contiguous with one another. Maximum dimension of these hepatic lesions measures up to 11 cm. The adjacent gallbladder has wall  thickening with stones. There is no fat plane between the gallbladder fundus and these hepatic lesions on sequence 2, image 33. There is likely a connection between the gallbladder fundus and the liver at this location. Large stone at the base of the gallbladder measures up to 2.8 cm. Pericholecystic edema. Again noted is a small hypervascular in the right hepatic lobe that has been present on previous examinations and it is nonspecific but could represent a flash filling hemangioma. Portal venous system is patent. No biliary dilatation. Pancreas: Unremarkable. No pancreatic ductal dilatation or surrounding inflammatory changes. Spleen: Normal in size without focal abnormality. Adrenals/Urinary Tract: Normal adrenal glands. Normal appearance of the urinary bladder. Normal appearance of both kidneys without stones or hydronephrosis. No suspicious renal lesions. Stomach/Bowel: Normal appearance of stomach and small bowel. Diverticula in the sigmoid colon without acute inflammation. Normal appendix. Vascular/Lymphatic: Small lymph nodes in the porta hepatis may be reactive. Atherosclerotic  calcifications in the abdominal aorta without aneurysm. Celiac trunk and SMA are patent. Reproductive: Uterus and bilateral adnexa are unremarkable. Other: No pelvic free fluid. Small amount of stranding around the gallbladder. Negative for free air. Subcutaneous gas in the right anterior lower abdomen compatible with an injection site. Musculoskeletal: No acute bone abnormality. IMPRESSION: 1. Cholelithiasis with gallbladder inflammation. Findings are compatible with acute cholecystitis. 2. Recent development of complex collections within the liver that are suggestive for intrahepatic abscesses. These collections are contiguous with the gallbladder fundus and raise concern for intrahepatic gallbladder perforation. Hepatic lesions are slightly hyperdense and there may be blood products within these collections. 3. Small hypervascular  structure in the right hepatic lobe measures roughly 0.9 cm. Similar structure was present in 2018 and this is probably an incidental finding. Electronically Signed: By: Markus Daft M.D. On: 10/29/2018 18:45   Ct Perc Cholecystostomy  Result Date: 10/30/2018 INDICATION: Acute cholecystitis EXAM: CT PERCUTANEOUS CHOLECYSTOSTOMY MEDICATIONS: None ANESTHESIA/SEDATION: Fentanyl  mcg IV; Versed  mg IV Moderate Sedation Time: The patient was continuously monitored during the procedure by the interventional radiology nurse under my direct supervision. FLUOROSCOPY TIME:  None COMPLICATIONS: None immediate. PROCEDURE: Informed written consent was obtained from the patient after a thorough discussion of the procedural risks, benefits and alternatives. All questions were addressed. Maximal Sterile Barrier Technique was utilized including caps, mask, sterile gowns, sterile gloves, sterile drape, hand hygiene and skin antiseptic. A timeout was performed prior to the initiation of the procedure. The right upper quadrant was prepped with ChloraPrep in a sterile fashion, and a sterile drape was applied covering the operative field. A sterile gown and sterile gloves were used for the procedure. Under CT guidance, an 18 gauge needle was advanced into the gallbladder via transhepatic approach. It was removed over an Amplatz wire. Ten [place] dilator followed by a 10 [place] drain were inserted. It was looped and string fixed then sewn to the skin. Cloudy bilious fluid was aspirated. FINDINGS: Images document placement of a 10 French drain into the lumen of the gallbladder. IMPRESSION: Successful cholecystostomy. This needs to remain in place at least 4 weeks. Electronically Signed   By: Marybelle Killings M.D.   On: 10/30/2018 12:09   US Abdomen Limited Ruq  Result Date: 10/28/2018 CLINICAL DATA:  Right upper quadrant abdominal pain for 1 week. EXAM: ULTRASOUND ABDOMEN LIMITED RIGHT UPPER QUADRANT COMPARISON:  None. FINDINGS:  Gallbladder: Moderately distended. 3 cm stone in the gallbladder neck. Wall is thickened and edematous, 9 mm in thickness. There is a small amount of pericholecystic fluid but no sonographic Murphy's sign. Common bile duct: Diameter: 4 mm. Liver: Increased parenchymal echogenicity. Along the inferior aspect of the liver, right lobe, there is a complex mass or lesion with a cystic central component at mildly hyperechoic peripherally, measuring 3.9 x 3.4 x 3.9 cm. There is a similar-appearing lesion also from the inferior right liver lobe, measuring 3.2 x 2.2 x 2.4 cm. These lesions are not evident on the prior CT. Portal vein is patent on color Doppler imaging with normal direction of blood flow towards the liver. IMPRESSION: 1. Large gallstone in the gallbladder neck associated with gallbladder wall thickening and edema and a small amount of pericholecystic fluid. Despite the lack of a sonographic Murphy sign, acute cholecystitis is suspected. 2. Two complex lesions are noted along the inferior margin of the right liver lobe, both of which have anechoic/cystic central areas. These may reflect abscesses. They are new since the prior abdomen and  pelvis CT. 3. Hepatic steatosis. Electronically Signed   By: Lajean Manes M.D.   On: 10/28/2018 19:28    Labs:  CBC: Recent Labs    10/29/18 0502 10/30/18 0615 10/31/18 0447 11/01/18 0530  WBC 11.1* 11.1* 10.3 9.0  HGB 13.4 11.2* 10.9* 10.9*  HCT 38.7 32.7* 32.7* 32.7*  PLT 270 214 253 294    COAGS: Recent Labs    10/08/18 1058  INR 0.99  APTT 27    BMP: Recent Labs    10/29/18 0502 10/30/18 0615 10/31/18 0447 11/01/18 0530  NA 135 131* 133* 135  K 4.4 4.0 3.8 4.3  CL 101 98 100 101  CO2 25 24 24 25   GLUCOSE 144* 111* 109* 107*  BUN 11 13 12 11   CALCIUM 9.0 8.4* 8.3* 8.6*  CREATININE 0.68 0.78 0.71 0.71  GFRNONAA >60 >60 >60 >60  GFRAA >60 >60 >60 >60    LIVER FUNCTION TESTS: Recent Labs    10/28/18 1643 10/30/18 0615  10/31/18 0447 11/01/18 0530  BILITOT 0.7 0.8 0.3 0.5  AST 95* 76* 34 22  ALT 58* 108* 70* 47*  ALKPHOS 107 118 113 115  PROT 7.5 6.4* 6.2* 6.6  ALBUMIN 3.8 2.9* 2.9* 2.8*    Assessment and Plan:  58 y/o F who presented to [facility] ED on 10/28/18 for epigastric pain x 2 weeks - found to have acute cholecystitis with large gallstone in the gallbladder neck with associated gallbladder wall thickening, edema, small amount of pericholecystic fluid. Also noted to have two complex lesions along the inferior margin of the right liver lobe on [place] - CT abdomen pelvis with contrast showed concern for intrahepatic gallbladder perforation. Given patient's significant cardiac history general surgery recommended least invasive procedure for cholecystitis and percutaneous cholecystostomy was successfully placed 10/30/18 by Dr. Barbie Banner.  Output remains good - 75 cc in last 24H, no issues with flushing reported by staff. Afebrile, WBC improved to 9.0 - currently on Zosyn per primary team. T.bili 0.5, AST 22, ALT 47, ALP 115.   Per chart patient possibly for d/c today - she will need to follow up with IR clinic in 6-8 weeks (IR scheduler will call patient with appointment date and time) if she does not undergo cholecystectomy with general surgery. Discussed drain care with patient and husband at bedside today who state understanding.  Outpatient drain care is as follows:  1. Flush drain once daily with 5 cc NS - do not aspirate. 2. Replace drainage bag after flushing. 3. Record output from drainage bag once daily. 4. Keep drain insertion site clean, dry and dressed - do not get insertion site wet while showering, do not submerge. 5. Call IR clinic if output <10 cc daily. 6. Call IR clinic for fevers, chills, worsening abdominal pain, nausea, vomiting or jaundice.   I have placed orders for follow up, rx for NS flushes and included above instructions on patient's d/c instructions. Please call IR with questions or  concerns.   Electronically Signed: Joaquim Nam, PA-C 11/01/2018, 8:45 AM   I spent a total of 25 Minutes at the the patient's bedside AND on the patient's hospital floor or unit, greater than 50% of which was counseling/coordinating care for cholecystostomy follow up.

## 2018-11-01 NOTE — Progress Notes (Signed)
Patient alert and oriented, vss.   Had husband return demonstration how to flush drain and how to change dressing.  Gave him some supplies to last a few days but he also has Rx for saline flushes and antibiotics.    D/C telemetry and PIV.  Escorted out of hospital via wheelchair by volunteers.

## 2018-11-02 ENCOUNTER — Other Ambulatory Visit: Payer: Self-pay | Admitting: Surgery

## 2018-11-04 ENCOUNTER — Other Ambulatory Visit: Payer: Self-pay | Admitting: Surgery

## 2018-11-04 ENCOUNTER — Other Ambulatory Visit: Payer: Self-pay | Admitting: Physician Assistant

## 2018-11-04 DIAGNOSIS — K810 Acute cholecystitis: Secondary | ICD-10-CM

## 2018-11-04 LAB — AEROBIC/ANAEROBIC CULTURE W GRAM STAIN (SURGICAL/DEEP WOUND): Culture: NO GROWTH

## 2018-11-12 ENCOUNTER — Telehealth: Payer: Self-pay | Admitting: *Deleted

## 2018-11-12 NOTE — Telephone Encounter (Signed)
Hi, she received a two week course of the antibiotic.  It should be fine to stop when the course is completed on Monday 3/16.  Thanks!

## 2018-11-12 NOTE — Telephone Encounter (Signed)
Patient called and is schedule to come see you on 11/17/18. She is currently on amoxicillin and her last day of taking it will be on Monday 11/15/18. Does she need to take another round of it or would she be fine to stop taking it on Monday since it will be last dose?

## 2018-11-17 ENCOUNTER — Ambulatory Visit (INDEPENDENT_AMBULATORY_CARE_PROVIDER_SITE_OTHER): Payer: BC Managed Care – PPO | Admitting: Surgery

## 2018-11-17 ENCOUNTER — Encounter: Payer: Self-pay | Admitting: Surgery

## 2018-11-17 ENCOUNTER — Other Ambulatory Visit: Payer: Self-pay

## 2018-11-17 ENCOUNTER — Encounter: Payer: Self-pay | Admitting: *Deleted

## 2018-11-17 VITALS — BP 132/84 | HR 68 | Temp 97.9°F | Resp 18 | Ht 63.0 in | Wt 203.0 lb

## 2018-11-17 DIAGNOSIS — K810 Acute cholecystitis: Secondary | ICD-10-CM

## 2018-11-17 MED ORDER — SODIUM CHLORIDE 0.9% FLUSH: INTRAVENOUS | 0 refills | Status: DC

## 2018-11-17 NOTE — Progress Notes (Signed)
Patient has been scheduled for a cholangiogram for 11-23-18 at 10 am (arrive 9:45 am). Prep: none.  The patient will follow up as scheduled with Dr. Hampton Abbot for 12-08-18 at 10 am.   Patient aware of the above and instructed to call the office should she have further questions.

## 2018-11-17 NOTE — Progress Notes (Signed)
11/17/2018  History of Present Illness: Erika Rios is a 58 y.o. female with recent hospitalization for acute cholecystitis with liver abscess, s/p percutaneous cholecystostomy tube.  She was discharged from hospital on 11/01/18 and presents today for follow up.  She completed her antibiotic course.  She reports having some discomfort in the right upper quadrant.  The drain has about 10-20 ml output of serosanguinous fluid per day.  Able to eat well, tolerating diet, without any nausea or vomiting.  Denies any fevers, chills, chest pain, shortness of breath.  Past Medical History: Past Medical History:  Diagnosis Date  . CHF (congestive heart failure) ([place])   . GERD (gastroesophageal reflux disease)   . Hypertension   . ST elevation myocardial infarction (STEMI) of anterior wall, initial episode of care ([place]) 2017  . Tobacco abuse    quit smoking 2017     Past Surgical History: Past Surgical History:  Procedure Laterality Date  . BREAST BIOPSY Right 12/10/2016   chip remains in breast. benign  . CARDIAC CATHETERIZATION N/A 02/24/2016   Procedure: Left Heart Cath and Coronary Angiography;  Surgeon: Troy Sine, MD;  Location: [place] [facility];  Service: Cardiovascular;  Laterality: N/A;  . CARDIAC CATHETERIZATION N/A 02/24/2016   Procedure: Coronary Stent Intervention;  Surgeon: Troy Sine, MD;  Location: [place] [facility];  Service: Cardiovascular;  Laterality: N/A;  . IMPLANTABLE CARDIOVERTER DEFIBRILLATOR (ICD) GENERATOR CHANGE Left 10/22/2018   Procedure: ICD GENERATOR INSERTION;  Surgeon: Marzetta Board, MD;  Location: [facility] ORS;  Service: Cardiovascular;  Laterality: Left;    Home Medications: Prior to Admission medications   Medication Sig Start Date End Date Taking? Authorizing Provider  acetaminophen (TYLENOL) 325 MG tablet Take 1-2 tablets (325-650 mg total) by mouth every 4 (four) hours as needed for mild pain. 10/23/18  Yes Callwood, Dwayne D, MD  aspirin EC 325  MG EC tablet Take 1 tablet (325 mg total) by mouth 2 (two) times daily. Patient taking differently: Take 325 mg by mouth daily. Once a day in the morning 04/02/16  Yes Sudini, Srikar, MD  atorvastatin (LIPITOR) 40 MG tablet Take 40 mg by mouth every other day.    Yes [provider]  carvedilol (COREG) 3.125 MG tablet Take 0.5 tablets (1.56 mg total) by mouth 2 (two) times daily with a meal. PLEASE CONTACT OFFICE FOR ADDITIONAL REFILLS Patient taking differently: Take 1.56 mg by mouth 2 (two) times daily.  09/22/16  Yes Troy Sine, MD  Cyanocobalamin (B-12) 5000 MCG CAPS Take 5,000 mcg by mouth daily.    Yes [provider]  lisinopril (PRINIVIL,ZESTRIL) 2.5 MG tablet Take 1 tablet (2.5 mg total) by mouth daily. PLEASE CONTACT OFFICE FOR ADDITIONAL REFILLS Patient taking differently: Take 2.5 mg by mouth daily. In the morning 09/22/16  Yes Troy Sine, MD  Magnesium 250 MG TABS Take 250 mg by mouth daily.   Yes [provider]  nitroGLYCERIN (NITROSTAT) 0.4 MG SL tablet PLACE 1 TABLET UNDER TONGUE EVERY 5 MINUTES IF NEEDED FOR CHEST PAIN UP TO 3 DOSES Patient taking differently: Place 0.4 mg under the tongue every 5 (five) minutes as needed for chest pain.  08/26/17  Yes Bhagat, Bhavinkumar, PA  ondansetron (ZOFRAN ODT) 4 MG disintegrating tablet Take 1 tablet (4 mg total) by mouth every 8 (eight) hours as needed for nausea or vomiting. 10/18/18  Yes Harvest Dark, MD  pantoprazole (PROTONIX) 40 MG tablet Take 1 tablet (40 mg total) by mouth  2 (two) times daily before a meal. Patient taking differently: Take 40 mg by mouth daily.  04/02/16  Yes Hillary Bow, MD  Potassium 99 MG TABS Take 198 mg by mouth every evening.   Yes [provider]  sodium chloride flush (NS) 0.9 % SOLN Flush drain with 53ml normal saline once daily. Do not aspirate. 11/17/18  Yes Leiliana Foody, Jacqulyn Bath, MD  spironolactone (ALDACTONE) 25 MG tablet Take 0.5 tablets (12.5 mg total) by mouth  daily. Patient taking differently: Take 12.5 mg by mouth at bedtime.  02/28/16  Yes Bhagat, Bhavinkumar, PA  triamcinolone cream (KENALOG) 0.1 % Apply 1 application topically daily. 06/02/18  Yes [provider]    Allergies: Allergies  Allergen Reactions  . Statins Other (See Comments) and Rash    Hip pain, constant Hip pain, constant  . Elastic Bandages & [Zinc] Rash    Paper tape should be okay  . Iron Nausea And Vomiting  . Latex Dermatitis  . Nsaids     Told not to take d/t heart status  . Oxycodone Anxiety    Review of Systems: Review of Systems  Constitutional: Negative for chills and fever.  Respiratory: Negative for shortness of breath.   Cardiovascular: Negative for chest pain.  Gastrointestinal: Positive for abdominal pain. Negative for nausea and vomiting.    Physical Exam BP 132/84   Pulse 68   Temp 97.9 F (36.6 C) (Temporal)   Resp 18   Ht 5\' 3"  (1.6 m)   Wt 203 lb (92.1 kg)   SpO2 98%   BMI 35.96 kg/m  CONSTITUTIONAL: No acute distress, well nourished. HEENT:  Normocephalic, atraumatic, extraocular motion intact. RESPIRATORY:  Lungs are clear, and breath sounds are equal bilaterally. Normal respiratory effort without pathologic use of accessory muscles. CARDIOVASCULAR: Heart is regular without murmurs, gallops, or rubs. GI: The abdomen is soft, non-distended, with some tenderness in right upper quadrant at the right costal edge near the drain site.  Negative Murphy's sign.  Drain with serosanguinous fluid.   NEUROLOGIC:  Motor and sensation is grossly normal.  Cranial nerves are grossly intact. PSYCH:  Alert and oriented to person, place and time. Affect is normal.  Labs/Imaging: None recently  Assessment and Plan: This is a 58 y.o. female with acute cholecystitis and liver abscess, s/p percutaneous cholecystostomy tube.    --Discussed with the patient that currently she does not need further antibiotics as she's clinically well. --Will  obtain cholangiogram next week to evaluate drain and liver abscess.  If unclear picture, may need CT scan to evaluate the liver better.   --Discussed with the patient that the goal is still to proceed with surgery, but we need to wait at least 6 weeks from the episode to allow the inflammation to calm down.  The drain would stay in place until surgery. --Given the COVID-19 strategy, we would not be able to do her surgery until at least 12/15/18.  For now, continue with daily drain flushes and monitoring output.  New prescription given for saline flushes. --She will follow up in 3 weeks to discuss cholangiogram +/- CT scan, and surgery date.  At that point we'll also obtain cardiac clearance too.  Face-to-face time spent with the patient and care providers was 25 minutes, with more than 50% of the time spent counseling, educating, and coordinating care of the patient.     Melvyn Neth, [facility]

## 2018-11-17 NOTE — Patient Instructions (Addendum)
We will schedule the Cholangiogram.  We will see you back in 3 weeks after the study has been done.   Continue to flush the drain daily. Please take your prescription to your pharmacy.   Cholecystitis  Cholecystitis is irritation and swelling (inflammation) of the gallbladder. The gallbladder is an organ that is shaped like a pear. It is under the liver on the right side of the body. This organ stores bile. Bile helps the body break down (digest) the fats in food. This condition can occur all of a sudden. It needs to be treated. What are the causes? This condition may be caused by stones or lumps that form in the gallbladder (gallstones). Gallstones can block the tube (duct) that carries bile out of your gallbladder. Other causes are:  Damage to the gallbladder due to less blood flow.  Germs in the bile ducts.  Scars or kinks in the bile ducts.  Abnormal growths (tumors) in the liver, pancreas, or gallbladder. What increases the risk? You are more likely to develop this condition if:  You have sickle cell disease.  You take birth control pills.  You use estrogen.  You have alcoholic liver disease.  You have liver cirrhosis.  You are being fed through a vein.  You are very ill.  You do not eat or drink for a long time. This is also called "fasting."  You are overweight (obese).  You lose weight too fast.  You are pregnant.  You have high levels of fat in the blood (triglycerides).  You have irritation and swelling of the pancreas (pancreatitis). What are the signs or symptoms? Symptoms of this condition include:  Pain in the belly (abdomen). Pain is often in the upper right area of the belly.  Tenderness or bloating in the belly.  Feeling sick to your stomach (nauseous).  Throwing up (vomiting).  Fever.  Chills. How is this diagnosed? This condition may be diagnosed with a medical history and exam. You may also have other tests, such as:  Imaging  tests. This may include: ? Ultrasound. ? CT scan of the belly. ? Nuclear scan. This is also called a HIDA scan. This scan lets your doctor see the bile as it moves in your body. ? MRI.  Blood tests. These are done to check: ? Your blood count. The white blood cell count may be higher than normal. ? How well your liver works. How is this treated? This condition may be treated with:  Surgery to take out your gallbladder.  Antibiotic medicines to treat illnesses caused by germs.  Going without food for some time.  Giving fluids through an IV tube.  Medicines to treat pain or throwing up. Follow these instructions at home:  If you had surgery, follow instructions from your doctor about how to care for yourself after you go home. Medicines   Take over-the-counter and prescription medicines only as told by your doctor.  If you were prescribed an antibiotic medicine, take it as told by your doctor. Do not stop taking it even if you start to feel better. General instructions  Follow instructions from your doctor about what to eat or drink. Do not eat or drink anything that makes you sick again.  Do not lift anything that is heavier than 10 lb (4.5 kg) until your doctor says that it is safe.  Do not use any products that contain nicotine or tobacco, such as cigarettes and e-cigarettes. If you need help quitting, ask your doctor.  Keep all follow-up visits as told by your doctor. This is important. Contact a doctor if:  You have pain and your medicine does not help.  You have a fever. Get help right away if:  Your pain moves to: ? Another part of your belly. ? Your back.  Your symptoms do not go away.  You have new symptoms. Summary  Cholecystitis is swelling and irritation of the gallbladder.  This condition may be caused by stones or lumps that form in the gallbladder (gallstones).  Common symptoms are pain in the belly. You may feel sick to your stomach and start  throwing up. You may also have a fever and chills.  This condition may be treated with surgery to take out the gallbladder. It may also be treated with medicines, fasting, and fluids through an IV tube.  Follow what you are told about eating and drinking. Do not eat things that make you sick again. This information is not intended to replace advice given to you by your health care provider. Make sure you discuss any questions you have with your health care provider. Document Released: 08/07/2011 Document Revised: 12/25/2017 Document Reviewed: 12/25/2017 Elsevier Interactive Patient Education  2019 [employer].

## 2018-11-23 ENCOUNTER — Other Ambulatory Visit: Payer: Self-pay

## 2018-11-23 ENCOUNTER — Ambulatory Visit
Admission: RE | Admit: 2018-11-23 | Discharge: 2018-11-23 | Disposition: A | Discharge status: Home / Self Care | Payer: BC Managed Care – PPO | Source: Ambulatory Visit | Attending: Surgery | Admitting: Surgery

## 2018-11-23 DIAGNOSIS — K810 Acute cholecystitis: Secondary | ICD-10-CM | POA: Diagnosis not present

## 2018-11-23 MED ORDER — IOPAMIDOL (ISOVUE-300) INJECTION 61%
10.0000 mL | Freq: Once | INTRAVENOUS | Status: AC | PRN
  Administered 2018-11-23: 10 mL

## 2018-11-25 ENCOUNTER — Other Ambulatory Visit: Payer: Self-pay | Admitting: *Deleted

## 2018-11-25 ENCOUNTER — Telehealth: Payer: Self-pay | Admitting: *Deleted

## 2018-11-25 DIAGNOSIS — K810 Acute cholecystitis: Secondary | ICD-10-CM

## 2018-11-25 NOTE — Telephone Encounter (Signed)
Per Dr. Hampton Abbot, patient needs to be scheduled for a CT abdomen with contrast.  Patient has been scheduled for this at [facility] for 11-29-18 at 11:30 am (arrive 11:15 am). Prep: NPO 4 hours prior and pick up prep kit. Patient verbalizes understanding.  Office visit was rescheduled from 12-08-18 to 12-10-18 due to changes in MD schedule with COVID-19.   Patient aware of the above and verbalizes understanding. She will call the office if she has further questions.

## 2018-11-26 ENCOUNTER — Other Ambulatory Visit: Payer: Self-pay | Admitting: Surgery

## 2018-11-26 ENCOUNTER — Other Ambulatory Visit: Payer: Self-pay

## 2018-11-26 ENCOUNTER — Ambulatory Visit
Admission: RE | Admit: 2018-11-26 | Discharge: 2018-11-26 | Disposition: A | Discharge status: Home / Self Care | Payer: BC Managed Care – PPO | Source: Ambulatory Visit | Attending: Surgery | Admitting: Surgery

## 2018-11-26 DIAGNOSIS — R1084 Generalized abdominal pain: Secondary | ICD-10-CM

## 2018-11-26 DIAGNOSIS — K750 Abscess of liver: Secondary | ICD-10-CM | POA: Insufficient documentation

## 2018-11-26 DIAGNOSIS — K810 Acute cholecystitis: Secondary | ICD-10-CM

## 2018-11-26 MED ORDER — IOHEXOL 300 MG/ML  SOLN
100.0000 mL | Freq: Once | INTRAMUSCULAR | Status: AC | PRN
  Administered 2018-11-26: 100 mL via INTRAVENOUS

## 2018-11-26 MED ORDER — AMOXICILLIN-POT CLAVULANATE 875-125 MG PO TABS: 1.0000 | ORAL_TABLET | Freq: Two times a day (BID) | ORAL | 0 refills | Status: AC

## 2018-11-26 NOTE — Progress Notes (Signed)
11/26/18  Patient presented to Dr. Linton Ham office complaining of abdominal pain at the drain site.  Labs were obtained and were overall unremarkable with normal WBC and normal LFTs.  CT scan abdomen/pelvis with contrast was obtained as well, and shows percutaneous cholecystostomy drain in good position, with decompressed gallbladder and decrease in size of her liver abscess.  Discussed imaging with Dr. Barbie Banner with radiology and no new drain is needed at this time.  Will repeat imaging study in 4 weeks to reassess abscess and possibly exchange percutaneous drain.  He recommended resuming antibiotics as a precaution as well.  Patient was called and findings were discussed.  Will order Augmentin 3 week course.  Patient had dilaudid po ordered by Dr. Ginette Pitman for 5 day course.  Patient will follow up with me on 12/10/18 to re-evaluate her symptoms and drain.   Olean Ree, MD

## 2018-11-29 ENCOUNTER — Ambulatory Visit: Admission: RE | Admit: 2018-11-29 | Payer: BC Managed Care – PPO | Source: Ambulatory Visit

## 2018-12-08 ENCOUNTER — Ambulatory Visit: Payer: BC Managed Care – PPO | Admitting: Surgery

## 2018-12-09 ENCOUNTER — Other Ambulatory Visit: Payer: BC Managed Care – PPO

## 2018-12-10 ENCOUNTER — Encounter: Payer: Self-pay | Admitting: Surgery

## 2018-12-10 ENCOUNTER — Ambulatory Visit (INDEPENDENT_AMBULATORY_CARE_PROVIDER_SITE_OTHER): Payer: BC Managed Care – PPO | Admitting: Surgery

## 2018-12-10 ENCOUNTER — Other Ambulatory Visit: Payer: Self-pay

## 2018-12-10 VITALS — BP 118/79 | HR 59 | Temp 97.7°F | Resp 18 | Ht 63.0 in | Wt 207.0 lb

## 2018-12-10 DIAGNOSIS — K810 Acute cholecystitis: Secondary | ICD-10-CM | POA: Diagnosis not present

## 2018-12-10 DIAGNOSIS — R1011 Right upper quadrant pain: Secondary | ICD-10-CM

## 2018-12-10 DIAGNOSIS — K750 Abscess of liver: Secondary | ICD-10-CM

## 2018-12-10 MED ORDER — AMOXICILLIN-POT CLAVULANATE 875-125 MG PO TABS: 1.0000 | ORAL_TABLET | Freq: Two times a day (BID) | ORAL | 0 refills | Status: DC

## 2018-12-10 NOTE — Patient Instructions (Addendum)
CT scan scheduled for 12/30/2018 @ 8:30 am .  [facility] [address] [place] [address]  Nothing to eat/drink after midnight the night prior to your testing.  Please stop by there today and pick up your prep kit and instructions.   Please pick up your medications at the pharmacy.   Please see your follow up visit below.

## 2018-12-10 NOTE — Progress Notes (Signed)
12/10/2018  History of Present Illness: Erika Rios is a 58 y.o. female with recent history of acute cholecystitis and resulting liver abscess, s/p percutaneous cholecystostomy tube on 2/29.  She presents for follow up.  She had a cholangiogram on 3/24 which showed a patent cystic duct and CBD, but there was contrast still extravasating into the liver.  CT scan done on 3/27 showed persistent but smaller liver abscess with drain in good position.  I have independently viewed the patient's imaging studies and agree with the findings.  Based on the findings, I restarted patient on Augmentin course for another 3 weeks and has one week left.  She reports that once starting the second course of Augmentin, she has been feeling better with less pain.  There is only some discomfort when the drain pulls on her skin.  Denies any nausea, fever, chills, chest pain, shortness of breath.  Tolerating a diet.  Past Medical History: Past Medical History:  Diagnosis Date  . CHF (congestive heart failure) ([place])   . GERD (gastroesophageal reflux disease)   . Hypertension   . ST elevation myocardial infarction (STEMI) of anterior wall, initial episode of care ([place]) 2017  . Tobacco abuse    quit smoking 2017     Past Surgical History: Past Surgical History:  Procedure Laterality Date  . BREAST BIOPSY Right 12/10/2016   chip remains in breast. benign  . CARDIAC CATHETERIZATION N/A 02/24/2016   Procedure: Left Heart Cath and Coronary Angiography;  Surgeon: Troy Sine, MD;  Location: [facility];  Service: Cardiovascular;  Laterality: N/A;  . CARDIAC CATHETERIZATION N/A 02/24/2016   Procedure: Coronary Stent Intervention;  Surgeon: Troy Sine, MD;  Location: [facility];  Service: Cardiovascular;  Laterality: N/A;  . IMPLANTABLE CARDIOVERTER DEFIBRILLATOR (ICD) GENERATOR CHANGE Left 10/22/2018   Procedure: ICD GENERATOR INSERTION;  Surgeon: Marzetta Board, MD;  Location: [facility];  Service:  Cardiovascular;  Laterality: Left;    Home Medications: Prior to Admission medications   Medication Sig Start Date End Date Taking? Authorizing Provider  acetaminophen (TYLENOL) 325 MG tablet Take 1-2 tablets (325-650 mg total) by mouth every 4 (four) hours as needed for mild pain. 10/23/18  Yes Callwood, Dwayne D, MD  amoxicillin-clavulanate (AUGMENTIN) 875-125 MG tablet Take 1 tablet by mouth 2 (two) times daily for 21 days. 11/26/18 12/17/18 Yes Nayshawn Mesta, Jacqulyn Bath, MD  aspirin EC 325 MG EC tablet Take 1 tablet (325 mg total) by mouth 2 (two) times daily. Patient taking differently: Take 325 mg by mouth daily. Once a day in the morning 04/02/16  Yes Sudini, Artist, MD  carvedilol (COREG) 3.125 MG tablet Take 0.5 tablets (1.56 mg total) by mouth 2 (two) times daily with a meal. PLEASE CONTACT OFFICE FOR ADDITIONAL REFILLS Patient taking differently: Take 1.56 mg by mouth 2 (two) times daily.  09/22/16  Yes Troy Sine, MD  Cyanocobalamin (B-12) 5000 MCG CAPS Take 5,000 mcg by mouth daily.    Yes [provider]  HYDROmorphone (DILAUDID) 2 MG tablet TAKE 1 TABLET (2 MG TOTAL) BY MOUTH 3 (THREE) TIMES DAILY AS NEEDED FOR PAIN FOR UP TO 5 DAYS 11/26/18  Yes [provider]  lisinopril (PRINIVIL,ZESTRIL) 2.5 MG tablet Take 1 tablet (2.5 mg total) by mouth daily. PLEASE CONTACT OFFICE FOR ADDITIONAL REFILLS Patient taking differently: Take 2.5 mg by mouth daily. In the morning 09/22/16  Yes Troy Sine, MD  Magnesium 250 MG TABS Take 250 mg by mouth daily.  Yes [provider]  nitroGLYCERIN (NITROSTAT) 0.4 MG SL tablet PLACE 1 TABLET UNDER TONGUE EVERY 5 MINUTES IF NEEDED FOR CHEST PAIN UP TO 3 DOSES Patient taking differently: Place 0.4 mg under the tongue every 5 (five) minutes as needed for chest pain.  08/26/17  Yes Bhagat, Bhavinkumar, PA  ondansetron (ZOFRAN ODT) 4 MG disintegrating tablet Take 1 tablet (4 mg total) by mouth every 8 (eight) hours as needed for nausea or  vomiting. 10/18/18  Yes Harvest Dark, MD  pantoprazole (PROTONIX) 40 MG tablet Take 1 tablet (40 mg total) by mouth 2 (two) times daily before a meal. Patient taking differently: Take 40 mg by mouth daily.  04/02/16  Yes Hillary Bow, MD  Potassium 99 MG TABS Take 198 mg by mouth every evening.   Yes [provider]  sodium chloride 0.9 % injection FLUSH DRAIN WITH 5 ML NORMAL SALINE ONCE DAILY. DO NOT ASPIRATE 11/17/18  Yes [provider]  sodium chloride flush (NS) 0.9 % SOLN Flush drain with 11ml normal saline once daily. Do not aspirate. 11/17/18  Yes Merrin Mcvicker, Jacqulyn Bath, MD  spironolactone (ALDACTONE) 25 MG tablet Take 0.5 tablets (12.5 mg total) by mouth daily. Patient taking differently: Take 12.5 mg by mouth at bedtime.  02/28/16  Yes Bhagat, Bhavinkumar, PA  triamcinolone cream (KENALOG) 0.1 % Apply 1 application topically daily. 06/02/18  Yes [provider]  amoxicillin-clavulanate (AUGMENTIN) 875-125 MG tablet Take 1 tablet by mouth 2 (two) times daily for 21 days. 12/10/18 12/31/18  Olean Ree, MD    Allergies: Allergies  Allergen Reactions  . Statins Other (See Comments) and Rash    Hip pain, constant Hip pain, constant  . Elastic Bandages & [Zinc] Rash    Paper tape should be okay  . Iron Nausea And Vomiting  . Latex Dermatitis  . Nsaids     Told not to take d/t heart status  . Oxycodone Anxiety    Review of Systems: Review of Systems  Constitutional: Negative for chills and fever.  Respiratory: Negative for shortness of breath.   Cardiovascular: Negative for chest pain.  Gastrointestinal: Negative for abdominal pain, nausea and vomiting.    Physical Exam BP 118/79   Pulse (!) 59   Temp 97.7 F (36.5 C) (Temporal)   Resp 18   Ht 5\' 3"  (1.6 m)   Wt 207 lb (93.9 kg)   SpO2 95%   BMI 36.67 kg/m  CONSTITUTIONAL: No acute distress HEENT:  Normocephalic, atraumatic, extraocular motion intact. RESPIRATORY:  Lungs are clear, and breath  sounds are equal bilaterally. Normal respiratory effort without pathologic use of accessory muscles. CARDIOVASCULAR: Heart is regular without murmurs, gallops, or rubs. GI: The abdomen is soft, non-distended, non-tender to palpation.  Drain in place with bilious fluid in bag. There were no palpable masses.  NEUROLOGIC:  Motor and sensation is grossly normal.  Cranial nerves are grossly intact. PSYCH:  Alert and oriented to person, place and time. Affect is normal.  Labs/Imaging: Cholangiogram 3/24: FINDINGS: The drain is well positioned in the lumen of the gallbladder. A gallstone is identified. The cystic and common bile ducts are patent as contrast fills the biliary tree and duodenum.  CT scan abdomen/pelvis 3/27: IMPRESSION: Interval placement of percutaneous cholecystostomy. Gallbladder is significantly decompressed compared to prior exam. Large solitary gallstone is again noted. Adjacent low densities in the hepatic parenchyma are also significantly decreased compared to prior exam, consistent with improving inflammation or abscesses compared to prior exam.  Assessment and Plan:  This is a 58 y.o. female with acute cholecystitis and liver abscess, s/p percutaneous cholecystostomy tube placement.  Imaging studies reviewed with the patient.  She can see that the abscess is smaller, but still present.  Though she is very anxious to have the drain removed and undergo cholecystectomy she understands that the drain is working and the abscess is smaller.  She also understands that unfortunately due to COVID-19 restrictions, I am unable to take her to the operating room until at least June 1st.  In the meantime, since the abscess is still present, the drain should continue.  Will order repeat CT scan abdomen towards the end of April to check on the liver abscess.  Discussed with her that if the abscess is not improving or is worsening, she may require a 2nd drain placement.  As the antibiotic course  is working still and she still has an abscess, will continue the antibiotic.  Will prescribe a repeat 3 week course of Augmentin so she can continue this when her current course is completed.  Patient understands this plan and all of her questions have been answered.  Face-to-face time spent with the patient and care providers was 25 minutes, with more than 50% of the time spent counseling, educating, and coordinating care of the patient.     Melvyn Neth, [facility]

## 2018-12-30 ENCOUNTER — Ambulatory Visit: Payer: BC Managed Care – PPO

## 2018-12-30 ENCOUNTER — Other Ambulatory Visit: Payer: Self-pay

## 2018-12-30 ENCOUNTER — Ambulatory Visit
Admission: RE | Admit: 2018-12-30 | Discharge: 2018-12-30 | Disposition: A | Discharge status: Home / Self Care | Payer: BC Managed Care – PPO | Source: Ambulatory Visit | Attending: Surgery | Admitting: Surgery

## 2018-12-30 DIAGNOSIS — R1011 Right upper quadrant pain: Secondary | ICD-10-CM | POA: Diagnosis not present

## 2018-12-30 LAB — POCT I-STAT CREATININE: Creatinine, Ser: 0.6 mg/dL (ref 0.44–1.00)

## 2018-12-30 MED ORDER — IOHEXOL 300 MG/ML  SOLN
100.0000 mL | Freq: Once | INTRAMUSCULAR | Status: AC | PRN
  Administered 2018-12-30: 11:00:00 100 mL via INTRAVENOUS

## 2018-12-31 ENCOUNTER — Other Ambulatory Visit: Payer: Self-pay

## 2018-12-31 ENCOUNTER — Telehealth: Payer: Self-pay

## 2018-12-31 ENCOUNTER — Telehealth (INDEPENDENT_AMBULATORY_CARE_PROVIDER_SITE_OTHER): Payer: BC Managed Care – PPO | Admitting: Surgery

## 2018-12-31 DIAGNOSIS — K810 Acute cholecystitis: Secondary | ICD-10-CM | POA: Diagnosis not present

## 2018-12-31 DIAGNOSIS — K8020 Calculus of gallbladder without cholecystitis without obstruction: Secondary | ICD-10-CM

## 2018-12-31 MED ORDER — AMOXICILLIN-POT CLAVULANATE 875-125 MG PO TABS: 1.0000 | ORAL_TABLET | Freq: Two times a day (BID) | ORAL | 0 refills | Status: AC

## 2018-12-31 MED ORDER — SODIUM CHLORIDE 0.9 % IJ SOLN: 5.0000 mL | Freq: Every day | INTRAMUSCULAR | 0 refills | Status: DC

## 2018-12-31 NOTE — Progress Notes (Signed)
Virtual Visit via Telephone Note  I connected with [name] on 12/31/18 at 10:30 AM EDT by telephone and verified that I am speaking with the correct person using two identifiers.  Location: Patient: Home Provider: Office   I discussed the limitations, risks, security and privacy concerns of performing an evaluation and management service by telephone and the availability of in person appointments. I also discussed with the patient that there may be a patient responsible charge related to this service. The patient expressed understanding and agreed to proceed.  This service was provided via telemedicine.  The patient consented to the visit being carried via telemedicine.  Patient's location: Home  Provider's location: Office  Referring Provider: None  People participating in this telemedicine visit: Myself, patient  Time spent: 15 minutes   History of Present Illness: This is a 58 year old female presenting as a telephone follow-up for acute cholecystitis and liver abscess.  I have been seeing her for the past few months with the same and she had a CAT scan done yesterday 4/30 which shows that her liver abscess continues to decrease in size and is much smaller now compared to the last CT scan at the end of March.  Previously instead of 3 components to the abscess, no other only 2 in each 1 is more than the previous ones.  The drain still in place in good position with a large stone in the gallbladder.  I have independently seen the imaging study and agree with the findings.  Patient reports that she still getting some soreness or burning sensation at the drain insertion site particularly depending how she moves or may tug on the drain.  She still flushing the drain once daily as instructed.  Denies any fevers, chest pain, shortness of breath, abdominal pain, nausea, vomiting.  Still taking antibiotics and has 4 more week left.   Observations/Objective: Patient is in no acute  distress.  She appears comfortable.  Able to carry a conversation with no respiratory issues  Assessment and Plan: 58 year old female with acute cholecystitis and liver abscess, status post percutaneous cholecystostomy tube placement  - Discussed with the patient that her abscess indeed is getting smaller and now there are only 2 components to it except instead of 3.  The drain is in good place and working well.  I understand the patient is annoyed and tired of having the drain in place but at this point reminded her that the drain is doing exactly what needs to be doing that she is indeed improving.  At this point we still also have restrictions from COVID-19 to do any elective cases in hospital and as such, were still okay to continue waiting as the abscess still present.  I believe the symptoms that she describes with pulling or tugging or burning sensation are related to the drain itself and irritation to the skin.  I do not suspect there being any infection at the skin itself but more irritation from the suture and the drain pulling - She continues to heal and improve, I will refill her prescription for the Augmentin antibiotics that she can continue treating the liver abscess.  We will also refill her prescription for sodium chloride flushes to flush her drain. - We will schedule a repeat CT scan for the end of May in order to evaluate her liver abscess.  At that point I would expect the abscess to be almost resolved will have a follow-up appointment with her in the office towards the  end of May as well so that we can discuss the CT scan results as well as trying to schedule her for surgery for laparoscopic cholecystectomy.  She is in agreement with this plan and all of her questions have been answered.  Follow Up Instructions:    I discussed the assessment and treatment plan with the patient. The patient was provided an opportunity to ask questions and all were answered. The patient agreed with  the plan and demonstrated an understanding of the instructions.   The patient was advised to call back or seek an in-person evaluation if the symptoms worsen or if the condition fails to improve as anticipated.  I provided 15 minutes of non-face-to-face time during this encounter.   Olean Ree, MD

## 2018-12-31 NOTE — Telephone Encounter (Signed)
Patient notified.   CTscan scheduled 01/25/2019 @ 1 pm @ [facility].  .Nothing to eat or drink 4 hours prior. Patient to pick up prep.   Dr.Piscoya 01/28/19 @ 9:15 am office visit.

## 2019-01-25 ENCOUNTER — Other Ambulatory Visit: Payer: Self-pay

## 2019-01-25 ENCOUNTER — Encounter: Payer: Self-pay | Admitting: Surgery

## 2019-01-25 ENCOUNTER — Ambulatory Visit
Admission: RE | Admit: 2019-01-25 | Discharge: 2019-01-25 | Disposition: A | Discharge status: Home / Self Care | Payer: BC Managed Care – PPO | Source: Ambulatory Visit | Attending: Surgery | Admitting: Surgery

## 2019-01-25 DIAGNOSIS — K8020 Calculus of gallbladder without cholecystitis without obstruction: Secondary | ICD-10-CM

## 2019-01-25 MED ORDER — IOHEXOL 300 MG/ML  SOLN
100.0000 mL | Freq: Once | INTRAMUSCULAR | Status: AC | PRN
  Administered 2019-01-25: 13:00:00 100 mL via INTRAVENOUS

## 2019-01-28 ENCOUNTER — Encounter: Payer: Self-pay | Admitting: Surgery

## 2019-01-28 ENCOUNTER — Telehealth: Payer: Self-pay | Admitting: *Deleted

## 2019-01-28 ENCOUNTER — Ambulatory Visit (INDEPENDENT_AMBULATORY_CARE_PROVIDER_SITE_OTHER): Payer: BC Managed Care – PPO | Admitting: Surgery

## 2019-01-28 ENCOUNTER — Other Ambulatory Visit: Payer: Self-pay

## 2019-01-28 ENCOUNTER — Encounter: Payer: Self-pay | Admitting: *Deleted

## 2019-01-28 ENCOUNTER — Other Ambulatory Visit
Admission: RE | Admit: 2019-01-28 | Discharge: 2019-01-28 | Disposition: A | Discharge status: Home / Self Care | Payer: BC Managed Care – PPO | Source: Ambulatory Visit | Attending: Surgery | Admitting: Surgery

## 2019-01-28 VITALS — BP 138/76 | HR 72 | Temp 97.7°F | Ht 63.0 in | Wt 208.0 lb

## 2019-01-28 DIAGNOSIS — K750 Abscess of liver: Secondary | ICD-10-CM | POA: Diagnosis not present

## 2019-01-28 DIAGNOSIS — K810 Acute cholecystitis: Secondary | ICD-10-CM

## 2019-01-28 DIAGNOSIS — Z1159 Encounter for screening for other viral diseases: Secondary | ICD-10-CM | POA: Diagnosis not present

## 2019-01-28 NOTE — Telephone Encounter (Signed)
Patient aware to stop aspirin and hold until after surgery per Dr. Hampton Abbot.   The patient verbalizes understanding.

## 2019-01-28 NOTE — H&P (View-Only) (Signed)
01/28/2019  History of Present Illness: Erika Rios is a 58 y.o. female with a history of acute cholecystitis requiring percutaneous cholecystostomy tube, with also finding of liver abscess during imaging studies.  She has had this drain now for 3 months and has had serial CT scans every month to evaluate the abscess.  She had a CT scan on 5/26 which showed a slight decrease in the liver abscess down to 2.5 x 1.8 cm.  The percutaneous drain is in place with a single large gallstone in the neck of the gallbladder.  The patient reports that she has not had any dietary issues and is able to tolerate a diet with no nausea or vomiting.  Denies any abdominal pain after eating.  Denies any constipation or diarrhea.  However her biggest complaint is of pain related to the drain itself.  She reports that at the insertion site the pain is significant at times and thus preventing some activities.  She notes that the skin around the drain is irritated and she is ready for this drain to come out.  The drain is emptying clear fluid and she reports about 10-20 milliliters per day.  She denies any fevers has been taking her antibiotics throughout this time to help with the abscess.  Past Medical History: Past Medical History:  Diagnosis Date  . CHF (congestive heart failure) ([place])   . GERD (gastroesophageal reflux disease)   . ST elevation myocardial infarction (STEMI) of anterior wall, initial episode of care ([place]) 2017  . Tobacco abuse    quit smoking 2017     Past Surgical History: Past Surgical History:  Procedure Laterality Date  . BREAST BIOPSY Right 12/10/2016   chip remains in breast. benign  . CARDIAC CATHETERIZATION N/A 02/24/2016   Procedure: Left Heart Cath and Coronary Angiography;  Surgeon: Troy Sine, MD;  Location: [facility];  Service: Cardiovascular;  Laterality: N/A;  . CARDIAC CATHETERIZATION N/A 02/24/2016   Procedure: Coronary Stent Intervention;  Surgeon: Troy Sine, MD;  Location: [place] [facility];  Service: Cardiovascular;  Laterality: N/A;  . IMPLANTABLE CARDIOVERTER DEFIBRILLATOR (ICD) GENERATOR CHANGE Left 10/22/2018   Procedure: ICD GENERATOR INSERTION;  Surgeon: Marzetta Board, MD;  Location: [facility];  Service: Cardiovascular;  Laterality: Left;    Home Medications: Prior to Admission medications   Medication Sig Start Date End Date Taking? Authorizing Provider  acetaminophen (TYLENOL) 325 MG tablet Take 1-2 tablets (325-650 mg total) by mouth every 4 (four) hours as needed for mild pain. 10/23/18  Yes Callwood, Dwayne D, MD  aspirin EC 325 MG EC tablet Take 1 tablet (325 mg total) by mouth 2 (two) times daily. Patient taking differently: Take 325 mg by mouth daily. Once a day in the morning 04/02/16  Yes Sudini, Artist, MD  carvedilol (COREG) 3.125 MG tablet Take 0.5 tablets (1.56 mg total) by mouth 2 (two) times daily with a meal. PLEASE CONTACT OFFICE FOR ADDITIONAL REFILLS Patient taking differently: Take 1.56 mg by mouth 2 (two) times daily.  09/22/16  Yes Troy Sine, MD  Cyanocobalamin (B-12) 5000 MCG CAPS Take 5,000 mcg by mouth daily.    Yes [provider]  HYDROmorphone (DILAUDID) 2 MG tablet TAKE 1 TABLET (2 MG TOTAL) BY MOUTH 3 (THREE) TIMES DAILY AS NEEDED FOR PAIN FOR UP TO 5 DAYS 11/26/18  Yes [provider]  lisinopril (PRINIVIL,ZESTRIL) 2.5 MG tablet Take 1 tablet (2.5 mg total) by mouth daily. PLEASE CONTACT OFFICE  FOR ADDITIONAL REFILLS Patient taking differently: Take 2.5 mg by mouth daily. In the morning 09/22/16  Yes Troy Sine, MD  Magnesium 250 MG TABS Take 250 mg by mouth daily.   Yes [provider]  nitroGLYCERIN (NITROSTAT) 0.4 MG SL tablet PLACE 1 TABLET UNDER TONGUE EVERY 5 MINUTES IF NEEDED FOR CHEST PAIN UP TO 3 DOSES Patient taking differently: Place 0.4 mg under the tongue every 5 (five) minutes as needed for chest pain.  08/26/17  Yes Bhagat, Bhavinkumar, PA  ondansetron  (ZOFRAN ODT) 4 MG disintegrating tablet Take 1 tablet (4 mg total) by mouth every 8 (eight) hours as needed for nausea or vomiting. 10/18/18  Yes Harvest Dark, MD  pantoprazole (PROTONIX) 40 MG tablet Take 1 tablet (40 mg total) by mouth 2 (two) times daily before a meal. Patient taking differently: Take 40 mg by mouth daily.  04/02/16  Yes Hillary Bow, MD  Potassium 99 MG TABS Take 198 mg by mouth every evening.   Yes [provider]  sodium chloride 0.9 % injection 5 mLs by Intracatheter route daily. Flush drain with 5 ml normal saline once daily.  Do not aspirate. 12/31/18  Yes Sukhraj Esquivias, MD  sodium chloride flush (NS) 0.9 % SOLN Flush drain with 4ml normal saline once daily. Do not aspirate. 11/17/18  Yes Jasyn Mey, Jacqulyn Bath, MD  spironolactone (ALDACTONE) 25 MG tablet Take 0.5 tablets (12.5 mg total) by mouth daily. Patient taking differently: Take 12.5 mg by mouth at bedtime.  02/28/16  Yes Bhagat, Bhavinkumar, PA  triamcinolone cream (KENALOG) 0.1 % Apply 1 application topically daily. 06/02/18  Yes [provider]    Allergies: Allergies  Allergen Reactions  . Statins Other (See Comments) and Rash    Hip pain, constant Hip pain, constant  . Elastic Bandages & [Zinc] Rash    Paper tape should be okay  . Iron Nausea And Vomiting  . Latex Dermatitis  . Nsaids     Told not to take d/t heart status  . Oxycodone Anxiety    Review of Systems: Review of Systems  Constitutional: Negative for chills and fever.  Respiratory: Negative for shortness of breath.   Cardiovascular: Negative for chest pain.  Gastrointestinal: Negative for abdominal pain, nausea and vomiting.    Physical Exam BP 138/76   Pulse 72   Temp 97.7 F (36.5 C) (Skin)   Ht 5\' 3"  (1.6 m)   Wt 208 lb (94.3 kg)   SpO2 98%   BMI 36.85 kg/m  CONSTITUTIONAL: No acute distress HEENT:  Normocephalic, atraumatic, extraocular motion intact. RESPIRATORY:  Lungs are clear, and breath sounds are equal  bilaterally. Normal respiratory effort without pathologic use of accessory muscles. CARDIOVASCULAR: Heart is regular without murmurs, gallops, or rubs. GI: The abdomen is soft, nondistended, nontender to palpation except at the drain insertion site itself.  The skin around the drain is clean and dry with only some mild reactive erythema.  Suture is in place securing the drain.  Clear fluid in the drainage bag. NEUROLOGIC:  Motor and sensation is grossly normal.  Cranial nerves are grossly intact. PSYCH:  Alert and oriented to person, place and time. Affect is normal.  Labs/Imaging: CT scan 01/25/19: IMPRESSION: Redemonstrated postprocedural findings of percutaneous cholecystostomy with a formed pigtail in the gallbladder fundus and a large gallstone in the gallbladder. When compared to immediate prior dated 12/30/2018, there is no significant interval change in a 2.5 x 1.8 cm collection in the adjacent right lobe of the  liver, hepatic segment V, consistent with abscess or biloma.   Assessment and Plan: This is a 58 y.o. female with a history of acute cholecystitis and liver abscess requiring percutaneous cholecystostomy tube placement.  Discussed the patient's CT scan with Dr. Lavona Mound with interventional radiology and he notes that this remaining abscess is too small to try to place a new drainage catheter.  At this time there is no significant change or maybe only slight decrease in size and I do not think that the drain that she has is currently helping as much as it used to before.  As such, I think the patient would be ready now for a cholecystectomy.  Discussed with the patient the risks of bleeding, infection, and injury to surrounding structures.  Plan will be for a laparoscopic cholecystectomy with the chance for potential having to convert to open.  We will remove the percutaneous drain at that point but there is a possibility that she may require a new drain a we encountered the remaining  abscess and are able to drain it.  She has a history of MI and a recent pacemaker placement on 2/21 and is taking a full aspirin for this.  We will stop her aspirin today for preparation for surgery on 02/02/2019.  Face-to-face time spent with the patient and care providers was 25 minutes, with more than 50% of the time spent counseling, educating, and coordinating care of the patient.     Melvyn Neth, [facility]

## 2019-01-28 NOTE — Progress Notes (Signed)
Patient's surgery to be scheduled for 02-02-19 at [facility] with Dr. Hampton Abbot.  The patient is aware to have COVID-19 testing done immediately after she leaves the office today at the [facility] drive thru ([address] [place]). Patient will have labs drawn at that time as well.   The patient will be contacted on 02-01-19 at 1:45 pm to have a phone interview.   She is aware to check in at the [facility] entrance where she will be screened for the coronavirus and then sent to Same Day Surgery.   The patient verbalizes understanding of the above.   The patient is aware to call the office should she have further questions.

## 2019-01-28 NOTE — Progress Notes (Signed)
01/28/2019  History of Present Illness: Erika Rios is a 58 y.o. female with a history of acute cholecystitis requiring percutaneous cholecystostomy tube, with also finding of liver abscess during imaging studies.  She has had this drain now for 3 months and has had serial CT scans every month to evaluate the abscess.  She had a CT scan on 5/26 which showed a slight decrease in the liver abscess down to 2.5 x 1.8 cm.  The percutaneous drain is in place with a single large gallstone in the neck of the gallbladder.  The patient reports that she has not had any dietary issues and is able to tolerate a diet with no nausea or vomiting.  Denies any abdominal pain after eating.  Denies any constipation or diarrhea.  However her biggest complaint is of pain related to the drain itself.  She reports that at the insertion site the pain is significant at times and thus preventing some activities.  She notes that the skin around the drain is irritated and she is ready for this drain to come out.  The drain is emptying clear fluid and she reports about 10-20 milliliters per day.  She denies any fevers has been taking her antibiotics throughout this time to help with the abscess.  Past Medical History: Past Medical History:  Diagnosis Date  . CHF (congestive heart failure) ([place])   . GERD (gastroesophageal reflux disease)   . ST elevation myocardial infarction (STEMI) of anterior wall, initial episode of care ([place]) 2017  . Tobacco abuse    quit smoking 2017     Past Surgical History: Past Surgical History:  Procedure Laterality Date  . BREAST BIOPSY Right 12/10/2016   chip remains in breast. benign  . CARDIAC CATHETERIZATION N/A 02/24/2016   Procedure: Left Heart Cath and Coronary Angiography;  Surgeon: Troy Sine, MD;  Location: [place] [facility];  Service: Cardiovascular;  Laterality: N/A;  . CARDIAC CATHETERIZATION N/A 02/24/2016   Procedure: Coronary Stent Intervention;  Surgeon: Troy Sine, MD;  Location: [facility];  Service: Cardiovascular;  Laterality: N/A;  . IMPLANTABLE CARDIOVERTER DEFIBRILLATOR (ICD) GENERATOR CHANGE Left 10/22/2018   Procedure: ICD GENERATOR INSERTION;  Surgeon: Marzetta Board, MD;  Location: [facility];  Service: Cardiovascular;  Laterality: Left;    Home Medications: Prior to Admission medications   Medication Sig Start Date End Date Taking? Authorizing Provider  acetaminophen (TYLENOL) 325 MG tablet Take 1-2 tablets (325-650 mg total) by mouth every 4 (four) hours as needed for mild pain. 10/23/18  Yes Callwood, Dwayne D, MD  aspirin EC 325 MG EC tablet Take 1 tablet (325 mg total) by mouth 2 (two) times daily. Patient taking differently: Take 325 mg by mouth daily. Once a day in the morning 04/02/16  Yes Sudini, Artist, MD  carvedilol (COREG) 3.125 MG tablet Take 0.5 tablets (1.56 mg total) by mouth 2 (two) times daily with a meal. PLEASE CONTACT OFFICE FOR ADDITIONAL REFILLS Patient taking differently: Take 1.56 mg by mouth 2 (two) times daily.  09/22/16  Yes Troy Sine, MD  Cyanocobalamin (B-12) 5000 MCG CAPS Take 5,000 mcg by mouth daily.    Yes [provider]  HYDROmorphone (DILAUDID) 2 MG tablet TAKE 1 TABLET (2 MG TOTAL) BY MOUTH 3 (THREE) TIMES DAILY AS NEEDED FOR PAIN FOR UP TO 5 DAYS 11/26/18  Yes [provider]  lisinopril (PRINIVIL,ZESTRIL) 2.5 MG tablet Take 1 tablet (2.5 mg total) by mouth daily. PLEASE CONTACT OFFICE  FOR ADDITIONAL REFILLS Patient taking differently: Take 2.5 mg by mouth daily. In the morning 09/22/16  Yes Troy Sine, MD  Magnesium 250 MG TABS Take 250 mg by mouth daily.   Yes [provider]  nitroGLYCERIN (NITROSTAT) 0.4 MG SL tablet PLACE 1 TABLET UNDER TONGUE EVERY 5 MINUTES IF NEEDED FOR CHEST PAIN UP TO 3 DOSES Patient taking differently: Place 0.4 mg under the tongue every 5 (five) minutes as needed for chest pain.  08/26/17  Yes Bhagat, Bhavinkumar, PA  ondansetron  (ZOFRAN ODT) 4 MG disintegrating tablet Take 1 tablet (4 mg total) by mouth every 8 (eight) hours as needed for nausea or vomiting. 10/18/18  Yes Harvest Dark, MD  pantoprazole (PROTONIX) 40 MG tablet Take 1 tablet (40 mg total) by mouth 2 (two) times daily before a meal. Patient taking differently: Take 40 mg by mouth daily.  04/02/16  Yes Hillary Bow, MD  Potassium 99 MG TABS Take 198 mg by mouth every evening.   Yes [provider]  sodium chloride 0.9 % injection 5 mLs by Intracatheter route daily. Flush drain with 5 ml normal saline once daily.  Do not aspirate. 12/31/18  Yes Bryony Kaman, MD  sodium chloride flush (NS) 0.9 % SOLN Flush drain with 79ml normal saline once daily. Do not aspirate. 11/17/18  Yes Nataliya Graig, Jacqulyn Bath, MD  spironolactone (ALDACTONE) 25 MG tablet Take 0.5 tablets (12.5 mg total) by mouth daily. Patient taking differently: Take 12.5 mg by mouth at bedtime.  02/28/16  Yes Bhagat, Bhavinkumar, PA  triamcinolone cream (KENALOG) 0.1 % Apply 1 application topically daily. 06/02/18  Yes [provider]    Allergies: Allergies  Allergen Reactions  . Statins Other (See Comments) and Rash    Hip pain, constant Hip pain, constant  . Elastic Bandages & [Zinc] Rash    Paper tape should be okay  . Iron Nausea And Vomiting  . Latex Dermatitis  . Nsaids     Told not to take d/t heart status  . Oxycodone Anxiety    Review of Systems: Review of Systems  Constitutional: Negative for chills and fever.  Respiratory: Negative for shortness of breath.   Cardiovascular: Negative for chest pain.  Gastrointestinal: Negative for abdominal pain, nausea and vomiting.    Physical Exam BP 138/76   Pulse 72   Temp 97.7 F (36.5 C) (Skin)   Ht 5\' 3"  (1.6 m)   Wt 208 lb (94.3 kg)   SpO2 98%   BMI 36.85 kg/m  CONSTITUTIONAL: No acute distress HEENT:  Normocephalic, atraumatic, extraocular motion intact. RESPIRATORY:  Lungs are clear, and breath sounds are equal  bilaterally. Normal respiratory effort without pathologic use of accessory muscles. CARDIOVASCULAR: Heart is regular without murmurs, gallops, or rubs. GI: The abdomen is soft, nondistended, nontender to palpation except at the drain insertion site itself.  The skin around the drain is clean and dry with only some mild reactive erythema.  Suture is in place securing the drain.  Clear fluid in the drainage bag. NEUROLOGIC:  Motor and sensation is grossly normal.  Cranial nerves are grossly intact. PSYCH:  Alert and oriented to person, place and time. Affect is normal.  Labs/Imaging: CT scan 01/25/19: IMPRESSION: Redemonstrated postprocedural findings of percutaneous cholecystostomy with a formed pigtail in the gallbladder fundus and a large gallstone in the gallbladder. When compared to immediate prior dated 12/30/2018, there is no significant interval change in a 2.5 x 1.8 cm collection in the adjacent right lobe of the  liver, hepatic segment V, consistent with abscess or biloma.   Assessment and Plan: This is a 58 y.o. female with a history of acute cholecystitis and liver abscess requiring percutaneous cholecystostomy tube placement.  Discussed the patient's CT scan with Dr. Lavona Mound with interventional radiology and he notes that this remaining abscess is too small to try to place a new drainage catheter.  At this time there is no significant change or maybe only slight decrease in size and I do not think that the drain that she has is currently helping as much as it used to before.  As such, I think the patient would be ready now for a cholecystectomy.  Discussed with the patient the risks of bleeding, infection, and injury to surrounding structures.  Plan will be for a laparoscopic cholecystectomy with the chance for potential having to convert to open.  We will remove the percutaneous drain at that point but there is a possibility that she may require a new drain a we encountered the remaining  abscess and are able to drain it.  She has a history of MI and a recent pacemaker placement on 2/21 and is taking a full aspirin for this.  We will stop her aspirin today for preparation for surgery on 02/02/2019.  Face-to-face time spent with the patient and care providers was 25 minutes, with more than 50% of the time spent counseling, educating, and coordinating care of the patient.     Melvyn Neth, [facility]

## 2019-01-29 LAB — NOVEL CORONAVIRUS, NAA (HOSP ORDER, SEND-OUT TO REF LAB; TAT 18-24 HRS): SARS-CoV-2, NAA: NOT DETECTED

## 2019-02-01 ENCOUNTER — Other Ambulatory Visit: Payer: Self-pay

## 2019-02-01 ENCOUNTER — Encounter
Admission: RE | Admit: 2019-02-01 | Discharge: 2019-02-01 | Disposition: A | Discharge status: Home / Self Care | Payer: BC Managed Care – PPO | Source: Ambulatory Visit | Attending: Surgery | Admitting: Surgery

## 2019-02-01 HISTORY — DX: Family history of other specified conditions: Z84.89

## 2019-02-01 HISTORY — DX: Bronchitis, not specified as acute or chronic: J40

## 2019-02-01 MED ORDER — CEFAZOLIN SODIUM-DEXTROSE 2-4 GM/100ML-% IV SOLN
2.0000 g | INTRAVENOUS | Status: AC
  Administered 2019-02-02: 2 g via INTRAVENOUS

## 2019-02-01 NOTE — Pre-Procedure Instructions (Signed)
Messaged Dr Randa Lynn about need for cardiac clearance since last Echo showed EF of 25%-Dr Penwarden said last cardiology note from 10-2018 is sufficient enough. AICD form faxed over to Dr Saralyn Pilar to fill out for surgery tomorrow

## 2019-02-01 NOTE — Patient Instructions (Signed)
Your procedure is scheduled on: 02-02-19 Report to Same Day Surgery 2nd floor medical mall [facility] Entrance-take elevator on left to 2nd floor.  Check in with surgery information desk.) To find out your arrival time please call [phone] between 1PM - 3PM on 02-01-19   Remember: Instructions that are not followed completely may result in serious medical risk, up to and including death, or upon the discretion of your surgeon and anesthesiologist your surgery may need to be rescheduled.    _x___ 1. Do not eat food after midnight the night before your procedure. NO GUM OR CANDY AFTER MIDNIGHT. You may drink clear liquids up to 2 hours before you are scheduled to arrive at the hospital for your procedure.  Do not drink clear liquids within 2 hours of your scheduled arrival to the hospital.  Clear liquids include  --Water or Apple juice without pulp  --Clear carbohydrate beverage such as ClearFast or Gatorade  --Black Coffee or Clear Tea (No milk, no creamers, do not add anything to the coffee or Tea   ____Ensure clear carbohydrate drink on the way to the hospital for bariatric patients  ____Ensure clear carbohydrate drink 3 hours before surgery for Dr Dwyane Luo patients if physician instructed.    __x__ 2. No Alcohol for 24 hours before or after surgery.   __x__3. No Smoking or e-cigarettes for 24 prior to surgery.  Do not use any chewable tobacco products for at least 6 hour prior to surgery   ____  4. Bring all medications with you on the day of surgery if instructed.    __x__ 5. Notify your doctor if there is any change in your medical condition     (cold, fever, infections).    x___6. On the morning of surgery brush your teeth with toothpaste and water.  You may rinse your mouth with mouth wash if you wish.  Do not swallow any toothpaste or mouthwash.   Do not wear jewelry, make-up, hairpins, clips or nail polish.  Do not wear lotions, powders, or perfumes. You may wear  deodorant.  Do not shave 48 hours prior to surgery. Men may shave face and neck.  Do not bring valuables to the hospital.    [facility] is not responsible for any belongings or valuables.               Contacts, dentures or bridgework may not be worn into surgery.  Leave your suitcase in the car. After surgery it may be brought to your room.  For patients admitted to the hospital, discharge time is determined by your treatment team.  _  Patients discharged the day of surgery will not be allowed to drive home.  You will need someone to drive you home and stay with you the night of your procedure.    Please read over the following fact sheets that you were given:   [facility] Preparing for Surgery   _x___ TAKE THE FOLLOWING MEDICATION THE MORNING OF SURGERY WITH A SMALL SIP OF WATER. These include:  1. CARVEDILOL  2. PROTONIX  3. TAKE AN EXTRA PROTONIX TONIGHT BEFORE BED  4.  5.  6.  ____Fleets enema or Magnesium Citrate as directed.   _x___ Use CHG Soap or sage wipes as directed on instruction sheet   ____ Use inhalers on the day of surgery and bring to hospital day of surgery  ____ Stop Metformin and Janumet 2 days prior to surgery.    ____ Take 1/2 of usual  insulin dose the night before surgery and none on the morning surgery.   ____ Follow recommendations from Cardiologist, Pulmonologist or PCP regarding  stopping Aspirin, Coumadin, Plavix ,Eliquis, Effient, or Pradaxa, and Pletal-PT WAS INSTRUCTED BY OFFICE TO STOP ASA ON 01-28-19  X____Stop Anti-inflammatories such as Advil, Aleve, Ibuprofen, Motrin, Naproxen, Naprosyn, Goodies powders or aspirin products NOW-OK to take Tylenol    ____ Stop supplements until after surgery.     ____ Bring C-Pap to the hospital.

## 2019-02-01 NOTE — Pre-Procedure Instructions (Signed)
Progress Notes - documented in this encounter Erika Cowman, MD - 11/05/2018 9:15 AM EST Formatting of this note might be different from the original. Established Patient Visit   Chief Complaint: Chief Complaint  Patient presents with  . Follow-up  3 months and implant follow up  Date of Service: 11/05/2018 Date of Birth: February 04, 1961 PCP: Azzie Glatter, MD  History of Present Illness: Erika Rios is a 58 y.o.female patient who returns for  1. Anterior STEMI, DES proximal LAD 02/24/2016 2. Ischemic cardiomyopathy 3. COPD / quit smoking 02/24/2016 4. Pericarditis 5. Statin intolerance 6. ICD 10/22/2018  The patient has known ischemic cardiomyopathy. 2D echocardiogram 04/02/2016 revealed LV ejection fraction of 40-45% with anterior, apical and septal wall akinesis. 2D echocardiogram 02/26/2018 revealed LVEF of 30%. Repeat 2D echocardiogram 08/16/2018 revealed persistent severe reduced left ventricular function, with LVEF of 25%. She underwent elective ICD on 10/22/2018 at [facility]. She was admitted 11/01/2018 with acute cholecystitis and liver abscess, receive percutaneous cholecystostomy tube, scheduled for possible surgery in 4 to 6 weeks. She returns today, denies chest pain or shortness of breath. She denies palpitations or heart racing. She denies peripheral edema.  The patient has hyperlipidemia, LDL cholesterol 76 on 05/26/18, on atorvastatin, which she now takes 40 mg every other day due to statin intolerance. The patient reports persistent pain in her joints due to atorvastatin. The patient follows a low-cholesterol, low-fat diet.  Past Medical and Surgical History  Past Medical History Past Medical History:  Diagnosis Date  . Cardiomyopathy, secondary (CMS-HCC)  . COPD (chronic obstructive pulmonary disease) (CMS-HCC)  . Coronary artery disease  . Heart attack (CMS-HCC)  . History of tobacco abuse  . Obesity   Past Surgical History She has a past surgical history that  includes heart stint and right breast biopsy (2018).   Medications and Allergies  Current Medications  Current Outpatient Medications  Medication Sig Dispense Refill  . acetaminophen (TYLENOL) 500 MG tablet Take 1,000 mg by mouth as needed  . albuterol 90 mcg/actuation inhaler Inhale 2 inhalations into the lungs every 6 (six) hours as needed for Wheezing 8.5 g 3  . amoxicillin-clavulanate (AUGMENTIN) 875-125 mg tablet Take by mouth  . aspirin 325 MG EC tablet Take 325 mg by mouth once daily 0  . atorvastatin (LIPITOR) 20 MG tablet Take 1 tablet (20 mg total) by mouth once daily 30 tablet 11  . carvedilol (COREG) 3.125 MG tablet Take 1 tablet (3.125 mg total) by mouth 2 (two) times daily with meals 180 tablet 3  . cyanocobalamin, vitamin B-12, 5,000 mcg Cap Take 5,000 mcg by mouth once daily  . dicyclomine (BENTYL) 10 mg capsule Take 1 capsule (10 mg total) by mouth 2 (two) times daily as needed for up to 365 doses 30 capsule 2  . lisinopril (ZESTRIL) 2.5 MG tablet TAKE 1 TABLET (2.5 MG TOTAL) BY MOUTH ONCE DAILY. 90 tablet 1  . nitroGLYcerin (NITROSTAT) 0.4 MG SL tablet PLACE 1 TABLET UNDER TONGUE EVERY 5 MINUTES IF NEEDED FOR CHEST PAIN UP TO 3 DOSES 12  . ondansetron (ZOFRAN-ODT) 4 MG disintegrating tablet Take 1 tablet by mouth every 4 (four) hours as needed for Nausea & Vomiting  . oxyCODONE-acetaminophen (PERCOCET) 5-325 mg tablet Take by mouth Take 1-2 tablets by mouth every 4 (four) hours as needed for severe pain.  . pantoprazole (PROTONIX) 40 MG DR tablet TAKE 1 TABLET (40 MG TOTAL) BY MOUTH ONCE DAILY. 90 tablet 1  . potassium gluconate 2.5 mEq Tab Take 198  mg by mouth once daily  . sodium chloride 0.9% flush injection Flush drain with 35ml normal saline once daily. Do not aspirate  . spironolactone (ALDACTONE) 25 MG tablet TAKE 0.5 TABLET BY MOUTH ONCE DAILY. 45 tablet 1  . traMADol (ULTRAM) 50 mg tablet Take 50 mg by mouth every 6 (six) hours as needed.   . triamcinolone 0.1 %  cream Apply topically 2 (two) times daily Apply to rash twice daily. 30 g 3   No current facility-administered medications for this visit.   Allergies: Latex; Morphine; Oxycodone; and Zinc  Social and Family History  Social History reports that she has quit smoking. She has never used smokeless tobacco. She reports current alcohol use. Drug use questions deferred to the physician.  Family History History reviewed. No pertinent family history.  Review of Systems   Review of Systems: The patient denies chest pain, reports exertional shortness of breath, without orthopnea, paroxysmal nocturnal dyspnea, pedal edema, palpitations, heart racing, presyncope, syncope, with intermittent LLQ abdominal pain. Review of 12 Systems is negative except as described above.  Physical Examination   Vitals:BP 124/78  Pulse 65  Ht 160 cm (5\' 3" )  Wt 90.9 kg (200 lb 6.4 oz)  LMP (LMP Unknown)  SpO2 98%  BMI 35.50 kg/m  Ht:160 cm (5\' 3" ) Wt:90.9 kg (200 lb 6.4 oz) BPZ:WCHE surface area is 2.01 meters squared. Body mass index is 35.5 kg/m.  General: Alert and oriented. Well-appearing. No acute distress. HEENT: Pupils equally reactive to light and accomodation  Neck: Supple, no JVD Lungs: Normal effort of breathing; clear to auscultation bilaterally; no wheezes, rales, rhonchi Heart: Regular rate and rhythm. No murmur, rub, or gallop Abdomen: nondistended, with normal bowel sounds Extremities: no edema Peripheral Pulses: 2+ radial Skin: Warm, dry, no diaphoresis  Assessment   58 y.o. female with  1. Coronary artery disease involving native coronary artery of native heart without angina pectoris  2. Cardiomyopathy, ischemic  3. ST elevation myocardial infarction involving left anterior descending (LAD) coronary artery (CMS-HCC)  4. Chronic systolic HF (heart failure) (CMS-HCC)  5. COPD, mild , unspecified (CMS-HCC)  6. Acute cholecystitis  7. SOB (shortness of breath) on exertion  8. AICD  (automatic cardioverter/defibrillator) present   58 year old female with history of anterior STEMI, DES proximal LAD, without recurrent chest pain. 2D echocardiogram revealed LV ejection fraction at 30%. Carvedilol was uptitrated to 6.25 mg twice daily, with apparent side effects, with improvement in her symptoms when the dose was reduced to 3.125 mg twice daily. 2D echocardiogram was recently repeated, which revealed persistently low LV ejection fraction of 25%. The patient underwent elective ICD. She was recently hospitalized with acute cholecystitis and liver abscess status post percutaneous cholecystectomy tube awaiting definitive surgery in 4 to 6 weeks. The patient has hyperlipidemia on atorvastatin, with symptoms consistent with statin intolerance, with minimal improvement after taking atorvastatin at a lower dose every other day.   Plan   1. Continue current medications 2. Counseled patient about low-sodium diet 3. DASH diet printed instructions given to the patient 4. Counseled patient about low-cholesterol diet 5. Continue atorvastatin for hyperlipidemia management 6. Low-fat and cholesterol diet printed instructions given to the patient 7. Return to clinic for follow-up in 4 months  No orders of the defined types were placed in this encounter.  Return in about 4 months (around 03/07/2019).  Erika Cowman, MD PhD [facility]  Electronically signed by Erika Cowman, MD at 11/05/2018 10:30 AM EST

## 2019-02-01 NOTE — Pre-Procedure Instructions (Signed)
Echo complete12/17/2019 Erika Rios Ref Range  LV Ejection Fraction (%) 25   Aortic Valve Stenosis Grade none   Aortic Valve Regurgitation Grade none   Aortic Valve Max Velocity (m/s) 1.4 m/sec  Mitral Valve Stenosis Grade none   Mitral Valve Regurgitation Grade mild   Tricuspid Valve Regurgitation Grade mild   Tricuspid Valve Regurgitation Max Velocity (m/s) 2.2 m/sec  Right Ventricle Systolic Pressure (mmHg) 18.8 mmHg  LV End Diastolic Diameter (cm) 5.3 cm  LV End Systolic Diameter (cm) 4.6 cm  LV Septum Wall Thickness (cm) 1.1 cm  LV Posterior Wall Thickness (cm) 1 cm  Left Atrium Diameter (cm) 3.8 cm  Result Narrative             CARDIOLOGY DEPARTMENT          Erika Rios, Erika Rios                  Erika Rios      [name]              Acct #: [id number]      Erika Rios, Erika Rios, Erika Rios    Date: 08/16/2018 02: 42 PM                                Adult  Female  Age: 58 yrs      ECHOCARDIOGRAM REPORT               Outpatient                                [facility]    STUDY:CHEST WALL        TAPE:          Erika Rios, Erika Rios    ECHO:Yes  DOPPLER:Yes    FILE:          BP: 104/60 mmHg    COLOR:Yes  CONTRAST:No   MACHINE:Philips  RV BIOPSY:No     3D:No SOUND QLTY:Moderate      Height: 63 in   MEDIUM:None                       Weight: 214 lb                                BSA: 2.0 m2 _________________________________________________________________________________________        HISTORY: DOE         REASON: Assess, LV function       INDICATION: SOB (shortness of breath) on exertion [R06.02  (ICD-10-CM)] _________________________________________________________________________________________ ECHOCARDIOGRAPHIC MEASUREMENTS 2D DIMENSIONS AORTA         Values  Normal Range  MAIN PA     Values  Normal Range        Annulus: 1.8 cm    [2.1-2.5]     PA Main: nm*    [1.5-2.1]       Aorta Sin: 2.9 cm    [2.7-3.3]  RIGHT VENTRICLE      ST Junction: nm*     [2.3-2.9]     RV Base: nm*    [<4.2]       Asc.Aorta: nm*     [2.3-3.1]     RV Mid: 3.0 cm  [<3.5]  LEFT VENTRICLE                   RV Length: nm*    [<8.6]         LVIDd: 5.3 cm    [3.9-5.3]  INFERIOR VENA CAVA         LVIDs: 4.6 cm            Max. IVC: nm*    [<=2.1]           FS: 13.8 %    [>25]      Min. IVC: nm*          SWT: 1.1 cm    [0.5-0.9]  ------------------          PWT: 1.0 cm    [0.5-0.9]  nm* - not measured LEFT ATRIUM        LA Diam: 3.8 cm    [2.7-3.8]      LA A4C Area: nm*     [<20]       LA Volume: nm*     [22-52] _________________________________________________________________________________________ ECHOCARDIOGRAPHIC DESCRIPTIONS AORTIC ROOT          Size: Normal       Dissection: INDETERM FOR DISSECTION AORTIC VALVE        Leaflets: Tricuspid          Morphology: Normal        Mobility: Fully mobile LEFT VENTRICLE          Size: Normal            Anterior: HYPOCONTRACTILE      Contraction: REGIONALLY IMPAIRED      Lateral: HYPOCONTRACTILE       Closest EF: 25% (Estimated)         Septal: AKINETIC       LV Masses: No Masses            Apical: AKINETIC          LVH: None             Inferior: AKINETIC                           Posterior: HYPOCONTRACTILE       Dias.FxClass: N/A MITRAL VALVE        Leaflets: Normal            Mobility: Fully mobile       Morphology: THICKENED LEAFLET(S) LEFT ATRIUM          Size: Normal            LA Masses: No masses       IA Septum: Normal IAS MAIN PA          Size: Normal PULMONIC VALVE       Morphology: Normal            Mobility: Fully mobile RIGHT VENTRICLE       RV Masses: No Masses             Size: Normal       Free Wall: Normal           Contraction: Normal TRICUSPID VALVE        Leaflets: Normal            Mobility: Fully mobile       Morphology: Normal RIGHT ATRIUM          Size: Normal  RA Other: None        RA Mass: No masses PERICARDIUM         Fluid: No effusion INFERIOR VENACAVA          Size: Normal Normal respiratory collapse _________________________________________________________________________________________  DOPPLER ECHO and OTHER SPECIAL PROCEDURES         Aortic: No AR           No AS             135.7 cm/sec peak vel   7.4 mmHg peak grad         Mitral: MILD MR          No MS             3.3 cm^2 by DOPPLER             MV Inflow E Vel = 71.3 cm/sec   MV Annulus E'Vel = 4.1 cm/sec             E/E'Ratio = 17.4       Tricuspid: MILD TR          No TS             220.2 cm/sec peak TR vel  24.4 mmHg peak RV pressure       Pulmonary: No PR           No PS             90.9 cm/sec peak vel    3.3 mmHg peak grad _________________________________________________________________________________________ INTERPRETATION SEVERE LV SYSTOLIC DYSFUNCTION (See above) NORMAL RIGHT VENTRICULAR SYSTOLIC FUNCTION MILD VALVULAR REGURGITATION (See above) NO VALVULAR  STENOSIS MILD MR, TR EF 25% _________________________________________________________________________________________ Electronically signed by   MD Erika Rios on 08/17/2018 05: 55 PM      Performed By: Erika Rios   Ordering Physician: Erika Rios _________________________________________________________________________________________  Other Result Information  Interface, Text Results In - 08/17/2018  5:55 PM EST                      CARDIOLOGY DEPARTMENT                   Erika Rios, Erika Rios                                    Erika Rios           [name]                           Acct #: [id number]           Erika Rios, Erika Rios [id number]       Date: 08/16/2018 02: 05 PM                                                              Adult   Female   Age: 58 yrs           ECHOCARDIOGRAM REPORT  Outpatient                                                              [facility]      STUDY:CHEST WALL               TAPE:                    Erika Rios, Erika Rios       ECHO:Yes    DOPPLER:Yes       FILE:                    BP: 104/60 mmHg      COLOR:Yes   CONTRAST:No     MACHINE:Philips  RV BIOPSY:No          3D:No  SOUND QLTY:Moderate            Height: 63 in     MEDIUM:None                                              Weight: 214 lb                                                              BSA: 2.0 m2 _________________________________________________________________________________________               HISTORY: DOE                REASON: Assess, LV function            INDICATION: SOB (shortness of breath) on exertion [R06.02 (ICD-10-CM)] _________________________________________________________________________________________ ECHOCARDIOGRAPHIC MEASUREMENTS 2D DIMENSIONS AORTA                  Values   Normal Range   MAIN PA         Values    Normal Range               Annulus: 1.8 cm        [2.1-2.5]         PA Main: nm*       [1.5-2.1]             Aorta Sin: 2.9 cm       [2.7-3.3]    RIGHT VENTRICLE           ST Junction: nm*          [2.3-2.9]         RV Base: nm*       [<4.2]             Asc.Aorta: nm*          [2.3-3.1]          RV Mid: 3.0 cm    [<3.5] LEFT VENTRICLE  RV Length: nm*       [<8.6]                 LVIDd: 5.3 cm       [3.9-5.3]    INFERIOR VENA CAVA                 LVIDs: 4.6 cm                        Max. IVC: nm*       [<=2.1]                    FS: 13.8 %       [>25]            Min. IVC: nm*                   SWT: 1.1 cm       [0.5-0.9]    ------------------                   PWT: 1.0 cm       [0.5-0.9]    nm* - not measured LEFT ATRIUM               LA Diam: 3.8 cm       [2.7-3.8]           LA A4C Area: nm*          [<20]             LA Volume: nm*          [22-52] _________________________________________________________________________________________ ECHOCARDIOGRAPHIC DESCRIPTIONS AORTIC ROOT                  Size: Normal            Dissection: INDETERM FOR DISSECTION AORTIC VALVE              Leaflets: Tricuspid                   Morphology: Normal              Mobility: Fully mobile LEFT VENTRICLE                  Size: Normal                        Anterior: HYPOCONTRACTILE           Contraction: REGIONALLY IMPAIRED            Lateral: HYPOCONTRACTILE            Closest EF: 25% (Estimated)                 Septal: AKINETIC             LV Masses: No Masses                       Apical: AKINETIC                   LVH: None                          Inferior: AKINETIC  Posterior: HYPOCONTRACTILE          Dias.FxClass: N/A MITRAL VALVE              Leaflets: Normal                        Mobility: Fully mobile            Morphology: THICKENED LEAFLET(S) LEFT ATRIUM                  Size: Normal                       LA Masses: No masses             IA Septum:  Normal IAS MAIN PA                  Size: Normal PULMONIC VALVE            Morphology: Normal                        Mobility: Fully mobile RIGHT VENTRICLE             RV Masses: No Masses                         Size: Normal             Free Wall: Normal                     Contraction: Normal TRICUSPID VALVE              Leaflets: Normal                        Mobility: Fully mobile            Morphology: Normal RIGHT ATRIUM                  Size: Normal                        RA Other: None               RA Mass: No masses PERICARDIUM                 Fluid: No effusion INFERIOR VENACAVA                  Size: Normal Normal respiratory collapse _________________________________________________________________________________________  DOPPLER ECHO and OTHER SPECIAL PROCEDURES                Aortic: No AR                      No AS                        135.7 cm/sec peak vel      7.4 mmHg peak grad                Mitral: MILD MR                    No MS                        3.3 cm^2 by DOPPLER  MV Inflow E Vel = 71.3 cm/sec     MV Annulus E'Vel = 4.1 cm/sec                        E/E'Ratio = 17.4             Tricuspid: MILD TR                    No TS                        220.2 cm/sec peak TR vel   24.4 mmHg peak RV pressure             Pulmonary: No PR                      No PS                        90.9 cm/sec peak vel       3.3 mmHg peak grad _________________________________________________________________________________________ INTERPRETATION SEVERE LV SYSTOLIC DYSFUNCTION (See above) NORMAL RIGHT VENTRICULAR SYSTOLIC FUNCTION MILD VALVULAR REGURGITATION (See above) NO VALVULAR STENOSIS MILD MR, TR EF 25% _________________________________________________________________________________________ Electronically signed by      MD Erika Rios on 08/17/2018 05: 95 PM          Performed By: Erika Rios    Ordering Physician: Erika Rios _________________________________________________________________________________________  Status Results Details   Encounter Summary

## 2019-02-01 NOTE — Pre-Procedure Instructions (Signed)
AICD FORM FILLED OUT BY DR PARASCHOS AND PLACED ON PTS CHART

## 2019-02-02 ENCOUNTER — Ambulatory Visit: Payer: BC Managed Care – PPO | Admitting: Anesthesiology

## 2019-02-02 ENCOUNTER — Ambulatory Visit
Admission: RE | Admit: 2019-02-02 | Discharge: 2019-02-02 | Disposition: A | Discharge status: Home / Self Care | Payer: BC Managed Care – PPO | Attending: Surgery | Admitting: Surgery

## 2019-02-02 ENCOUNTER — Encounter
Admission: RE | Disposition: A | Discharge status: Home / Self Care | Payer: Self-pay | Source: Home / Self Care | Attending: Surgery

## 2019-02-02 ENCOUNTER — Other Ambulatory Visit: Payer: Self-pay

## 2019-02-02 ENCOUNTER — Encounter: Payer: Self-pay | Admitting: *Deleted

## 2019-02-02 DIAGNOSIS — Z7982 Long term (current) use of aspirin: Secondary | ICD-10-CM | POA: Insufficient documentation

## 2019-02-02 DIAGNOSIS — K8020 Calculus of gallbladder without cholecystitis without obstruction: Secondary | ICD-10-CM | POA: Diagnosis present

## 2019-02-02 DIAGNOSIS — Z79899 Other long term (current) drug therapy: Secondary | ICD-10-CM | POA: Insufficient documentation

## 2019-02-02 DIAGNOSIS — Z87891 Personal history of nicotine dependence: Secondary | ICD-10-CM | POA: Diagnosis not present

## 2019-02-02 DIAGNOSIS — I252 Old myocardial infarction: Secondary | ICD-10-CM | POA: Insufficient documentation

## 2019-02-02 DIAGNOSIS — Z888 Allergy status to other drugs, medicaments and biological substances status: Secondary | ICD-10-CM | POA: Insufficient documentation

## 2019-02-02 DIAGNOSIS — K219 Gastro-esophageal reflux disease without esophagitis: Secondary | ICD-10-CM | POA: Insufficient documentation

## 2019-02-02 DIAGNOSIS — K750 Abscess of liver: Secondary | ICD-10-CM | POA: Insufficient documentation

## 2019-02-02 DIAGNOSIS — Z885 Allergy status to narcotic agent status: Secondary | ICD-10-CM | POA: Diagnosis not present

## 2019-02-02 DIAGNOSIS — K810 Acute cholecystitis: Secondary | ICD-10-CM

## 2019-02-02 DIAGNOSIS — Z889 Allergy status to unspecified drugs, medicaments and biological substances status: Secondary | ICD-10-CM | POA: Diagnosis not present

## 2019-02-02 DIAGNOSIS — Z91040 Latex allergy status: Secondary | ICD-10-CM | POA: Insufficient documentation

## 2019-02-02 DIAGNOSIS — I509 Heart failure, unspecified: Secondary | ICD-10-CM | POA: Insufficient documentation

## 2019-02-02 DIAGNOSIS — K8012 Calculus of gallbladder with acute and chronic cholecystitis without obstruction: Secondary | ICD-10-CM | POA: Diagnosis not present

## 2019-02-02 DIAGNOSIS — J449 Chronic obstructive pulmonary disease, unspecified: Secondary | ICD-10-CM | POA: Diagnosis not present

## 2019-02-02 DIAGNOSIS — K828 Other specified diseases of gallbladder: Secondary | ICD-10-CM | POA: Insufficient documentation

## 2019-02-02 HISTORY — PX: CHOLECYSTECTOMY: SHX55

## 2019-02-02 LAB — CBC WITH DIFFERENTIAL/PLATELET
Abs Immature Granulocytes: 0.03 10*3/uL (ref 0.00–0.07)
Basophils Absolute: 0.1 10*3/uL (ref 0.0–0.1)
Basophils Relative: 1 %
Eosinophils Absolute: 0.4 10*3/uL (ref 0.0–0.5)
Eosinophils Relative: 5 %
HCT: 37.8 % (ref 36.0–46.0)
Hemoglobin: 12.7 g/dL (ref 12.0–15.0)
Immature Granulocytes: 0 %
Lymphocytes Relative: 25 %
Lymphs Abs: 2.3 10*3/uL (ref 0.7–4.0)
MCH: 33.2 pg (ref 26.0–34.0)
MCHC: 33.6 g/dL (ref 30.0–36.0)
MCV: 99 fL (ref 80.0–100.0)
Monocytes Absolute: 1 10*3/uL (ref 0.1–1.0)
Monocytes Relative: 11 %
Neutro Abs: 5.2 10*3/uL (ref 1.7–7.7)
Neutrophils Relative %: 58 %
Platelets: 240 10*3/uL (ref 150–400)
RBC: 3.82 MIL/uL — ABNORMAL LOW (ref 3.87–5.11)
RDW: 12.8 % (ref 11.5–15.5)
WBC: 9 10*3/uL (ref 4.0–10.5)
nRBC: 0 % (ref 0.0–0.2)

## 2019-02-02 LAB — COMPREHENSIVE METABOLIC PANEL
ALT: 25 U/L (ref 0–44)
AST: 20 U/L (ref 15–41)
Albumin: 3.7 g/dL (ref 3.5–5.0)
Alkaline Phosphatase: 88 U/L (ref 38–126)
Anion gap: 8 (ref 5–15)
BUN: 13 mg/dL (ref 6–20)
CO2: 21 mmol/L — ABNORMAL LOW (ref 22–32)
Calcium: 8.8 mg/dL — ABNORMAL LOW (ref 8.9–10.3)
Chloride: 108 mmol/L (ref 98–111)
Creatinine, Ser: 0.7 mg/dL (ref 0.44–1.00)
GFR calc Af Amer: >60 mL/min (ref >60)
GFR calc non Af Amer: >60 mL/min (ref >60)
Glucose, Bld: 96 mg/dL (ref 70–99)
Potassium: 3.8 mmol/L (ref 3.5–5.1)
Sodium: 137 mmol/L (ref 135–145)
Total Bilirubin: 0.5 mg/dL (ref 0.3–1.2)
Total Protein: 6.8 g/dL (ref 6.5–8.1)

## 2019-02-02 LAB — PROTIME-INR
INR: 0.9 (ref 0.8–1.2)
Prothrombin Time: 12.4 seconds (ref 11.4–15.2)

## 2019-02-02 SURGERY — LAPAROSCOPIC CHOLECYSTECTOMY
Anesthesia: General

## 2019-02-02 MED ORDER — MIDAZOLAM HCL 2 MG/2ML IJ SOLN
INTRAMUSCULAR | Status: AC
  Filled 2019-02-02: qty 2

## 2019-02-02 MED ORDER — ACETAMINOPHEN 500 MG PO TABS
1000.0000 mg | ORAL_TABLET | ORAL | Status: AC
  Administered 2019-02-02: 1000 mg via ORAL

## 2019-02-02 MED ORDER — PHENYLEPHRINE HCL (PRESSORS) 10 MG/ML IV SOLN
INTRAVENOUS | Status: DC | PRN
  Administered 2019-02-02 (×2): 100 ug via INTRAVENOUS

## 2019-02-02 MED ORDER — DEXAMETHASONE SODIUM PHOSPHATE 10 MG/ML IJ SOLN
INTRAMUSCULAR | Status: DC | PRN
  Administered 2019-02-02: 8 mg via INTRAVENOUS

## 2019-02-02 MED ORDER — PROPOFOL 10 MG/ML IV BOLUS
INTRAVENOUS | Status: AC
  Filled 2019-02-02: qty 20

## 2019-02-02 MED ORDER — SUCCINYLCHOLINE CHLORIDE 20 MG/ML IJ SOLN
INTRAMUSCULAR | Status: AC
  Filled 2019-02-02: qty 1

## 2019-02-02 MED ORDER — LIDOCAINE HCL (CARDIAC) PF 100 MG/5ML IV SOSY
PREFILLED_SYRINGE | INTRAVENOUS | Status: DC | PRN
  Administered 2019-02-02: 100 mg via INTRAVENOUS

## 2019-02-02 MED ORDER — LIDOCAINE HCL (PF) 2 % IJ SOLN
INTRAMUSCULAR | Status: AC
  Filled 2019-02-02: qty 10

## 2019-02-02 MED ORDER — SUGAMMADEX SODIUM 200 MG/2ML IV SOLN
INTRAVENOUS | Status: DC | PRN
  Administered 2019-02-02: 200 mg via INTRAVENOUS

## 2019-02-02 MED ORDER — ACETAMINOPHEN 500 MG PO TABS: 1000.0000 mg | ORAL_TABLET | Freq: Four times a day (QID) | ORAL | 0 refills | Status: AC | PRN

## 2019-02-02 MED ORDER — GABAPENTIN 300 MG PO CAPS
300.0000 mg | ORAL_CAPSULE | ORAL | Status: AC
  Administered 2019-02-02: 300 mg via ORAL

## 2019-02-02 MED ORDER — HYDROMORPHONE HCL 2 MG PO TABS
ORAL_TABLET | ORAL | Status: AC
  Administered 2019-02-02: 2 mg via ORAL
  Filled 2019-02-02: qty 1

## 2019-02-02 MED ORDER — MIDAZOLAM HCL 2 MG/2ML IJ SOLN
INTRAMUSCULAR | Status: DC | PRN
  Administered 2019-02-02: 2 mg via INTRAVENOUS

## 2019-02-02 MED ORDER — DEXAMETHASONE SODIUM PHOSPHATE 10 MG/ML IJ SOLN
INTRAMUSCULAR | Status: AC
  Filled 2019-02-02: qty 1

## 2019-02-02 MED ORDER — FENTANYL CITRATE (PF) 100 MCG/2ML IJ SOLN
INTRAMUSCULAR | Status: AC
  Filled 2019-02-02: qty 2

## 2019-02-02 MED ORDER — MORPHINE SULFATE (PF) 4 MG/ML IV SOLN
2.0000 mg | INTRAVENOUS | Status: DC | PRN
  Administered 2019-02-02 (×3): 2 mg via INTRAVENOUS

## 2019-02-02 MED ORDER — ROCURONIUM BROMIDE 100 MG/10ML IV SOLN
INTRAVENOUS | Status: DC | PRN
  Administered 2019-02-02: 10 mg via INTRAVENOUS
  Administered 2019-02-02: 40 mg via INTRAVENOUS
  Administered 2019-02-02: 10 mg via INTRAVENOUS

## 2019-02-02 MED ORDER — BUPIVACAINE-EPINEPHRINE (PF) 0.25% -1:200000 IJ SOLN
INTRAMUSCULAR | Status: DC | PRN
  Administered 2019-02-02: 30 mL

## 2019-02-02 MED ORDER — CHLORHEXIDINE GLUCONATE CLOTH 2 % EX PADS
6.0000 | MEDICATED_PAD | Freq: Once | CUTANEOUS | Status: AC
  Administered 2019-02-02: 6 via TOPICAL

## 2019-02-02 MED ORDER — SUCCINYLCHOLINE CHLORIDE 20 MG/ML IJ SOLN
INTRAMUSCULAR | Status: DC | PRN
  Administered 2019-02-02: 120 mg via INTRAVENOUS

## 2019-02-02 MED ORDER — GABAPENTIN 300 MG PO CAPS
ORAL_CAPSULE | ORAL | Status: AC
  Administered 2019-02-02: 300 mg via ORAL
  Filled 2019-02-02: qty 1

## 2019-02-02 MED ORDER — ACETAMINOPHEN 500 MG PO TABS
ORAL_TABLET | ORAL | Status: AC
  Administered 2019-02-02: 1000 mg via ORAL
  Filled 2019-02-02: qty 2

## 2019-02-02 MED ORDER — FENTANYL CITRATE (PF) 100 MCG/2ML IJ SOLN
INTRAMUSCULAR | Status: DC | PRN
  Administered 2019-02-02: 100 ug via INTRAVENOUS

## 2019-02-02 MED ORDER — MORPHINE SULFATE (PF) 4 MG/ML IV SOLN
INTRAVENOUS | Status: AC
  Administered 2019-02-02: 2 mg via INTRAVENOUS
  Filled 2019-02-02: qty 1

## 2019-02-02 MED ORDER — ROCURONIUM BROMIDE 50 MG/5ML IV SOLN
INTRAVENOUS | Status: AC
  Filled 2019-02-02: qty 1

## 2019-02-02 MED ORDER — CHLORHEXIDINE GLUCONATE CLOTH 2 % EX PADS: 6.0000 | MEDICATED_PAD | Freq: Once | CUTANEOUS | Status: DC

## 2019-02-02 MED ORDER — PROPOFOL 10 MG/ML IV BOLUS
INTRAVENOUS | Status: DC | PRN
  Administered 2019-02-02: 50 mg via INTRAVENOUS

## 2019-02-02 MED ORDER — OXYCODONE HCL 5 MG PO TABS: 5.0000 mg | ORAL_TABLET | Freq: Four times a day (QID) | ORAL | 0 refills | Status: DC | PRN

## 2019-02-02 MED ORDER — HYDROMORPHONE HCL 2 MG PO TABS
2.0000 mg | ORAL_TABLET | ORAL | Status: DC | PRN
  Administered 2019-02-02: 2 mg via ORAL
  Filled 2019-02-02 (×2): qty 1

## 2019-02-02 MED ORDER — LACTATED RINGERS IV SOLN
INTRAVENOUS | Status: DC
  Administered 2019-02-02: 12:00:00 via INTRAVENOUS

## 2019-02-02 MED ORDER — ONDANSETRON HCL 4 MG/2ML IJ SOLN
INTRAMUSCULAR | Status: AC
  Filled 2019-02-02: qty 2

## 2019-02-02 MED ORDER — HYDROMORPHONE HCL 2 MG PO TABS: 2.0000 mg | ORAL_TABLET | ORAL | 0 refills | Status: AC | PRN

## 2019-02-02 MED ORDER — ONDANSETRON HCL 4 MG/2ML IJ SOLN
INTRAMUSCULAR | Status: DC | PRN
  Administered 2019-02-02: 4 mg via INTRAVENOUS

## 2019-02-02 MED ORDER — SUGAMMADEX SODIUM 200 MG/2ML IV SOLN
INTRAVENOUS | Status: AC
  Filled 2019-02-02: qty 2

## 2019-02-02 SURGICAL SUPPLY — 49 items
APPLIER CLIP 5 13 M/L LIGAMAX5 (MISCELLANEOUS) ×2
BLADE SURG 15 STRL LF DISP TIS (BLADE) ×1 IMPLANT
BLADE SURG 15 STRL SS (BLADE) ×1
CANISTER SUCT 1200ML W/VALVE (MISCELLANEOUS) ×2 IMPLANT
CATH CHOLANGI 4FR 420404F (CATHETERS) IMPLANT
CHLORAPREP W/TINT 26 (MISCELLANEOUS) ×2 IMPLANT
CLIP APPLIE 5 13 M/L LIGAMAX5 (MISCELLANEOUS) ×1 IMPLANT
CONRAY 60ML FOR OR (MISCELLANEOUS) IMPLANT
COVER WAND RF STERILE (DRAPES) IMPLANT
DEFOGGER SCOPE WARMER CLEARIFY (MISCELLANEOUS) ×2 IMPLANT
DERMABOND ADVANCED (GAUZE/BANDAGES/DRESSINGS) ×1
DERMABOND ADVANCED .7 DNX12 (GAUZE/BANDAGES/DRESSINGS) ×1 IMPLANT
DRAPE C-ARM XRAY 36X54 (DRAPES) IMPLANT
DRSG TEGADERM 2-3/8X2-3/4 SM (GAUZE/BANDAGES/DRESSINGS) ×2 IMPLANT
ELECT CAUTERY BLADE TIP 2.5 (TIP) ×2
ELECT REM PT RETURN 9FT ADLT (ELECTROSURGICAL) ×2
ELECTRODE CAUTERY BLDE TIP 2.5 (TIP) ×1 IMPLANT
ELECTRODE REM PT RTRN 9FT ADLT (ELECTROSURGICAL) ×1 IMPLANT
GLOVE SURG SYN 7.0 (GLOVE) ×2 IMPLANT
GLOVE SURG SYN 7.5  E (GLOVE) ×1
GLOVE SURG SYN 7.5 E (GLOVE) ×1 IMPLANT
GOWN STRL REUS W/ TWL LRG LVL3 (GOWN DISPOSABLE) ×3 IMPLANT
GOWN STRL REUS W/TWL LRG LVL3 (GOWN DISPOSABLE) ×3
IRRIGATION STRYKERFLOW (MISCELLANEOUS) ×1 IMPLANT
IRRIGATOR STRYKERFLOW (MISCELLANEOUS) ×2
IV CATH ANGIO 12GX3 LT BLUE (NEEDLE) IMPLANT
IV NS 1000ML (IV SOLUTION)
IV NS 1000ML BAXH (IV SOLUTION) IMPLANT
JACKSON PRATT 10 (INSTRUMENTS) IMPLANT
L-HOOK LAP DISP 36CM (ELECTROSURGICAL) ×2
LABEL OR SOLS (LABEL) ×2 IMPLANT
LHOOK LAP DISP 36CM (ELECTROSURGICAL) ×1 IMPLANT
NEEDLE HYPO 22GX1.5 SAFETY (NEEDLE) ×4 IMPLANT
PACK LAP CHOLECYSTECTOMY (MISCELLANEOUS) ×2 IMPLANT
PENCIL ELECTRO HAND CTR (MISCELLANEOUS) ×2 IMPLANT
POUCH SPECIMEN RETRIEVAL 10MM (ENDOMECHANICALS) ×2 IMPLANT
SCISSORS METZENBAUM CVD 33 (INSTRUMENTS) ×2 IMPLANT
SET TUBE SMOKE EVAC HIGH FLOW (TUBING) ×2 IMPLANT
SLEEVE ADV FIXATION 5X100MM (TROCAR) ×6 IMPLANT
SPONGE GAUZE 2X2 8PLY STRL LF (GAUZE/BANDAGES/DRESSINGS) ×2 IMPLANT
SPONGE VERSALON 4X4 4PLY (MISCELLANEOUS) IMPLANT
SUT MNCRL 4-0 (SUTURE) ×1
SUT MNCRL 4-0 27XMFL (SUTURE) ×1
SUT VIC AB 3-0 SH 27 (SUTURE) ×1
SUT VIC AB 3-0 SH 27X BRD (SUTURE) ×1 IMPLANT
SUT VICRYL 0 AB UR-6 (SUTURE) ×2 IMPLANT
SUTURE MNCRL 4-0 27XMF (SUTURE) ×1 IMPLANT
TROCAR BALLN GELPORT 12X130M (ENDOMECHANICALS) ×2 IMPLANT
TROCAR Z-THREAD OPTICAL 5X100M (TROCAR) ×2 IMPLANT

## 2019-02-02 NOTE — Anesthesia Postprocedure Evaluation (Signed)
Anesthesia Post Note  Patient: Erika Rios  Procedure(s) Performed: LAPAROSCOPIC CHOLECYSTECTOMY - LATEX ALLERGY (N/A )  Patient location during evaluation: PACU Anesthesia Type: General Level of consciousness: awake and alert Pain management: pain level controlled Vital Signs Assessment: post-procedure vital signs reviewed and stable Respiratory status: spontaneous breathing, nonlabored ventilation, respiratory function stable and patient connected to nasal cannula oxygen Cardiovascular status: blood pressure returned to baseline and stable Postop Assessment: no apparent nausea or vomiting Anesthetic complications: no     Last Vitals:  Vitals:   02/02/19 1729 02/02/19 1749  BP: (!) 148/62 (!) 147/69  Pulse: 60 60  Resp:    Temp:  36.7 C  SpO2: 94% 94%    Last Pain:  Vitals:   02/02/19 1749  TempSrc:   PainSc: 3                  Martha Clan

## 2019-02-02 NOTE — Discharge Instructions (Signed)
Laparoscopic Cholecystectomy, Care After °This sheet gives you information about how to care for yourself after your procedure. Your doctor may also give you more specific instructions. If you have problems or questions, contact your doctor. °Follow these instructions at home: °Care for cuts from surgery (incisions) ° °· Follow instructions from your doctor about how to take care of your cuts from surgery. Make sure you: °? Wash your hands with soap and water before you change your bandage (dressing). If you cannot use soap and water, use hand sanitizer. °? Change your bandage as told by your doctor. °? Leave stitches (sutures), skin glue, or skin tape (adhesive) strips in place. They may need to stay in place for 2 weeks or longer. If tape strips get loose and curl up, you may trim the loose edges. Do not remove tape strips completely unless your doctor says it is okay. °· Do not take baths, swim, or use a hot tub until your doctor says it is okay. Ask your doctor if you can take showers. You may only be allowed to take sponge baths for bathing. °· Check your surgical cut area every day for signs of infection. Check for: °? More redness, swelling, or pain. °? More fluid or blood. °? Warmth. °? Pus or a bad smell. °Activity °· Do not drive or use heavy machinery while taking prescription pain medicine. °· Do not lift anything that is heavier than 10 lb (4.5 kg) until your doctor says it is okay. °· Do not play contact sports until your doctor says it is okay. °· Do not drive for 24 hours if you were given a medicine to help you relax (sedative). °· Rest as needed. Do not return to work or school until your doctor says it is okay. °General instructions °· Take over-the-counter and prescription medicines only as told by your doctor. °· To prevent or treat constipation while you are taking prescription pain medicine, your doctor may recommend that you: °? Drink enough fluid to keep your pee (urine) clear or pale  yellow. °? Take over-the-counter or prescription medicines. °? Eat foods that are high in fiber, such as fresh fruits and vegetables, whole grains, and beans. °? Limit foods that are high in fat and processed sugars, such as fried and sweet foods. °Contact a doctor if: °· You develop a rash. °· You have more redness, swelling, or pain around your surgical cuts. °· You have more fluid or blood coming from your surgical cuts. °· Your surgical cuts feel warm to the touch. °· You have pus or a bad smell coming from your surgical cuts. °· You have a fever. °· One or more of your surgical cuts breaks open. °Get help right away if: °· You have trouble breathing. °· You have chest pain. °· You have pain that is getting worse in your shoulders. °· You faint or feel dizzy when you stand. °· You have very bad pain in your belly (abdomen). °· You are sick to your stomach (nauseous) for more than one day. °· You have throwing up (vomiting) that lasts for more than one day. °· You have leg pain. °This information is not intended to replace advice given to you by your health care provider. Make sure you discuss any questions you have with your health care provider. °Document Released: 05/27/2008 Document Revised: 03/08/2016 Document Reviewed: 02/04/2016 °Elsevier Interactive Patient Education © 2019 [employer] ° °AMBULATORY SURGERY  °DISCHARGE INSTRUCTIONS ° ° °1) The drugs that you were given   will stay in your system until tomorrow so for the next 24 hours you should not:  A) Drive an automobile B) Make any legal decisions C) Drink any alcoholic beverage   2) You may resume regular meals tomorrow.  Today it is better to start with liquids and gradually work up to solid foods.  You may eat anything you prefer, but it is better to start with liquids, then soup and crackers, and gradually work up to solid foods.   3) Please notify your doctor immediately if you have any unusual bleeding, trouble breathing, redness and  pain at the surgery site, drainage, fever, or pain not relieved by medication. 4)   5) Your post-operative visit with Dr.                                     is: Date:                        Time:    Please call to schedule your post-operative visit.  6) Additional Instructions:

## 2019-02-02 NOTE — Anesthesia Post-op Follow-up Note (Signed)
Anesthesia QCDR form completed.        

## 2019-02-02 NOTE — Anesthesia Preprocedure Evaluation (Addendum)
Anesthesia Evaluation  Patient identified by MRN, date of birth, ID band Patient awake    Reviewed: Allergy & Precautions, H&P , NPO status , Patient's Chart, lab work & pertinent test results  Airway Mallampati: II  TM Distance: >3 FB     Dental  (+) Teeth Intact   Pulmonary shortness of breath and with exertion, COPD, former smoker,           Cardiovascular (-) angina+ CAD, + Past MI, +CHF and + DOE  + Cardiac Defibrillator   Echo 08/17/18: SEVERE LV SYSTOLIC DYSFUNCTION, EF 17% NORMAL RIGHT VENTRICULAR SYSTOLIC FUNCTION NO VALVULAR STENOSIS MILD MR, TR EF 25%   Neuro/Psych negative neurological ROS  negative psych ROS   GI/Hepatic Neg liver ROS, GERD  Controlled,  Endo/Other  negative endocrine ROS  Renal/GU      Musculoskeletal   Abdominal   Peds  Hematology negative hematology ROS (+)   Anesthesia Other Findings Past Medical History: 10/2018: AICD (automatic cardioverter/defibrillator) present No date: Bronchitis     Comment:  h/o No date: CHF (congestive heart failure) (HCC) No date: Family history of adverse reaction to anesthesia     Comment:  son hard to wake up No date: GERD (gastroesophageal reflux disease) 2017: ST elevation myocardial infarction (STEMI) of anterior wall,  initial episode of care ([place]) No date: Tobacco abuse     Comment:  quit smoking 2017  Past Surgical History: 12/10/2016: BREAST BIOPSY; Right     Comment:  chip remains in breast. benign 02/24/2016: CARDIAC CATHETERIZATION; N/A     Comment:  Procedure: Left Heart Cath and Coronary Angiography;                Surgeon: Troy Sine, MD;  Location: [facility];  Service: Cardiovascular;  Laterality: N/A; 02/24/2016: CARDIAC CATHETERIZATION; N/A     Comment:  Procedure: Coronary Stent Intervention;  Surgeon: Troy Sine, MD;  Location: [facility];  Service:   Cardiovascular;  Laterality: N/A; 10/22/2018: IMPLANTABLE CARDIOVERTER DEFIBRILLATOR (ICD) GENERATOR  CHANGE; Left     Comment:  Procedure: ICD GENERATOR INSERTION;  Surgeon: Marzetta Board, MD;  Location: [facility];  Service:               Cardiovascular;  Laterality: Left; No date: IR BILIARY DRAIN PLACEMENT WITH CHOLANGIOGRAM  BMI    Body Mass Index:  36.83 kg/m      Reproductive/Obstetrics negative OB ROS                            Anesthesia Physical Anesthesia Plan  ASA: IV  Anesthesia Plan: General ETT   Post-op Pain Management:    Induction:   PONV Risk Score and Plan: Ondansetron, Dexamethasone, Midazolam and Treatment may vary due to age or medical condition  Airway Management Planned:   Additional Equipment:   Intra-op Plan:   Post-operative Plan:   Informed Consent: I have reviewed the patients History and Physical, chart, labs and discussed the procedure including the risks, benefits and alternatives for the proposed anesthesia with the patient or authorized representative who has indicated his/her understanding and acceptance.     Dental Advisory Given  Plan Discussed with: Anesthesiologist and CRNA  Anesthesia Plan Comments:         Anesthesia Quick Evaluation

## 2019-02-02 NOTE — Anesthesia Procedure Notes (Signed)
Procedure Name: Intubation Date/Time: 02/02/2019 1:03 PM Performed by: Rona Ravens, CRNA Pre-anesthesia Checklist: Patient identified, Emergency Drugs available, Suction available and Patient being monitored Patient Re-evaluated:Patient Re-evaluated prior to induction Oxygen Delivery Method: Circle system utilized Preoxygenation: Pre-oxygenation with 100% oxygen Induction Type: IV induction and Rapid sequence Laryngoscope Size: Mac and 4 Grade View: Grade I Tube type: Oral Tube size: 7.0 mm Number of attempts: 1 Placement Confirmation: ETT inserted through vocal cords under direct vision,  breath sounds checked- equal and bilateral,  CO2 detector and positive ETCO2 Secured at: 20 cm Tube secured with: Tape Dental Injury: Teeth and Oropharynx as per pre-operative assessment

## 2019-02-02 NOTE — Transfer of Care (Signed)
Immediate Anesthesia Transfer of Care Note  Patient: Erika Rios  Procedure(s) Performed: LAPAROSCOPIC CHOLECYSTECTOMY - LATEX ALLERGY (N/A )  Patient Location: PACU  Anesthesia Type:General  Level of Consciousness: awake and drowsy  Airway & Oxygen Therapy: Patient Spontanous Breathing and Patient connected to face mask oxygen  Post-op Assessment: Report given to RN and Post -op Vital signs reviewed and stable  Post vital signs: Reviewed and stable  Last Vitals:  Vitals Value Taken Time  BP 114/89 02/02/2019  3:25 PM  Temp 36.8 C 02/02/2019  3:25 PM  Pulse 71 02/02/2019  3:25 PM  Resp 15 02/02/2019  3:25 PM  SpO2 97 % 02/02/2019  3:25 PM  Vitals shown include unvalidated device data.  Last Pain:  Vitals:   02/02/19 1139  TempSrc: Tympanic         Complications: No apparent anesthesia complications

## 2019-02-02 NOTE — Interval H&P Note (Signed)
History and Physical Interval Note:  02/02/2019 12:14 PM  Erika Rios  has presented today for surgery, with the diagnosis of K81.0, K75.0 CHOLECYSTITIS AND LIVER ABSCESS.  The various methods of treatment have been discussed with the patient and family. After consideration of risks, benefits and other options for treatment, the patient has consented to  Procedure(s): LAPAROSCOPIC CHOLECYSTECTOMY - LATEX ALLERGY (N/A) as a surgical intervention.  The patient's history has been reviewed, patient examined, no change in status, stable for surgery.  I have reviewed the patient's chart and labs.  Questions were answered to the patient's satisfaction.     Shalev Helminiak

## 2019-02-02 NOTE — Op Note (Signed)
  Procedure Date:  02/02/2019  Pre-operative Diagnosis:  Acute cholecystitis with liver abscess  Post-operative Diagnosis:  Acute cholecystitis with liver abscess  Procedure:  Laparoscopic cholecystectomy  Surgeon:  Melvyn Neth, MD  Anesthesia:  General endotracheal  Estimated Blood Loss:  20 ml  Specimens:  gallbladder  Complications:  None  Indications for Procedure:  This is a 58 y.o. female who presents with abdominal pain and workup revealing acute cholecystitis and liver abscess, requiring percutaneous drainage 3 months ago.  She presents now for cholecystectomy.  The benefits, complications, treatment options, and expected outcomes were discussed with the patient. The risks of bleeding, infection, recurrence of symptoms, failure to resolve symptoms, bile duct damage, bile duct leak, retained common bile duct stone, bowel injury, and need for further procedures were all discussed with the patient and she was willing to proceed.  Description of Procedure: The patient was correctly identified in the preoperative area and brought into the operating room.  The patient was placed supine with VTE prophylaxis in place.  Appropriate time-outs were performed.  Anesthesia was induced and the patient was intubated.  Appropriate antibiotics were infused.  The abdomen was prepped and draped in a sterile fashion. An infraumbilical incision was made. A cutdown technique was used to enter the abdominal cavity without injury, and a Hasson trocar was inserted.  Pneumoperitoneum was obtained with appropriate opening pressures.  A 5-mm port was placed in the subxiphoid area and two 5-mm ports were placed in the right upper quadrant under direct visualization.  The gallbladder was identified.  There were some adhesions around the percutaneous drain that were lysed with cautery.  The drain was then cut outside the skin insertion site and removed. The fundus was grasped and retracted cephalad.  Adhesions  were lysed bluntly and with electrocautery. The infundibulum was grasped and retracted laterally, exposing the peritoneum overlying the gallbladder.  This was incised with electrocautery and extended on either side of the gallbladder.  The cystic duct and cystic artery were clearly identified and bluntly dissected.  Both were clipped twice proximally and once distally, cutting in between.  The gallbladder was taken from the gallbladder fossa in a retrograde fashion with electrocautery. The gallbladder was placed in an Endocatch bag. The liver bed was inspected and any bleeding was controlled with electrocautery. The right upper quadrant was then inspected again revealing intact clips, no bleeding, and no ductal injury.  The area was thoroughly irrigated.  The 5 mm ports were removed under direct visualization and the Hasson trocar was removed.  The Endocatch bag was brought out via the umbilical incision. The fascial opening was closed using 0 vicryl suture.  Local anesthetic was infused in all incisions and the incisions were closed with 4-0 Monocryl.  The wounds were cleaned and sealed with DermaBond.  The patient was emerged from anesthesia and extubated and brought to the recovery room for further management.  The patient tolerated the procedure well and all counts were correct at the end of the case.   Melvyn Neth, MD

## 2019-02-03 ENCOUNTER — Encounter: Payer: Self-pay | Admitting: Surgery

## 2019-02-03 ENCOUNTER — Telehealth: Payer: Self-pay

## 2019-02-03 NOTE — Telephone Encounter (Signed)
Patient called and states that she had surgery yesterday and is in a lot of pain. She has taken her Dilaudid but it is not taking all the pain away. Patient advised to use heat to the abdomen for 15-20 minutes several times a day. She may also take Ibuprofen for additional pain relief. She expressed understanding and will call back if no improvement.

## 2019-02-05 LAB — SURGICAL PATHOLOGY

## 2019-02-10 ENCOUNTER — Telehealth: Payer: Self-pay | Admitting: *Deleted

## 2019-02-10 NOTE — Telephone Encounter (Signed)
Patient may go to the beach , She is not having any pain.

## 2019-02-10 NOTE — Telephone Encounter (Signed)
Patient had surgery on 02/02/19 and wants to know if it is okay to go to the beach. Please call and advise

## 2019-02-18 ENCOUNTER — Other Ambulatory Visit: Payer: Self-pay

## 2019-02-18 ENCOUNTER — Telehealth (INDEPENDENT_AMBULATORY_CARE_PROVIDER_SITE_OTHER): Payer: BC Managed Care – PPO | Admitting: Surgery

## 2019-02-18 DIAGNOSIS — K810 Acute cholecystitis: Secondary | ICD-10-CM

## 2019-02-18 DIAGNOSIS — K750 Abscess of liver: Secondary | ICD-10-CM

## 2019-02-18 DIAGNOSIS — Z09 Encounter for follow-up examination after completed treatment for conditions other than malignant neoplasm: Secondary | ICD-10-CM

## 2019-02-18 NOTE — Progress Notes (Signed)
Virtual Visit via Telephone Note  I connected with Erika Rios on 02/18/19 at  9:15 AM EDT by telephone and verified that I am speaking with the correct person using two identifiers.  Location: Patient: Home Provider: Office   I discussed the limitations, risks, security and privacy concerns of performing an evaluation and management service by telephone and the availability of in person appointments. I also discussed with the patient that there may be a patient responsible charge related to this service. The patient expressed understanding and agreed to proceed.  This service was provided via telemedicine.  The patient consented to the visit being carried via telemedicine.  Patient's location:  Home  Provider's location:  Office  Referring Provider:  None  People participating in this telemedicine visit:  Patient, myself  Time spent:  15 minutes   History of Present Illness: 58 year old female status post laparoscopic cholecystectomy on 02/02/19 for history of acute cholecystitis and liver abscess requiring percutaneous drainage.  Reports she is doing very well and denies any pain.  Reports that the umbilical incision is sore at times because the pants lie right over it.  Tolerating diet without any nausea or vomiting.   Observations/Objective: Patient does not appear to be in any acute distress and is able to speak comfortably without any shortness of breath  Assessment and Plan: 58 year old female status post laparoscopic cholecystectomy.  - Patient is healing very well per her description.  Denies any problems with the incisions.  Recommended that she may apply dry gauze dressing over the umbilical incision for padding and comfort that her pants do not irritate the wound. - Return precautions given.  Patient may follow-up with [place] on an as-needed basis  Follow Up Instructions:    I discussed the assessment and treatment plan with the patient. The patient was provided an  opportunity to ask questions and all were answered. The patient agreed with the plan and demonstrated an understanding of the instructions.   The patient was advised to call back or seek an in-person evaluation if the symptoms worsen or if the condition fails to improve as anticipated.  I provided 15 minutes of non-face-to-face time during this encounter.   Olean Ree, MD

## 2019-02-28 ENCOUNTER — Telehealth: Payer: Self-pay | Admitting: *Deleted

## 2019-02-28 NOTE — Telephone Encounter (Signed)
Left message on VM patient can resume relations with her husband.

## 2019-02-28 NOTE — Telephone Encounter (Signed)
Patient called and stated that she had surgery on 02/02/19 and wants to know when she can start have intercourse with her husband again. Please call and advise

## 2019-11-06 ENCOUNTER — Other Ambulatory Visit: Payer: Self-pay | Admitting: Surgery

## 2020-02-05 IMAGING — RF CATHETER CHOLANGIOGRAM
2 series · 7 of 7 positions shown · non-contrast
Comparison: none

INDICATION: The patient underwent cholecystostomy tube 1 month ago. She
presented with acute cholecystitis and suspected liver abscess. She
also has a cardiac history. She has been flushing or tube and denies
any fevers or chills. Examination of the tube site demonstrates
slight erythema at the entry but no swelling or pus. It is
nontender.

[Series 1: cp_standard · 0.17mm/px · 3 of 3 slices shown (1 of 2)]
[im 1/3]
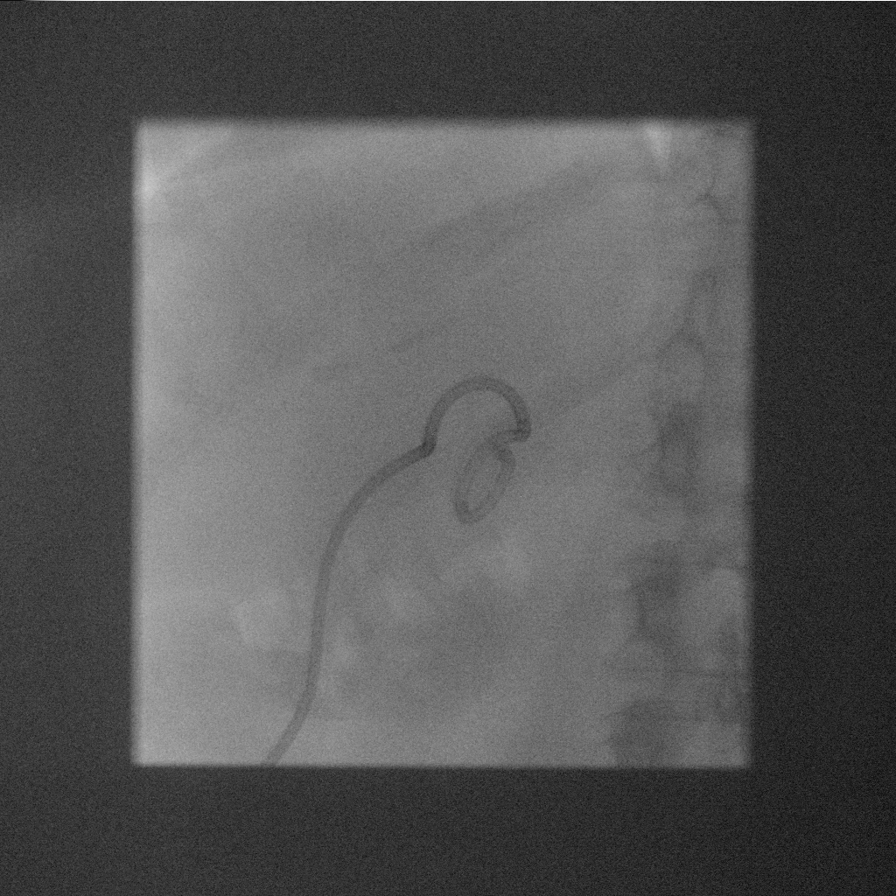
[im 2/3]
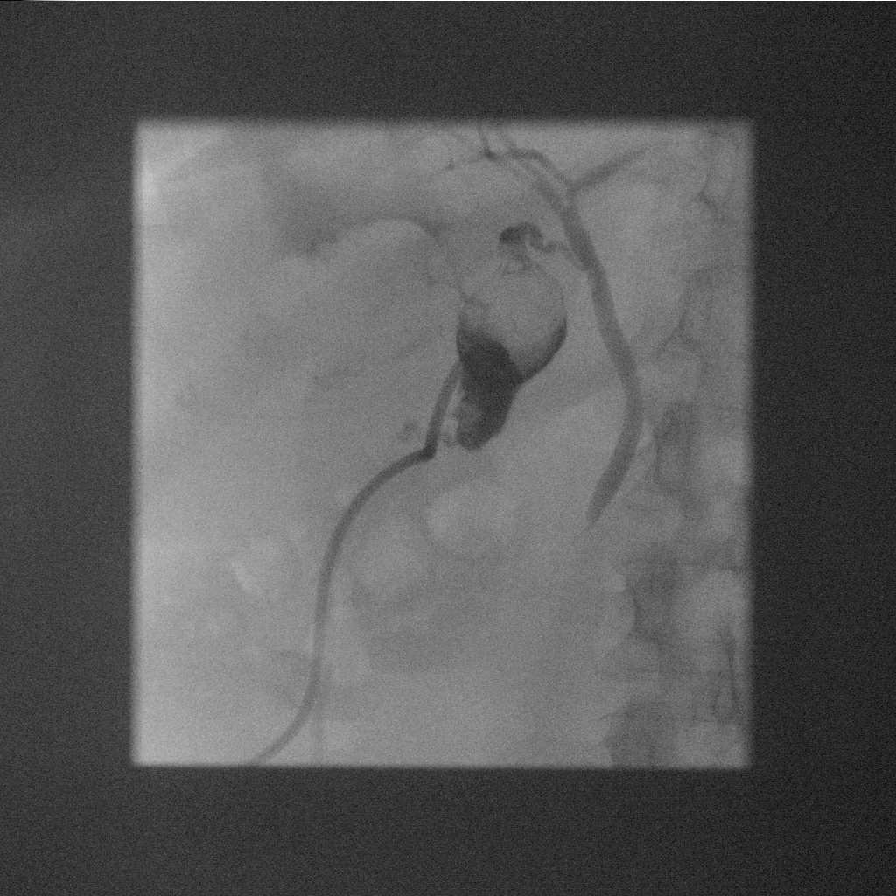
[im 3/3]
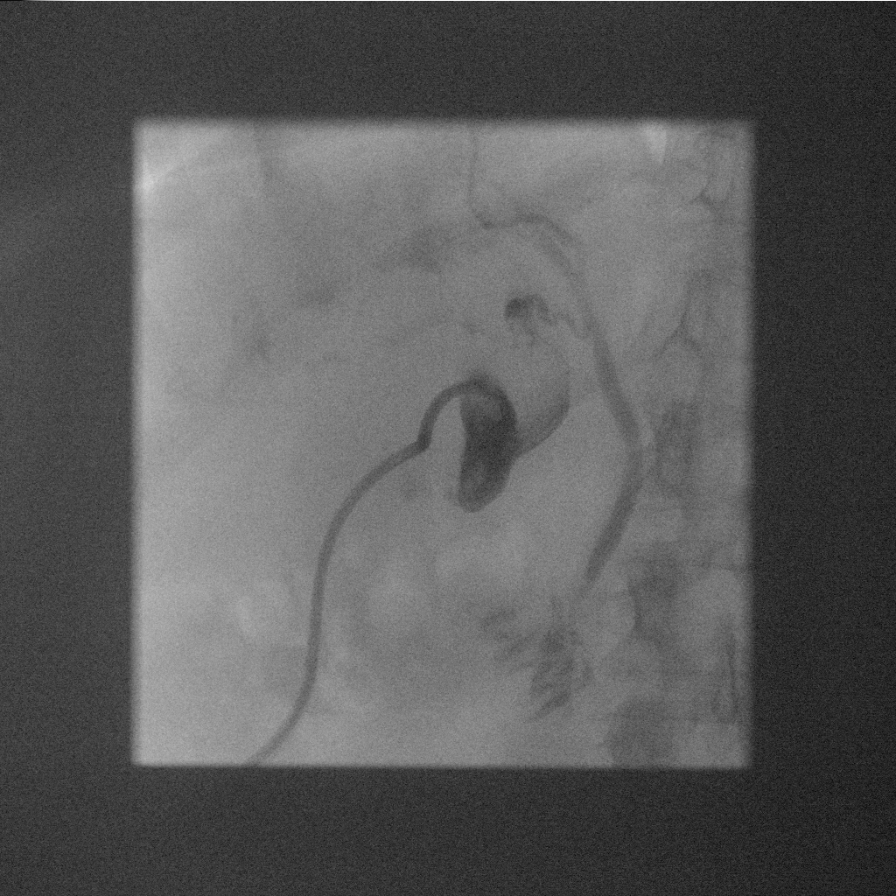

[Series 3: cp_standard · 0.17mm/px · 4 of 78 frames shown (2 of 2)]
[frame 12/78]
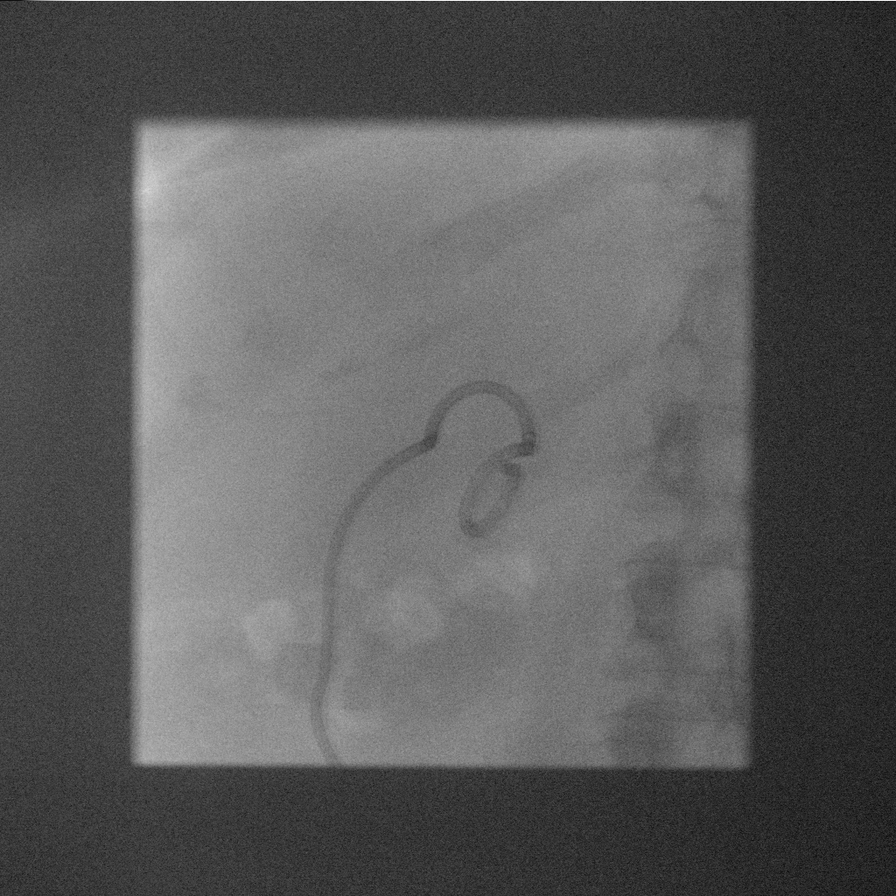
[frame 16/78]
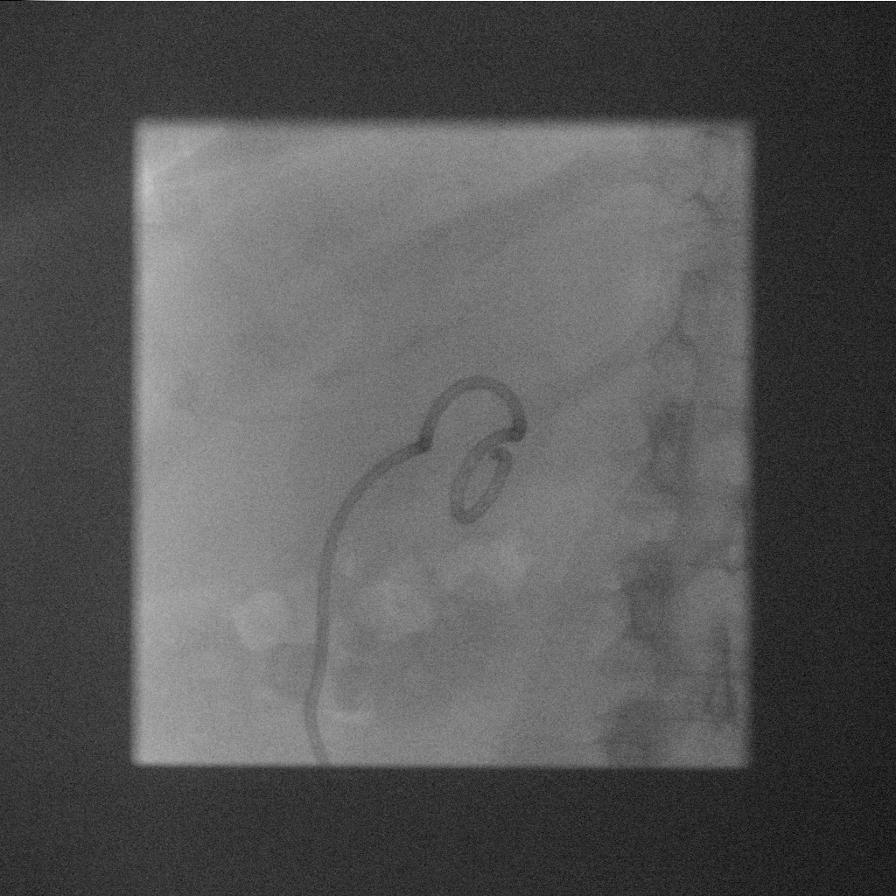
[frame 40/78]
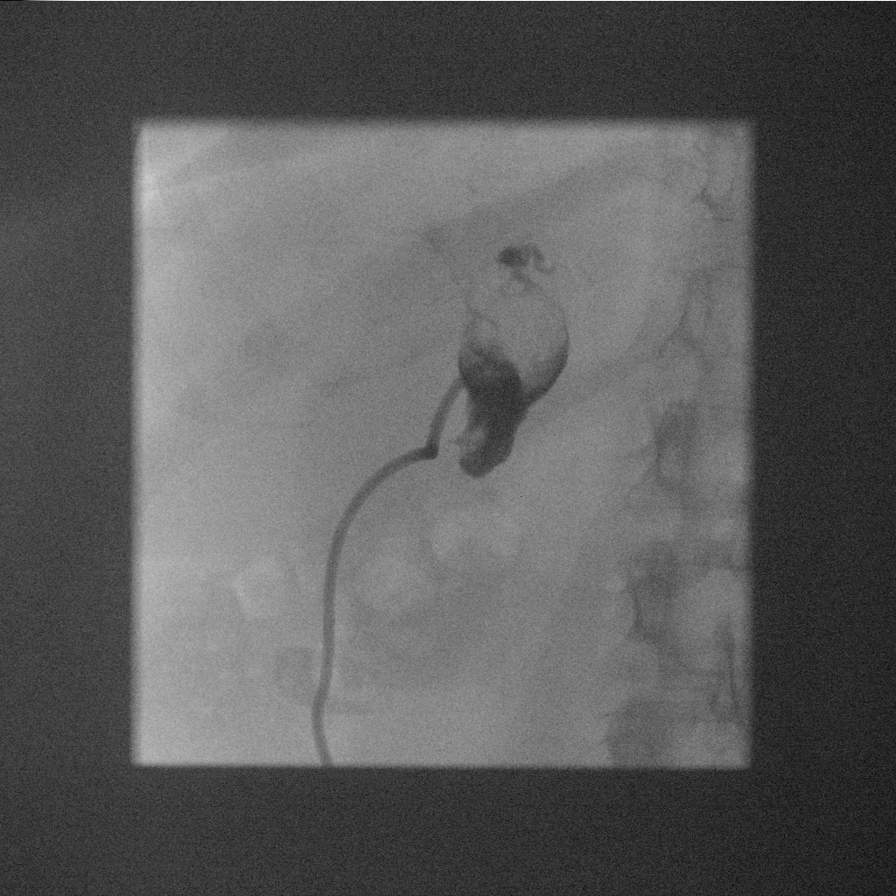
[frame 67/78]
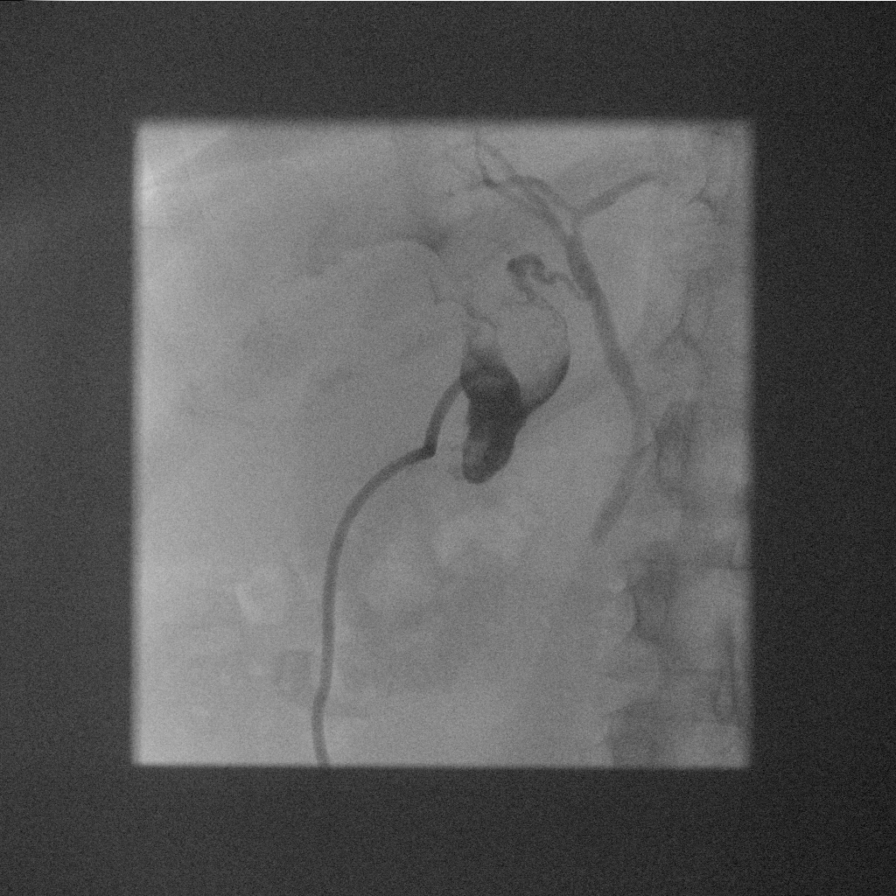

[7 of 7 positions shown; findings below may reference images not displayed]

EXAM:
CHOLECYSTOSTOMY CHOLANGIOGRAM

MEDICATIONS:
None

ANESTHESIA/SEDATION:
None

FLUOROSCOPY TIME:  Fluoroscopy Time:  minutes 24 seconds (9.4 mGy).

COMPLICATIONS:
None immediate.

PROCEDURE:
Informed written consent was obtained from the patient after a
thorough discussion of the procedural risks, benefits and
alternatives. All questions were addressed. Maximal Sterile Barrier
Technique was utilized including caps, mask, sterile gowns, sterile
gloves, sterile drape, hand hygiene and skin antiseptic. A timeout
was performed prior to the initiation of the procedure.

Contrast was injected into the cholecystostomy tube and imaging was
obtained
FINDINGS: The drain is well positioned in the lumen of the gallbladder. A
gallstone is identified. The cystic and common bile ducts are patent
as contrast fills the biliary tree and duodenum.
IMPRESSION: Cystic and common bile ducts are patent.

## 2020-02-08 IMAGING — CT CT ABDOMEN AND PELVIS WITH CONTRAST
2 of 5 series · 16 of 46 positions shown, 18 images · IV contrast (omnipaque)
Comparison: CT scan of October 29, 2018.

CLINICAL DATA: Generalized abdominal pain.

EXAM:
CT ABDOMEN AND PELVIS WITH CONTRAST
TECHNIQUE: Multidetector CT imaging of the abdomen and pelvis was performed
using the standard protocol following bolus administration of
intravenous contrast.
CONTRAST:  100mL OMNIPAQUE IOHEXOL 300 MG/ML  SOLN

[Series 2: abd pelvis · axial · 0.79mm/px · z∈[-1560,-1160]mm · 13 of 94 slices shown, 15 images (1 of 2)]
[im 7/94  soft-tissue]
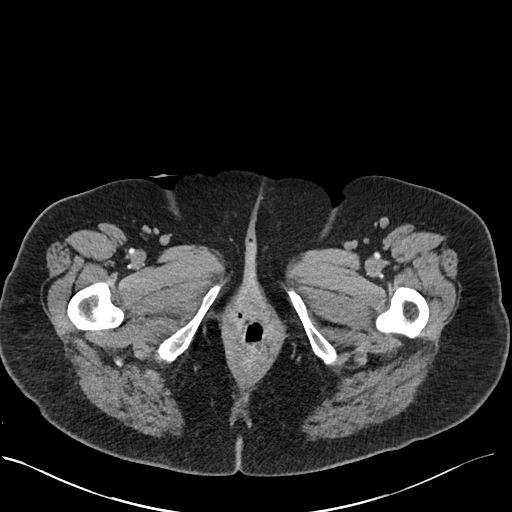
[im 7/94  bone]
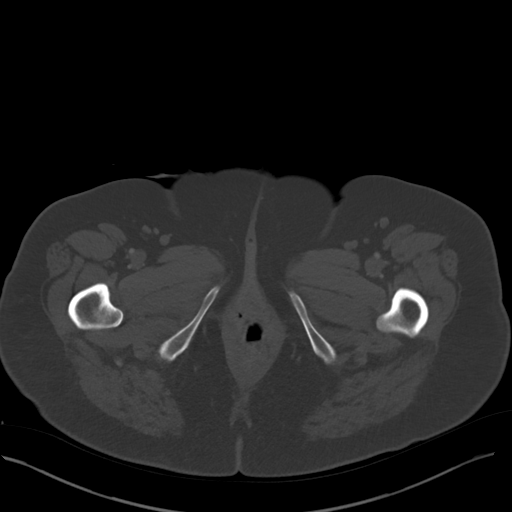
[im 14/94  soft-tissue]
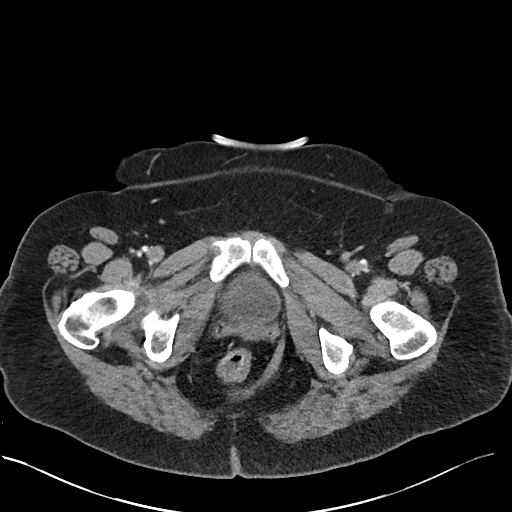
[im 20/94  soft-tissue]
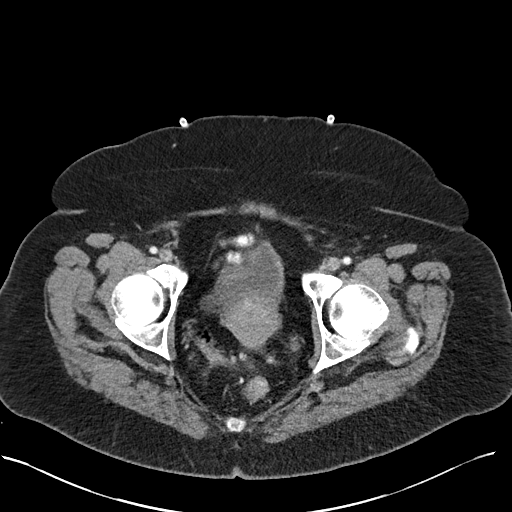
[im 27/94  soft-tissue]
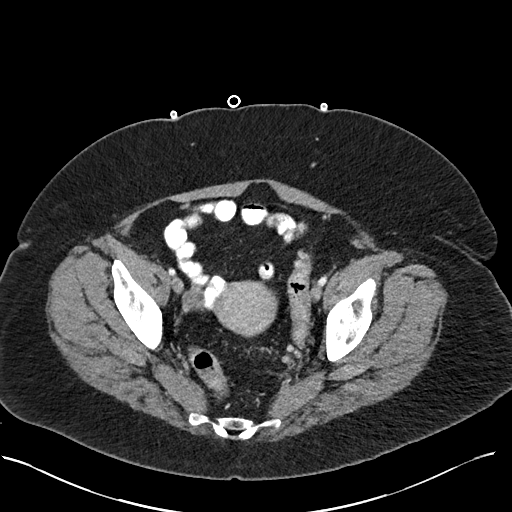
[im 34/94  soft-tissue]
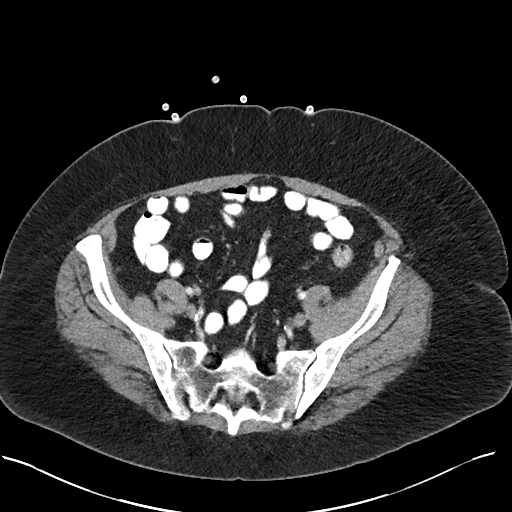
[im 40/94  soft-tissue]
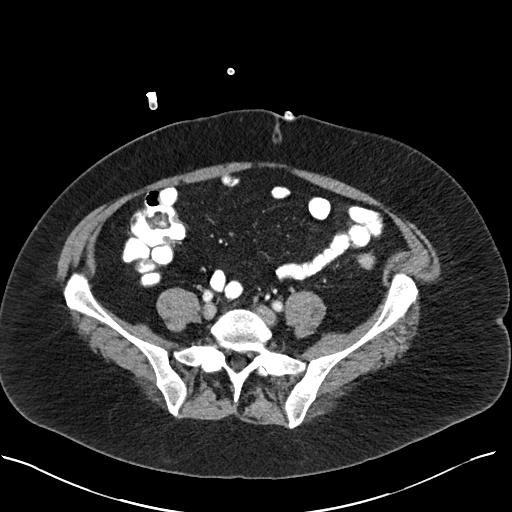
[im 47/94  soft-tissue]
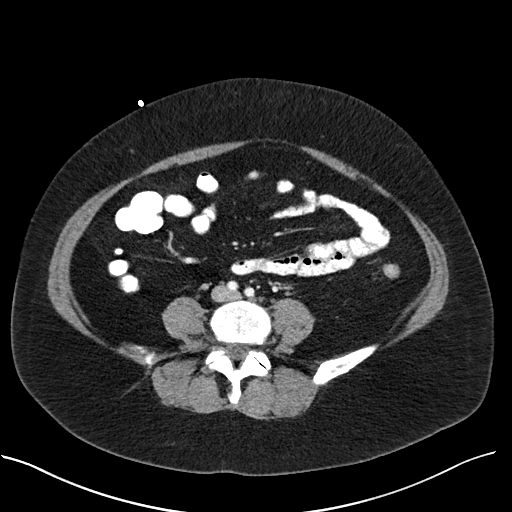
[im 54/94  soft-tissue]
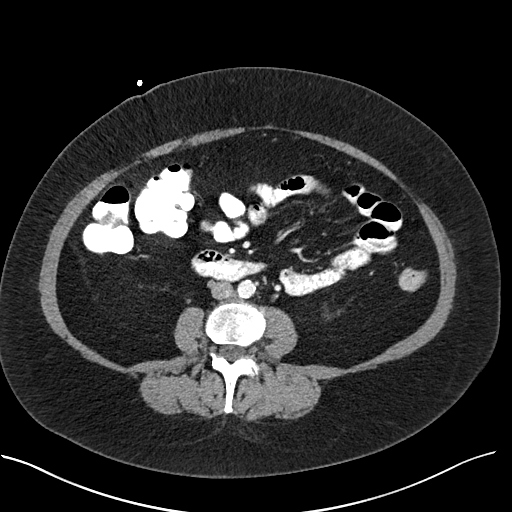
[im 60/94  soft-tissue]
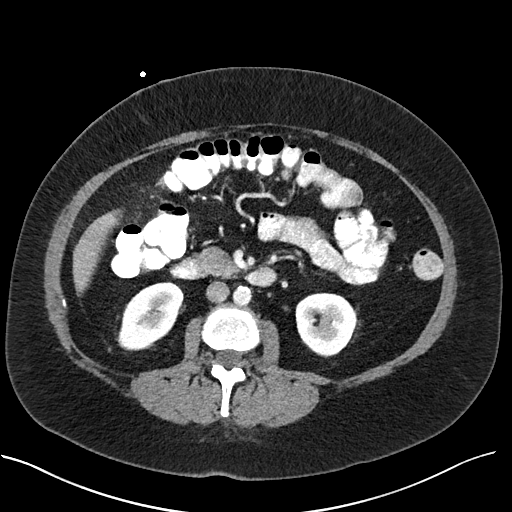
[im 60/94  bone]
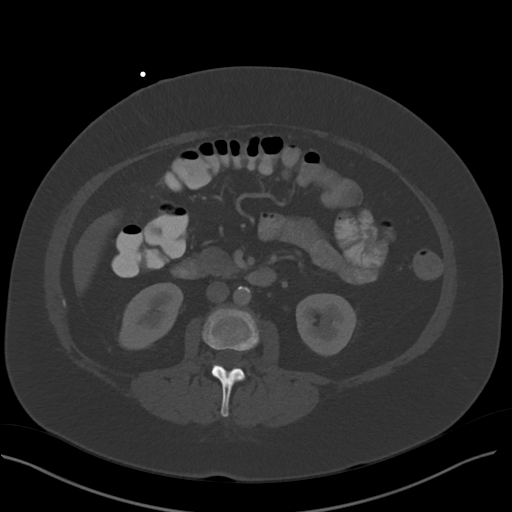
[im 67/94  soft-tissue]
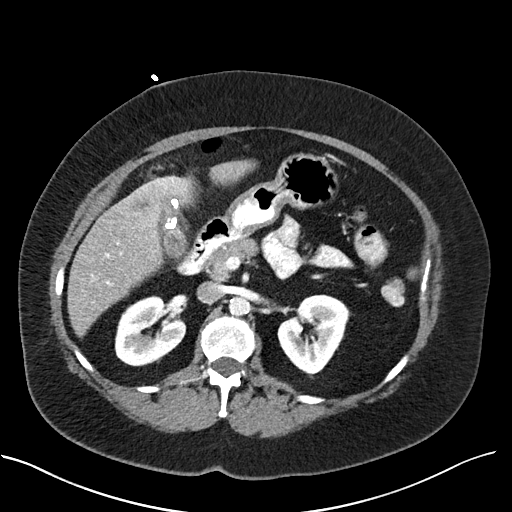
[im 74/94  soft-tissue]
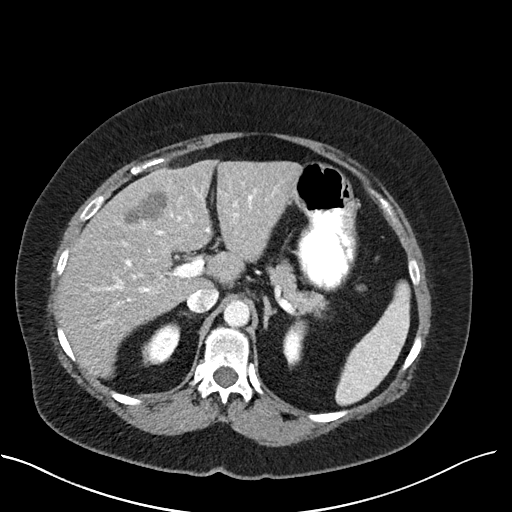
[im 80/94  soft-tissue]
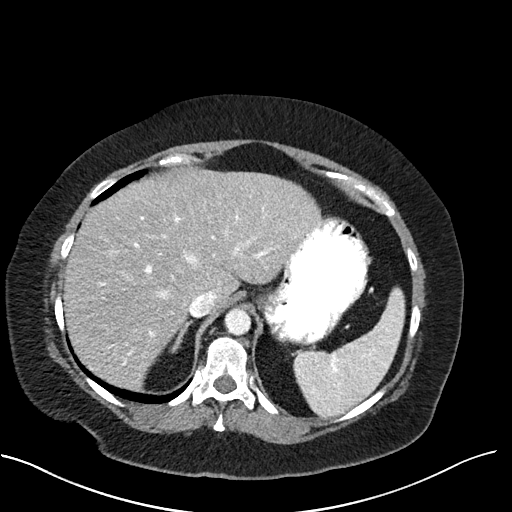
[im 87/94  soft-tissue]
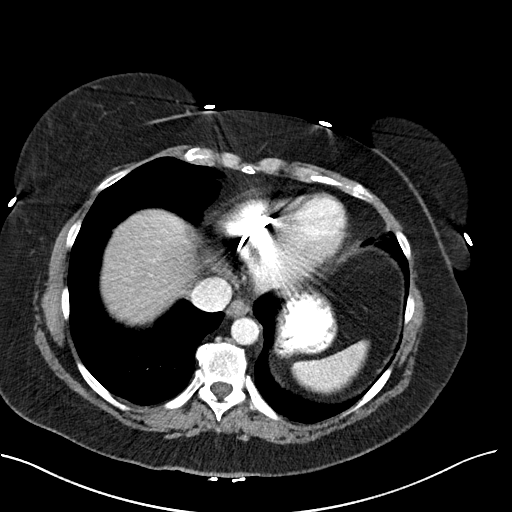

[Series 5: abd pelvis · coronal · 0.79mm/px · 3 of 163 slices shown (2 of 2)]
[im 55/163  soft-tissue]
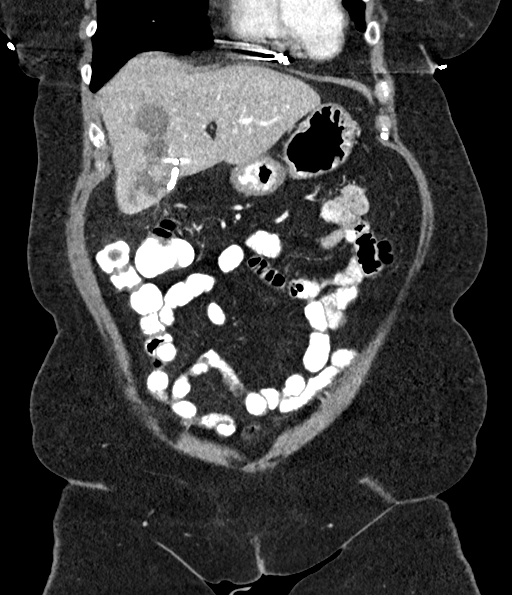
[im 73/163  soft-tissue]
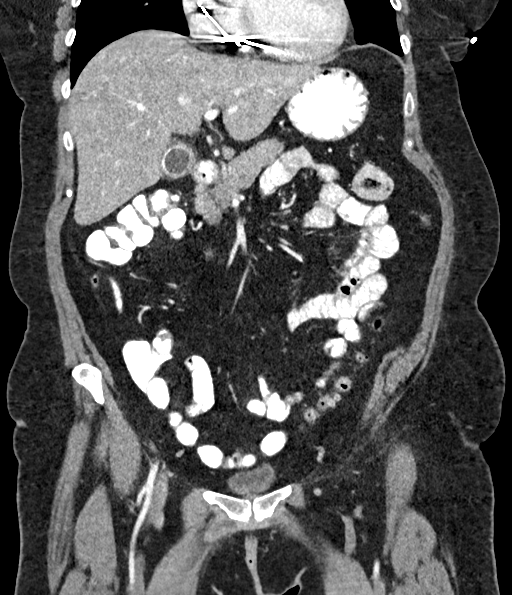
[im 91/163  soft-tissue]
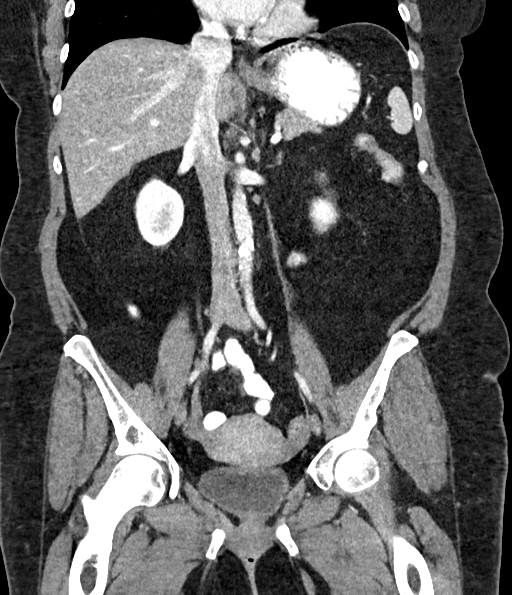

[16 of 46 positions shown; findings below may reference images not displayed]

FINDINGS: Lower chest: No acute abnormality.

Hepatobiliary: Interval placement of percutaneous cholecystostomy.
Single large gallstone is noted. Gallbladder is significantly
decompressed compared to prior exam. Several low densities are seen
in the hepatic parenchyma adjacent to the gallbladder fossa which
are significantly decreased in size compared to prior exam and most
consistent with improving intrahepatic inflammation or possibly
abscesses. The largest in the anterior portion of the right hepatic
lobe measures 3.7 x 2.5 cm, which measured 5.1 x 3.5 cm previously.

Pancreas: Unremarkable. No pancreatic ductal dilatation or
surrounding inflammatory changes.

Spleen: Normal in size without focal abnormality.

Adrenals/Urinary Tract: Adrenal glands are unremarkable. Kidneys are
normal, without renal calculi, focal lesion, or hydronephrosis.
Bladder is unremarkable.

Stomach/Bowel: Stomach is within normal limits. Appendix appears
normal. No evidence of bowel wall thickening, distention, or
inflammatory changes.

Vascular/Lymphatic: Aortic atherosclerosis. No enlarged abdominal or
pelvic lymph nodes.

Reproductive: Uterus and bilateral adnexa are unremarkable.

Other: No abdominal wall hernia or abnormality. No abdominopelvic
ascites.

Musculoskeletal: No acute or significant osseous findings.
IMPRESSION: Interval placement of percutaneous cholecystostomy. Gallbladder is
significantly decompressed compared to prior exam. Large solitary
gallstone is again noted. Adjacent low densities in the hepatic
parenchyma are also significantly decreased compared to prior exam,
consistent with improving inflammation or abscesses compared to
prior exam.

Aortic Atherosclerosis ([id number]).

## 2020-02-09 ENCOUNTER — Other Ambulatory Visit: Payer: Self-pay | Admitting: Internal Medicine

## 2020-04-23 ENCOUNTER — Other Ambulatory Visit: Payer: Self-pay | Admitting: Critical Care Medicine

## 2020-04-23 ENCOUNTER — Other Ambulatory Visit: Payer: BC Managed Care – PPO

## 2020-04-23 ENCOUNTER — Other Ambulatory Visit: Payer: 59

## 2020-04-23 DIAGNOSIS — Z20822 Contact with and (suspected) exposure to covid-19: Secondary | ICD-10-CM

## 2020-04-23 DIAGNOSIS — Z8616 Personal history of COVID-19: Secondary | ICD-10-CM

## 2020-04-23 HISTORY — DX: Personal history of COVID-19: Z86.16

## 2020-04-24 LAB — NOVEL CORONAVIRUS, NAA: SARS-CoV-2, NAA: DETECTED — AB

## 2020-04-24 LAB — SARS-COV-2, NAA 2 DAY TAT

## 2020-11-01 ENCOUNTER — Other Ambulatory Visit (HOSPITAL_COMMUNITY): Payer: Self-pay | Admitting: Medical

## 2020-11-01 ENCOUNTER — Other Ambulatory Visit: Payer: Self-pay | Admitting: Medical

## 2020-11-01 DIAGNOSIS — M545 Low back pain, unspecified: Secondary | ICD-10-CM

## 2020-11-01 DIAGNOSIS — M542 Cervicalgia: Secondary | ICD-10-CM

## 2021-01-08 ENCOUNTER — Other Ambulatory Visit: Payer: Self-pay | Admitting: Internal Medicine

## 2021-01-08 DIAGNOSIS — N6325 Unspecified lump in the left breast, overlapping quadrants: Secondary | ICD-10-CM

## 2021-01-14 ENCOUNTER — Ambulatory Visit
Admission: RE | Admit: 2021-01-14 | Discharge: 2021-01-14 | Disposition: A | Discharge status: Home / Self Care | Payer: 59 | Source: Ambulatory Visit | Attending: Internal Medicine | Admitting: Internal Medicine

## 2021-01-14 ENCOUNTER — Other Ambulatory Visit: Payer: Self-pay

## 2021-01-14 DIAGNOSIS — N6325 Unspecified lump in the left breast, overlapping quadrants: Secondary | ICD-10-CM

## 2021-01-15 ENCOUNTER — Other Ambulatory Visit: Payer: Self-pay | Admitting: Internal Medicine

## 2021-01-15 DIAGNOSIS — R928 Other abnormal and inconclusive findings on diagnostic imaging of breast: Secondary | ICD-10-CM

## 2021-01-15 DIAGNOSIS — N6320 Unspecified lump in the left breast, unspecified quadrant: Secondary | ICD-10-CM

## 2021-01-23 ENCOUNTER — Other Ambulatory Visit: Payer: Self-pay

## 2021-01-23 ENCOUNTER — Ambulatory Visit
Admission: RE | Admit: 2021-01-23 | Discharge: 2021-01-23 | Disposition: A | Discharge status: Home / Self Care | Payer: 59 | Source: Ambulatory Visit | Attending: Internal Medicine | Admitting: Internal Medicine

## 2021-01-23 DIAGNOSIS — N6320 Unspecified lump in the left breast, unspecified quadrant: Secondary | ICD-10-CM | POA: Diagnosis present

## 2021-01-23 DIAGNOSIS — C50919 Malignant neoplasm of unspecified site of unspecified female breast: Secondary | ICD-10-CM

## 2021-01-23 DIAGNOSIS — R928 Other abnormal and inconclusive findings on diagnostic imaging of breast: Secondary | ICD-10-CM

## 2021-01-23 HISTORY — DX: Malignant neoplasm of unspecified site of unspecified female breast: C50.919

## 2021-01-24 ENCOUNTER — Encounter: Payer: Self-pay | Admitting: *Deleted

## 2021-01-24 DIAGNOSIS — C50912 Malignant neoplasm of unspecified site of left female breast: Secondary | ICD-10-CM

## 2021-01-24 HISTORY — PX: BREAST BIOPSY: SHX20

## 2021-01-24 NOTE — Progress Notes (Signed)
Received message from Electa Sniff RN that patient had been informed of her new diagnosis of lymph node positive invasive mammary carcinoma.  Called patient to introduce her to navigation services.  Patient states she would like to see Dr. Bary Castilla for surgical consultation.  I have scheduled patient to see him on 01/29/21 @  11:15, and Dr. Grayland Ormond  For medical oncology on Thursday 01/31/21 @ 3:00.  Will give educational material at that time.  She is to call with any questions or needs.

## 2021-01-24 NOTE — Progress Notes (Signed)
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  INTERVAL HISTORY: Patient is a 60 year old female with a past medical history significant for cardiomyopathy and EF of 25% who recently self palpated a mass in her left breast.  Subsequent mammogram, ultrasound, biopsy revealed the above-stated malignancy.  She currently feels well and is at her baseline.  She has no neurologic complaints.  She denies any recent fevers or illnesses.  She is a good appetite and denies weight loss.  She has no chest pain, shortness of breath, cough, or hemoptysis.  She denies any nausea, vomiting, constipation, or diarrhea.  She has no urinary complaints.  Patient offers no specific complaints today.  REVIEW OF SYSTEMS:   Review of Systems  Constitutional: Negative.  Negative for fever, malaise/fatigue and weight loss.  Respiratory: Negative.  Negative for cough, hemoptysis and shortness of breath.   Cardiovascular: Negative.  Negative for chest pain and leg swelling.  Gastrointestinal: Negative.  Negative for abdominal pain.  Genitourinary: Negative.  Negative for frequency.  Musculoskeletal: Negative.  Negative for back pain.  Skin: Negative.  Negative for rash.  Neurological: Negative.  Negative for dizziness, focal weakness, weakness and headaches.  Psychiatric/Behavioral: Negative.  The patient is not nervous/anxious.     As per HPI. Otherwise, a complete review of systems is negative.  PAST MEDICAL HISTORY: Past Medical History:  Diagnosis Date  . AICD (automatic cardioverter/defibrillator) present 10/2018  . Bronchitis    h/o  . CHF (congestive heart failure)  ([place])   . Family history of adverse reaction to anesthesia    son hard to wake up  . GERD (gastroesophageal reflux disease)   . ST elevation myocardial infarction (STEMI) of anterior wall, initial episode of care ([place]) 2017  . Tobacco abuse    quit smoking 2017    PAST SURGICAL HISTORY: Past Surgical History:  Procedure Laterality Date  . BREAST BIOPSY Right 12/10/2016   neg bx FIBROADENOMATOUS CHANGES WITH STROMAL AND LUMINAL CALCIFICATIONS. NEGATIVE  . CARDIAC CATHETERIZATION N/A 02/24/2016   Procedure: Left Heart Cath and Coronary Angiography;  Surgeon: Troy Sine, MD;  Location: [place] [facility];  Service: Cardiovascular;  Laterality: N/A;  . CARDIAC CATHETERIZATION N/A 02/24/2016   Procedure: Coronary Stent Intervention;  Surgeon: Troy Sine, MD;  Location: [place] [facility];  Service: Cardiovascular;  Laterality: N/A;  . CHOLECYSTECTOMY N/A 02/02/2019   Procedure: LAPAROSCOPIC CHOLECYSTECTOMY - LATEX ALLERGY;  Surgeon: Olean Ree, MD;  Location: [facility];  Service: General;  Laterality: N/A;  . IMPLANTABLE CARDIOVERTER DEFIBRILLATOR (ICD) GENERATOR CHANGE Left 10/22/2018   Procedure: ICD GENERATOR INSERTION;  Surgeon: Marzetta Board, MD;  Location: [facility];  Service: Cardiovascular;  Laterality: Left;  . IR BILIARY DRAIN PLACEMENT WITH CHOLANGIOGRAM      FAMILY HISTORY: Family History  Problem Relation Age of Onset  . Cancer Mother   . Heart disease Mother   . Heart attack Mother   . Breast cancer Mother   . Diabetes Father     ADVANCED DIRECTIVES (Y/N):  N  HEALTH MAINTENANCE: Social History   Tobacco Use  . Smoking status: Former Smoker    Packs/day: 0.50    Years: 34.00  Pack years: 17.00    Types: Cigarettes    Quit date: 03/19/2016    Years since quitting: 4.8  . Smokeless tobacco: Never Used  Vaping Use  . Vaping Use: Never used  Substance Use Topics  . Alcohol use: Yes    Alcohol/week: 0.0 standard drinks    Comment: occ  . Drug use: No      Colonoscopy:  PAP:  Bone density:  Lipid panel:  Allergies  Allergen Reactions  . Statins Rash and Other (See Comments)    Hip pain, constant   . Elastic Bandages & [Zinc] Rash    Paper tape should be okay  . Iron Nausea And Vomiting  . Latex Dermatitis  . Nsaids     Told not to take d/t heart status  . Oxycodone Anxiety    Current Outpatient Medications  Medication Sig Dispense Refill  . acetaminophen (TYLENOL) 500 MG tablet Take 2 tablets (1,000 mg total) by mouth every 6 (six) hours as needed for mild pain, moderate pain or headache. 30 tablet 0  . aspirin EC 325 MG EC tablet Take 1 tablet (325 mg total) by mouth 2 (two) times daily. (Patient taking differently: Take 325 mg by mouth daily. Once a day in the morning) 30 tablet 0  . carvedilol (COREG) 3.125 MG tablet Take 3.125 mg by mouth 2 (two) times daily with a meal.    . ibuprofen (ADVIL) 200 MG tablet Take 600 mg by mouth every 6 (six) hours as needed for headache or moderate pain.    Marland Kitchen lisinopril (PRINIVIL,ZESTRIL) 2.5 MG tablet Take 1 tablet (2.5 mg total) by mouth daily. PLEASE CONTACT OFFICE FOR ADDITIONAL REFILLS (Patient taking differently: Take 2.5 mg by mouth daily. In the morning) 30 tablet 0  . miconazole (MICOTIN) 2 % powder Apply 1 application topically as needed for itching.    . nitroGLYCERIN (NITROSTAT) 0.4 MG SL tablet PLACE 1 TABLET UNDER TONGUE EVERY 5 MINUTES IF NEEDED FOR CHEST PAIN UP TO 3 DOSES (Patient taking differently: Place 0.4 mg under the tongue every 5 (five) minutes as needed for chest pain.) 25 tablet 5  . Potassium 99 MG TABS Take 99 mg by mouth daily.    Marland Kitchen spironolactone (ALDACTONE) 25 MG tablet Take 0.5 tablets (12.5 mg total) by mouth daily. (Patient taking differently: Take 12.5 mg by mouth every evening.) 30 tablet 6  . triamcinolone cream (KENALOG) 0.1 % Apply 1 application topically daily as needed (rash).   3  . HYDROmorphone (DILAUDID) 2 MG tablet Take 2 mg by mouth 3 (three)  times daily as needed for severe pain.  (Patient not taking: Reported on 01/31/2021)    . pantoprazole (PROTONIX) 40 MG tablet Take 1 tablet (40 mg total) by mouth 2 (two) times daily before a meal. (Patient not taking: Reported on 01/31/2021) 60 tablet 0  . sodium chloride 0.9 % injection 5 mLs by Intracatheter route daily. Flush drain with 5 ml normal saline once daily.  Do not aspirate. (Patient not taking: Reported on 01/31/2021) 150 mL 0  . sodium chloride flush (NS) 0.9 % SOLN Flush drain with 68m normal saline once daily. Do not aspirate. (Patient not taking: Reported on 01/31/2021) 200 mL 0   No current facility-administered medications for this visit.    OBJECTIVE: Vitals:   01/31/21 1514  BP: 128/71  Pulse: 74  Temp: 98.1 F (36.7 C)  SpO2: 97%     Body mass index is 36.77 kg/m.    ECOG FS:0 -  Asymptomatic  General: Well-developed, well-nourished, no acute distress. Eyes: Pink conjunctiva, anicteric sclera. HEENT: Normocephalic, moist mucous membranes. Breasts: Exam performed by another provider. Lungs: No audible wheezing or coughing. Heart: Regular rate and rhythm. Abdomen: Soft, nontender, no obvious distention. Musculoskeletal: No edema, cyanosis, or clubbing. Neuro: Alert, answering all questions appropriately. Cranial nerves grossly intact. Skin: No rashes or petechiae noted. Psych: Normal affect. Lymphatics: No cervical, calvicular, axillary or inguinal LAD.   LAB RESULTS:  Lab Results  Component Value Date   NA 137 02/02/2019   K 3.8 02/02/2019   CL 108 02/02/2019   CO2 21 (L) 02/02/2019   GLUCOSE 96 02/02/2019   BUN 13 02/02/2019   CREATININE 0.70 02/02/2019   CALCIUM 8.8 (L) 02/02/2019   PROT 6.8 02/02/2019   ALBUMIN 3.7 02/02/2019   AST 20 02/02/2019   ALT 25 02/02/2019   ALKPHOS 88 02/02/2019   BILITOT 0.5 02/02/2019   GFRNONAA >60 02/02/2019   GFRAA >60 02/02/2019    Lab Results  Component Value Date   WBC 9.0 02/02/2019   NEUTROABS 5.2  02/02/2019   HGB 12.7 02/02/2019   HCT 37.8 02/02/2019   MCV 99.0 02/02/2019   PLT 240 02/02/2019     STUDIES: US BREAST LTD UNI LEFT INC AXILLA  Result Date: 01/14/2021 CLINICAL DATA:  Palpable lump in the left breast EXAM: DIGITAL DIAGNOSTIC BILATERAL MAMMOGRAM WITH TOMOSYNTHESIS AND CAD; ULTRASOUND LEFT BREAST LIMITED TECHNIQUE: Bilateral digital diagnostic mammography and breast tomosynthesis was performed. The images were evaluated with computer-aided detection.; Targeted ultrasound examination of the left breast was performed COMPARISON:  Previous exam(s). ACR Breast Density Category b: There are scattered areas of fibroglandular density. FINDINGS: Previously biopsied calcifications in the medial central right breast at a posterior depth are stable based on magnified views. No other suspicious findings or changes in the right breast. Calcifications in the medial left breast determined to be probably benign in 2018 are stable, consistent with benign calcifications. There is new distortion containing calcifications in the posterolateral left breast. 9.7 cm anterior to this distortion is a small spiculated mass in the slightly lateral subareolar left breast. There are calcifications between the posterior distortion and the anterior spiculated mass. On physical exam, a lump is felt in the posterolateral left breast. Targeted ultrasound is performed, showing an irregular mass containing calcifications in the left breast at 2:30, 11 cm from the nipple measuring at least 2.1 x 1.2 by 2.7 cm. Ultrasound probably underestimates the size of this mass. There is a small spiculated mass in the left retroareolar region at 4 o'clock measuring 10 x 9 x 14 mm, correlating with the small spiculated mass seen in this location mammographically. There is an abnormal lymph node in the left axilla with a cortex measuring 7 mm. IMPRESSION: Highly suspicious masslike distortion in the posterolateral left breast. Highly  suspicious small spiculated mass in the left slightly lateral retroareolar region. Suspicious calcifications spanning the lateral left breast between the distortion and the spiculated mass. Taken in total, these abnormalities span 12.2 cm. Suspicious left axillary lymph node. Benign medial calcifications seen bilaterally. RECOMMENDATION: Recommend ultrasound-guided biopsy of the masslike distortion in the left breast at 2:30, 11 cm from the nipple. Recommend ultrasound-guided biopsy of the small spiculated mass in the left retroareolar region at 4 o'clock. Recommend ultrasound-guided biopsy of the abnormal left axillary lymph node. If the biopsies are malignant, and breast conservation is being considered, recommend breast MRI as imaging may underestimate the span of involvement. Taken together, the  posterolateral distortion, the anterior small spiculated mass, and the calcifications between the masses span 12.2 cm. If breast conservation is still being considered after breast MRI, recommend biopsying the calcifications between the distortion and the spiculated mass to determine whether the calcifications are part of the malignant process. I have discussed the findings and recommendations with the patient. If applicable, a reminder letter will be sent to the patient regarding the next appointment. BI-RADS CATEGORY  5: Highly suggestive of malignancy. Electronically Signed   By: Dorise Bullion III M.D   On: 01/14/2021 16:17   MM DIAG BREAST TOMO BILATERAL  Result Date: 01/14/2021 CLINICAL DATA:  Palpable lump in the left breast EXAM: DIGITAL DIAGNOSTIC BILATERAL MAMMOGRAM WITH TOMOSYNTHESIS AND CAD; ULTRASOUND LEFT BREAST LIMITED TECHNIQUE: Bilateral digital diagnostic mammography and breast tomosynthesis was performed. The images were evaluated with computer-aided detection.; Targeted ultrasound examination of the left breast was performed COMPARISON:  Previous exam(s). ACR Breast Density Category b: There are  scattered areas of fibroglandular density. FINDINGS: Previously biopsied calcifications in the medial central right breast at a posterior depth are stable based on magnified views. No other suspicious findings or changes in the right breast. Calcifications in the medial left breast determined to be probably benign in 2018 are stable, consistent with benign calcifications. There is new distortion containing calcifications in the posterolateral left breast. 9.7 cm anterior to this distortion is a small spiculated mass in the slightly lateral subareolar left breast. There are calcifications between the posterior distortion and the anterior spiculated mass. On physical exam, a lump is felt in the posterolateral left breast. Targeted ultrasound is performed, showing an irregular mass containing calcifications in the left breast at 2:30, 11 cm from the nipple measuring at least 2.1 x 1.2 by 2.7 cm. Ultrasound probably underestimates the size of this mass. There is a small spiculated mass in the left retroareolar region at 4 o'clock measuring 10 x 9 x 14 mm, correlating with the small spiculated mass seen in this location mammographically. There is an abnormal lymph node in the left axilla with a cortex measuring 7 mm. IMPRESSION: Highly suspicious masslike distortion in the posterolateral left breast. Highly suspicious small spiculated mass in the left slightly lateral retroareolar region. Suspicious calcifications spanning the lateral left breast between the distortion and the spiculated mass. Taken in total, these abnormalities span 12.2 cm. Suspicious left axillary lymph node. Benign medial calcifications seen bilaterally. RECOMMENDATION: Recommend ultrasound-guided biopsy of the masslike distortion in the left breast at 2:30, 11 cm from the nipple. Recommend ultrasound-guided biopsy of the small spiculated mass in the left retroareolar region at 4 o'clock. Recommend ultrasound-guided biopsy of the abnormal left  axillary lymph node. If the biopsies are malignant, and breast conservation is being considered, recommend breast MRI as imaging may underestimate the span of involvement. Taken together, the posterolateral distortion, the anterior small spiculated mass, and the calcifications between the masses span 12.2 cm. If breast conservation is still being considered after breast MRI, recommend biopsying the calcifications between the distortion and the spiculated mass to determine whether the calcifications are part of the malignant process. I have discussed the findings and recommendations with the patient. If applicable, a reminder letter will be sent to the patient regarding the next appointment. BI-RADS CATEGORY  5: Highly suggestive of malignancy. Electronically Signed   By: Dorise Bullion III M.D   On: 01/14/2021 16:17   MM CLIP PLACEMENT LEFT  Result Date: 01/23/2021 CLINICAL DATA:  Evaluate post biopsy marker clips following  ultrasound-guided core needle biopsy of 2 left breast masses and an abnormal left axillary lymph node. The axillary lymph node was deep and not able to be visualized in the mammographic field of view. EXAM: DIAGNOSTIC LEFT MAMMOGRAM POST ULTRASOUND BIOPSY COMPARISON:  Previous exam(s). FINDINGS: Mammographic images were obtained following ultrasound guided biopsy of 2 left breast masses and an abnormal left axillary lymph node. The heart shaped biopsy clip lies within the irregular mass in the upper outer left breast, the vision clip lies in the retroareolar left breast, lower outer quadrant, and the HydroMARK clip in the axillary lymph node could not be visualized, but was well seen deploying within the abnormal lymph node sonographically. IMPRESSION: Appropriate positioning of the heart shaped biopsy marking clip and vision biopsy marking clip at the site of biopsy in the left breast as detailed above. Final Assessment: Post Procedure Mammograms for Marker Placement Electronically Signed    By: Lajean Manes M.D.   On: 01/23/2021 09:38   [place] LT BREAST BX W LOC DEV 1ST LESION IMG BX SPEC US GUIDE  Addendum Date: 01/24/2021   ADDENDUM REPORT: 01/24/2021 11:57 ADDENDUM: PATHOLOGY revealed: Site A. LEFT BREAST, 2:30 11 CMFN; ULTRASOUND-GUIDED BIOPSY: - INVASIVE MAMMARY CARCINOMA, NO SPECIAL TYPE. Size of invasive carcinoma: 13 mm in this sample. Grade 3. Ductal carcinoma in situ: Present. Lymphovascular invasion: Not identified. Comment: The definitive grade will be assigned on the excisional specimen. ER/PR/HER2:Immunohistochemistry will be performed on block A1, with reflex to [place] for HER2 2+. The results will be reported in an addendum. Pathology results are CONCORDANT with imaging findings, per Dr. Lajean Manes. PATHOLOGY revealed: Site B. 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 Size of invasive carcinoma: 4 mm in this sample. Grade 3. Ductal carcinoma in situ: Not identified. Lymphovascular invasion: Not identified. Comment: The definitive grade will be assigned on the excisional specimen. ER/PR/HER2 Immunohistochemistry will be performed on block B1, with reflex to [place] for HER2 2+. The results will be reported in an addendum. Pathology results are CONCORDANT with imaging findings, per Dr. Lajean Manes. PATHOLOGY revealed: Site C. 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 Lajean Manes. Pathology results and recommendations below were discussed with patient by telephone on 01/24/2021. Patient reported biopsy site within normal limits with slight tenderness at the site. Post biopsy care instructions were reviewed, questions were answered and my direct phone number was provided to patient. Patient was instructed to call [facility] if any concerns or questions arise related  to the biopsy. Recommendations: 1. Surgical and oncological consultation for all three sites above (A, B, and C). Request for surgical and oncological consultation relayed to Al Pimple RN and Tanya Nones RN at [facility] by Electa Sniff RN on 01/24/2021. 2. Consider bilateral breast MRI given the two areas of left malignancy may be part of a larger area of cancer throughout the lateral left breast. Pathology results reported by Electa Sniff RN on 01/24/2021. Electronically Signed   By: Lajean Manes M.D.   On: 01/24/2021 11:57   Result Date: 01/24/2021 CLINICAL DATA:  Patient presents for ultrasound-guided core needle biopsy of a 2.7 cm palpable mass in the upper outer left breast at 2:30 o'clock, a smaller mass with architectural distortion in the 4 o'clock retroareolar position of the left breast and an abnormal left axillary lymph node. EXAM: ULTRASOUND  GUIDED LEFT BREAST CORE NEEDLE BIOPSY: 2 BIOPSIES PERFORMED ULTRASOUND GUIDED LEFT AXILLARY LYMPH NODE CORE NEEDLE BIOPSY COMPARISON:  Previous exam(s). PROCEDURE: I met with the patient and we discussed the procedure of ultrasound-guided biopsy, including benefits and alternatives. We discussed the high likelihood of a successful procedure. We discussed the risks of the procedure, including infection, bleeding, tissue injury, clip migration, and inadequate sampling. Informed written consent was given. The usual time-out protocol was performed immediately prior to the procedure. Lesion #1: Mass, 2:30 o'clock, 11 cmfn. Lesion quadrant: Upper outer quadrant Using sterile technique and 1% Lidocaine as local anesthetic, under direct ultrasound visualization, a 12 gauge spring-loaded device was used to perform biopsy of the 2.7 cm irregular mass using a lateral approach. At the conclusion of the procedure heart shaped tissue marker clip was deployed into the biopsy cavity. Lesion #2: Mass, 4 o'clock, retroareolar. Lesion quadrant: Lower outer  quadrant, retroareolar. Using sterile technique and 1% Lidocaine as local anesthetic, under direct ultrasound visualization, a 14 gauge spring-loaded device was used to perform biopsy of the smaller irregular mass in the 4 o'clock retroareolar left breast using a lateral approach. At the conclusion of the procedure vision tissue marker clip was deployed into the biopsy cavity. Lesion #3: Abnormal left axillary lymph node. Lesion location: Left axilla Using sterile technique and 1% Lidocaine as local anesthetic, under direct ultrasound visualization, a 14 gauge spring-loaded device was used to perform biopsy of the abnormal level 2 left axillary lymph node using a inferolateral approach. At the conclusion of the procedure [facility] tissue marker clip was deployed into the biopsy cavity. Follow up 2 view mammogram was performed and dictated separately. IMPRESSION: Ultrasound guided biopsy of 2 highly suspicious left breast masses and an abnormal left axillary lymph node. No apparent complications. Electronically Signed: By: Lajean Manes M.D. On: 01/23/2021 09:32   [place] LT BREAST BX W LOC DEV EA ADD LESION IMG BX SPEC US GUIDE  Addendum Date: 01/24/2021   ADDENDUM REPORT: 01/24/2021 11:57 ADDENDUM: PATHOLOGY revealed: Site A. LEFT BREAST, 2:30 11 CMFN; ULTRASOUND-GUIDED BIOPSY: - INVASIVE MAMMARY CARCINOMA, NO SPECIAL TYPE. Size of invasive carcinoma: 13 mm in this sample. Grade 3. Ductal carcinoma in situ: Present. Lymphovascular invasion: Not identified. Comment: The definitive grade will be assigned on the excisional specimen. ER/PR/HER2:Immunohistochemistry will be performed on block A1, with reflex to [place] for HER2 2+. The results will be reported in an addendum. Pathology results are CONCORDANT with imaging findings, per Dr. Lajean Manes. PATHOLOGY revealed: Site B. 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 Size of invasive  carcinoma: 4 mm in this sample. Grade 3. Ductal carcinoma in situ: Not identified. Lymphovascular invasion: Not identified. Comment: The definitive grade will be assigned on the excisional specimen. ER/PR/HER2 Immunohistochemistry will be performed on block B1, with reflex to [place] for HER2 2+. The results will be reported in an addendum. Pathology results are CONCORDANT with imaging findings, per Dr. Lajean Manes. PATHOLOGY revealed: Site C. 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 Lajean Manes. Pathology results and recommendations below were discussed with patient by telephone on 01/24/2021. Patient reported biopsy site within normal limits with slight tenderness at the site. Post biopsy care instructions were reviewed, questions were answered and my direct phone number was provided to patient. Patient was instructed to call [facility] if any concerns or questions arise  related to the biopsy. Recommendations: 1. Surgical and oncological consultation for all three sites above (A, B, and C). Request for surgical and oncological consultation relayed to Al Pimple RN and Tanya Nones RN at [facility] by Electa Sniff RN on 01/24/2021. 2. Consider bilateral breast MRI given the two areas of left malignancy may be part of a larger area of cancer throughout the lateral left breast. Pathology results reported by Electa Sniff RN on 01/24/2021. Electronically Signed   By: Lajean Manes M.D.   On: 01/24/2021 11:57   Result Date: 01/24/2021 CLINICAL DATA:  Patient presents for ultrasound-guided core needle biopsy of a 2.7 cm palpable mass in the upper outer left breast at 2:30 o'clock, a smaller mass with architectural distortion in the 4 o'clock retroareolar position of the left breast and an abnormal left axillary lymph node. EXAM: ULTRASOUND GUIDED LEFT  BREAST CORE NEEDLE BIOPSY: 2 BIOPSIES PERFORMED ULTRASOUND GUIDED LEFT AXILLARY LYMPH NODE CORE NEEDLE BIOPSY COMPARISON:  Previous exam(s). PROCEDURE: I met with the patient and we discussed the procedure of ultrasound-guided biopsy, including benefits and alternatives. We discussed the high likelihood of a successful procedure. We discussed the risks of the procedure, including infection, bleeding, tissue injury, clip migration, and inadequate sampling. Informed written consent was given. The usual time-out protocol was performed immediately prior to the procedure. Lesion #1: Mass, 2:30 o'clock, 11 cmfn. Lesion quadrant: Upper outer quadrant Using sterile technique and 1% Lidocaine as local anesthetic, under direct ultrasound visualization, a 12 gauge spring-loaded device was used to perform biopsy of the 2.7 cm irregular mass using a lateral approach. At the conclusion of the procedure heart shaped tissue marker clip was deployed into the biopsy cavity. Lesion #2: Mass, 4 o'clock, retroareolar. Lesion quadrant: Lower outer quadrant, retroareolar. Using sterile technique and 1% Lidocaine as local anesthetic, under direct ultrasound visualization, a 14 gauge spring-loaded device was used to perform biopsy of the smaller irregular mass in the 4 o'clock retroareolar left breast using a lateral approach. At the conclusion of the procedure vision tissue marker clip was deployed into the biopsy cavity. Lesion #3: Abnormal left axillary lymph node. Lesion location: Left axilla Using sterile technique and 1% Lidocaine as local anesthetic, under direct ultrasound visualization, a 14 gauge spring-loaded device was used to perform biopsy of the abnormal level 2 left axillary lymph node using a inferolateral approach. At the conclusion of the procedure [facility] tissue marker clip was deployed into the biopsy cavity. Follow up 2 view mammogram was performed and dictated separately. IMPRESSION: Ultrasound guided biopsy of 2  highly suspicious left breast masses and an abnormal left axillary lymph node. No apparent complications. Electronically Signed: By: Lajean Manes M.D. On: 01/23/2021 09:32   [place] LT BREAST BX W LOC DEV EA ADD LESION IMG BX SPEC US GUIDE  Addendum Date: 01/24/2021   ADDENDUM REPORT: 01/24/2021 11:57 ADDENDUM: PATHOLOGY revealed: Site A. LEFT BREAST, 2:30 11 CMFN; ULTRASOUND-GUIDED BIOPSY: - INVASIVE MAMMARY CARCINOMA, NO SPECIAL TYPE. Size of invasive carcinoma: 13 mm in this sample. Grade 3. Ductal carcinoma in situ: Present. Lymphovascular invasion: Not identified. Comment: The definitive grade will be assigned on the excisional specimen. ER/PR/HER2:Immunohistochemistry will be performed on block A1, with reflex to [place] for HER2 2+. The results will be reported in an addendum. Pathology results are CONCORDANT with imaging findings, per Dr. Lajean Manes. PATHOLOGY revealed: Site B. 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 Size of invasive carcinoma: 4 mm  in this sample. Grade 3. Ductal carcinoma in situ: Not identified. Lymphovascular invasion: Not identified. Comment: The definitive grade will be assigned on the excisional specimen. ER/PR/HER2 Immunohistochemistry will be performed on block B1, with reflex to [place] for HER2 2+. The results will be reported in an addendum. Pathology results are CONCORDANT with imaging findings, per Dr. Lajean Manes. PATHOLOGY revealed: Site C. 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 Lajean Manes. Pathology results and recommendations below were discussed with patient by telephone on 01/24/2021. Patient reported biopsy site within normal limits with slight tenderness at the site. Post biopsy care instructions were reviewed, questions were answered and my direct phone  number was provided to patient. Patient was instructed to call [facility] if any concerns or questions arise related to the biopsy. Recommendations: 1. Surgical and oncological consultation for all three sites above (A, B, and C). Request for surgical and oncological consultation relayed to Al Pimple RN and Tanya Nones RN at [facility] by Electa Sniff RN on 01/24/2021. 2. Consider bilateral breast MRI given the two areas of left malignancy may be part of a larger area of cancer throughout the lateral left breast. Pathology results reported by Electa Sniff RN on 01/24/2021. Electronically Signed   By: Lajean Manes M.D.   On: 01/24/2021 11:57   Result Date: 01/24/2021 CLINICAL DATA:  Patient presents for ultrasound-guided core needle biopsy of a 2.7 cm palpable mass in the upper outer left breast at 2:30 o'clock, a smaller mass with architectural distortion in the 4 o'clock retroareolar position of the left breast and an abnormal left axillary lymph node. EXAM: ULTRASOUND GUIDED LEFT BREAST CORE NEEDLE BIOPSY: 2 BIOPSIES PERFORMED ULTRASOUND GUIDED LEFT AXILLARY LYMPH NODE CORE NEEDLE BIOPSY COMPARISON:  Previous exam(s). PROCEDURE: I met with the patient and we discussed the procedure of ultrasound-guided biopsy, including benefits and alternatives. We discussed the high likelihood of a successful procedure. We discussed the risks of the procedure, including infection, bleeding, tissue injury, clip migration, and inadequate sampling. Informed written consent was given. The usual time-out protocol was performed immediately prior to the procedure. Lesion #1: Mass, 2:30 o'clock, 11 cmfn. Lesion quadrant: Upper outer quadrant Using sterile technique and 1% Lidocaine as local anesthetic, under direct ultrasound visualization, a 12 gauge spring-loaded device was used to perform biopsy of the 2.7 cm irregular mass using a lateral approach. At the conclusion of the procedure heart shaped  tissue marker clip was deployed into the biopsy cavity. Lesion #2: Mass, 4 o'clock, retroareolar. Lesion quadrant: Lower outer quadrant, retroareolar. Using sterile technique and 1% Lidocaine as local anesthetic, under direct ultrasound visualization, a 14 gauge spring-loaded device was used to perform biopsy of the smaller irregular mass in the 4 o'clock retroareolar left breast using a lateral approach. At the conclusion of the procedure vision tissue marker clip was deployed into the biopsy cavity. Lesion #3: Abnormal left axillary lymph node. Lesion location: Left axilla Using sterile technique and 1% Lidocaine as local anesthetic, under direct ultrasound visualization, a 14 gauge spring-loaded device was used to perform biopsy of the abnormal level 2 left axillary lymph node using a inferolateral approach. At the conclusion of the procedure [facility] tissue marker clip was deployed into the biopsy cavity. Follow up 2 view mammogram was performed and dictated separately. IMPRESSION: Ultrasound guided biopsy of 2 highly suspicious left breast masses and an abnormal left axillary  lymph node. No apparent complications. Electronically Signed: By: Lajean Manes M.D. 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  PLAN:    1. 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  Case also discussed with surgery.  Patient has expressed interest in prophylactic bilateral mastectomy, but will need to be evaluated by anesthesia to see if she can tolerate that surgery given her EF of 25%.  Although the risk is minimal, she did not wish to undergo adjuvant XRT.  Chemotherapy will likely be necessary, but patient is hesitant to proceed also given her cardiac status.  Will await final staging as well as ER/PR/HER2 status prior to making a final  decision.  Follow-up to be determined.  2.  Cardiomyopathy: Patient's most recent EF was reported 25%.  Continue close follow-up with cardiology.  I spent a total of 60 minutes reviewing chart data, face-to-face evaluation with the patient, counseling and coordination of care as detailed above.  Patient expressed understanding and was in agreement with this plan. She also understands that She can call clinic at any time with any questions, concerns, or complaints.   Cancer Staging Carcinoma of breast, female, left [facility]) Staging form: Breast, AJCC 8th Edition - Clinical stage from 01/24/2021: cT3, cN1, cM0, G3 - Signed by Lloyd Huger, MD on 01/24/2021 Histologic grading system: 3 grade system   Lloyd Huger, MD   02/01/2021 6:29 AM

## 2021-01-29 ENCOUNTER — Other Ambulatory Visit: Payer: Self-pay | Admitting: General Surgery

## 2021-01-29 DIAGNOSIS — C50412 Malignant neoplasm of upper-outer quadrant of left female breast: Secondary | ICD-10-CM

## 2021-01-31 ENCOUNTER — Inpatient Hospital Stay: Payer: 59 | Attending: Oncology | Admitting: Oncology

## 2021-01-31 ENCOUNTER — Inpatient Hospital Stay: Payer: 59

## 2021-01-31 ENCOUNTER — Encounter: Payer: Self-pay | Admitting: Oncology

## 2021-01-31 ENCOUNTER — Encounter: Payer: Self-pay | Admitting: *Deleted

## 2021-01-31 ENCOUNTER — Other Ambulatory Visit: Payer: Self-pay

## 2021-01-31 VITALS — BP 128/71 | HR 74 | Temp 98.1°F | Ht 63.0 in | Wt 207.6 lb

## 2021-01-31 DIAGNOSIS — Z803 Family history of malignant neoplasm of breast: Secondary | ICD-10-CM

## 2021-01-31 DIAGNOSIS — C50912 Malignant neoplasm of unspecified site of left female breast: Secondary | ICD-10-CM

## 2021-01-31 DIAGNOSIS — Z87891 Personal history of nicotine dependence: Secondary | ICD-10-CM | POA: Diagnosis not present

## 2021-01-31 DIAGNOSIS — C50412 Malignant neoplasm of upper-outer quadrant of left female breast: Secondary | ICD-10-CM | POA: Insufficient documentation

## 2021-01-31 DIAGNOSIS — I429 Cardiomyopathy, unspecified: Secondary | ICD-10-CM | POA: Diagnosis not present

## 2021-01-31 NOTE — Progress Notes (Signed)
Met patient and her husband today during her initial medical oncology consult with Dr. Grayland Ormond.  ER/PR and Her2 are still pending.  Patient is concerned about treatment and her cardiac function.  States her EF is 25%.  Final treatment plan will be discussed after molecular studies are complete and Dr. Bary Castilla has discussed surgery with her cardiologist and anesthesia.  Patient is leaning toward bilateral mastectomy with reconstruction.  She is to call next week for final path and I will set up next appointment.  Gave patient breast cancer educational literature, "My Breast Cancer Treatment Handbook" by Josephine Igo, RN.

## 2021-02-01 LAB — SURGICAL PATHOLOGY

## 2021-02-04 ENCOUNTER — Telehealth: Payer: Self-pay | Admitting: Surgery

## 2021-02-04 ENCOUNTER — Telehealth: Payer: Self-pay

## 2021-02-04 NOTE — Telephone Encounter (Signed)
Patient is calling and is asking for one of the nurses to give a call back. Please call patient and advise.

## 2021-02-04 NOTE — Progress Notes (Signed)
Patient called on Friday 02/01/21 requesting lab results.  Called with ER/PR positive HER2 positive.  Sent message to Dr. Grayland Ormond to schedule patient follow-up.

## 2021-02-04 NOTE — Telephone Encounter (Signed)
Laparoscopic Cholecystectomy 01/2019- pace maker- recently diagnosed with breast cancer -she has been assigned to Dr.Byrnett -she has questions regarding if her heart is strong enough for her to be scheduled for mastectomy- she has seen Dr.Byrnett already-

## 2021-02-06 ENCOUNTER — Encounter: Payer: Self-pay | Admitting: *Deleted

## 2021-02-06 NOTE — Progress Notes (Signed)
Called patient today per request of Dr. Grayland Ormond to see if patient wants to come in for a discussion of her ER/PR and Her2 results and probable chemo or wait until after she has seen Dr. Marla Roe.  Patient want's all the docs to have talked and tell her if her heart is ok for surgery and chemo.  Case Conference scheduled for Monday 02/11/21.  Dr. Grayland Ormond offered to have a video chat with patient on Tuesday afternoon after she see's Dr. Marla Roe.  Patient is agreeable to the video chat.

## 2021-02-08 NOTE — Progress Notes (Signed)
[place]  Telephone:([phone]) [phone] Fax:(336) [phone]  ID: Erika Rios OB: 1961-05-22  MR#: [id number]  VQM#:[id number]  Patient Care Team: Tracie Harrier, MD as PCP - General (Internal Medicine) Rico Junker, RN as Oncology Nurse Navigator  I connected with Erika Rios on 02/08/21 at  3:30 PM EDT by video enabled telemedicine visit and verified that I am speaking with the correct person using two identifiers.   I discussed the limitations, risks, security and privacy concerns of performing an evaluation and management service by telemedicine and the availability of in-person appointments. I also discussed with the patient that there may be a patient responsible charge related to this service. The patient expressed understanding and agreed to proceed.   Other persons participating in the visit and their role in the encounter: Patient, MD.  Patient's location: Home. Provider's location: Clinic.  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  INTERVAL HISTORY: Patient agreed to video assisted telemedicine visit to discuss her final staging results and additional treatment planning.  Her appointment with plastic surgery was canceled today secondary to undergoing cardiac echo.  She continues to feel well and remains asymptomatic. She has no neurologic complaints.  She denies any recent fevers or illnesses.  She has a good appetite and denies weight loss.  She has no chest pain, shortness of breath, cough, or hemoptysis.  She denies any nausea, vomiting, constipation, or diarrhea.  She has no urinary complaints.  Patient offers no specific complaints today.  REVIEW OF SYSTEMS:   Review of Systems  Constitutional: Negative.  Negative for fever, malaise/fatigue and weight loss.  Respiratory: Negative.  Negative for cough, hemoptysis and shortness of breath.   Cardiovascular: Negative.   Negative for chest pain and leg swelling.  Gastrointestinal: Negative.  Negative for abdominal pain.  Genitourinary: Negative.  Negative for frequency.  Musculoskeletal: Negative.  Negative for back pain.  Skin: Negative.  Negative for rash.  Neurological: Negative.  Negative for dizziness, focal weakness, weakness and headaches.  Psychiatric/Behavioral: Negative.  The patient is not nervous/anxious.    As per HPI. Otherwise, a complete review of systems is negative.  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 NEGATIVE   CARDIAC CATHETERIZATION N/A 02/24/2016   Procedure: Left Heart Cath and Coronary Angiography;  Surgeon: Troy Sine, MD;  Location: [facility];  Service: Cardiovascular;  Laterality: N/A;   CARDIAC CATHETERIZATION N/A 02/24/2016   Procedure: Coronary Stent Intervention;  Surgeon: Troy Sine, MD;  Location: [facility];  Service: Cardiovascular;  Laterality: N/A;   CHOLECYSTECTOMY N/A 02/02/2019   Procedure: LAPAROSCOPIC CHOLECYSTECTOMY - LATEX ALLERGY;  Surgeon: Olean Ree, MD;  Location: [facility];  Service: General;  Laterality: N/A;   IMPLANTABLE CARDIOVERTER DEFIBRILLATOR (ICD) GENERATOR CHANGE Left 10/22/2018   Procedure: ICD GENERATOR INSERTION;  Surgeon: Marcello Moores,  Priscille Heidelberg, MD;  Location: [facility];  Service: Cardiovascular;  Laterality: Left;   IR BILIARY DRAIN PLACEMENT WITH  CHOLANGIOGRAM      FAMILY HISTORY: Family History  Problem Relation Age of Onset   Cancer Mother    Heart disease Mother    Heart attack Mother    Breast cancer Mother    Diabetes Father     ADVANCED DIRECTIVES (Y/N):  N  HEALTH MAINTENANCE: Social History   Tobacco Use   Smoking status: Former    Packs/day: 0.50    Years: 34.00    Pack years: 17.00    Types: Cigarettes    Quit date: 03/19/2016    Years since quitting: 4.8   Smokeless tobacco: Never  Vaping Use   Vaping Use: Never used  Substance Use Topics   Alcohol use: Yes    Alcohol/week: 0.0 standard drinks    Comment: occ   Drug use: No     Colonoscopy:  PAP:  Bone density:  Lipid panel:  Allergies  Allergen Reactions   Statins Rash and Other (See Comments)    Hip pain, constant    Elastic Bandages & [Zinc] Rash    Paper tape should be okay   Iron Nausea And Vomiting   Latex Dermatitis   Nsaids     Told not to take d/t heart status   Oxycodone Anxiety    Current Outpatient Medications  Medication Sig Dispense Refill   acetaminophen (TYLENOL) 500 MG tablet Take 2 tablets (1,000 mg total) by mouth every 6 (six) hours as needed for mild pain, moderate pain or headache. 30 tablet 0   aspirin EC 325 MG EC tablet Take 1 tablet (325 mg total) by mouth 2 (two) times daily. (Patient taking differently: Take 325 mg by mouth daily. Once a day in the morning) 30 tablet 0   carvedilol (COREG) 3.125 MG tablet Take 3.125 mg by mouth 2 (two) times daily with a meal.     HYDROmorphone (DILAUDID) 2 MG tablet Take 2 mg by mouth 3 (three) times daily as needed for severe pain.  (Patient not taking: Reported on 01/31/2021)     ibuprofen (ADVIL) 200 MG tablet Take 600 mg by mouth every 6 (six) hours as needed for headache or moderate pain.     lisinopril (PRINIVIL,ZESTRIL) 2.5 MG tablet Take 1 tablet (2.5 mg total) by mouth daily. PLEASE CONTACT OFFICE FOR ADDITIONAL REFILLS (Patient taking differently: Take 2.5 mg by mouth  daily. In the morning) 30 tablet 0   miconazole (MICOTIN) 2 % powder Apply 1 application topically as needed for itching.     nitroGLYCERIN (NITROSTAT) 0.4 MG SL tablet PLACE 1 TABLET UNDER TONGUE EVERY 5 MINUTES IF NEEDED FOR CHEST PAIN UP TO 3 DOSES (Patient taking differently: Place 0.4 mg under the tongue every 5 (five) minutes as needed for chest pain.) 25 tablet 5   pantoprazole (PROTONIX) 40 MG tablet Take 1 tablet (40 mg total) by mouth 2 (two) times daily before a meal. (Patient not taking: Reported on 01/31/2021) 60 tablet 0   Potassium 99 MG TABS Take 99 mg by mouth daily.     sodium chloride 0.9 % injection 5 mLs by Intracatheter route daily. Flush drain with 5 ml normal saline once daily.  Do not aspirate. (Patient not taking: Reported on 01/31/2021) 150 mL 0   sodium chloride flush (NS) 0.9 % SOLN Flush drain with 68m normal saline once daily. Do not aspirate. (Patient not taking: Reported on 01/31/2021)  200 mL 0   spironolactone (ALDACTONE) 25 MG tablet Take 0.5 tablets (12.5 mg total) by mouth daily. (Patient taking differently: Take 12.5 mg by mouth every evening.) 30 tablet 6   triamcinolone cream (KENALOG) 0.1 % Apply 1 application topically daily as needed (rash).   3   No current facility-administered medications for this visit.    OBJECTIVE: There were no vitals filed for this visit.    There is no height or weight on file to calculate BMI.    ECOG FS:0 - Asymptomatic  General: Well-developed, well-nourished, no acute distress. HEENT: Normocephalic. Neuro: Alert, answering all questions appropriately. Cranial nerves grossly intact. Psych: Normal affect.   LAB RESULTS:  Lab Results  Component Value Date   NA 137 02/02/2019   K 3.8 02/02/2019   CL 108 02/02/2019   CO2 21 (L) 02/02/2019   GLUCOSE 96 02/02/2019   BUN 13 02/02/2019   CREATININE 0.70 02/02/2019   CALCIUM 8.8 (L) 02/02/2019   PROT 6.8 02/02/2019   ALBUMIN 3.7 02/02/2019   AST 20 02/02/2019   ALT 25  02/02/2019   ALKPHOS 88 02/02/2019   BILITOT 0.5 02/02/2019   GFRNONAA >60 02/02/2019   GFRAA >60 02/02/2019    Lab Results  Component Value Date   WBC 9.0 02/02/2019   NEUTROABS 5.2 02/02/2019   HGB 12.7 02/02/2019   HCT 37.8 02/02/2019   MCV 99.0 02/02/2019   PLT 240 02/02/2019     STUDIES: US BREAST LTD UNI LEFT INC AXILLA  Result Date: 01/14/2021 CLINICAL DATA:  Palpable lump in the left breast EXAM: DIGITAL DIAGNOSTIC BILATERAL MAMMOGRAM WITH TOMOSYNTHESIS AND CAD; ULTRASOUND LEFT BREAST LIMITED TECHNIQUE: Bilateral digital diagnostic mammography and breast tomosynthesis was performed. The images were evaluated with computer-aided detection.; Targeted ultrasound examination of the left breast was performed COMPARISON:  Previous exam(s). ACR Breast Density Category b: There are scattered areas of fibroglandular density. FINDINGS: Previously biopsied calcifications in the medial central right breast at a posterior depth are stable based on magnified views. No other suspicious findings or changes in the right breast. Calcifications in the medial left breast determined to be probably benign in 2018 are stable, consistent with benign calcifications. There is new distortion containing calcifications in the posterolateral left breast. 9.7 cm anterior to this distortion is a small spiculated mass in the slightly lateral subareolar left breast. There are calcifications between the posterior distortion and the anterior spiculated mass. On physical exam, a lump is felt in the posterolateral left breast. Targeted ultrasound is performed, showing an irregular mass containing calcifications in the left breast at 2:30, 11 cm from the nipple measuring at least 2.1 x 1.2 by 2.7 cm. Ultrasound probably underestimates the size of this mass. There is a small spiculated mass in the left retroareolar region at 4 o'clock measuring 10 x 9 x 14 mm, correlating with the small spiculated mass seen in this location  mammographically. There is an abnormal lymph node in the left axilla with a cortex measuring 7 mm. IMPRESSION: Highly suspicious masslike distortion in the posterolateral left breast. Highly suspicious small spiculated mass in the left slightly lateral retroareolar region. Suspicious calcifications spanning the lateral left breast between the distortion and the spiculated mass. Taken in total, these abnormalities span 12.2 cm. Suspicious left axillary lymph node. Benign medial calcifications seen bilaterally. RECOMMENDATION: Recommend ultrasound-guided biopsy of the masslike distortion in the left breast at 2:30, 11 cm from the nipple. Recommend ultrasound-guided biopsy of the small spiculated mass in the left  retroareolar region at 4 o'clock. Recommend ultrasound-guided biopsy of the abnormal left axillary lymph node. If the biopsies are malignant, and breast conservation is being considered, recommend breast MRI as imaging may underestimate the span of involvement. Taken together, the posterolateral distortion, the anterior small spiculated mass, and the calcifications between the masses span 12.2 cm. If breast conservation is still being considered after breast MRI, recommend biopsying the calcifications between the distortion and the spiculated mass to determine whether the calcifications are part of the malignant process. I have discussed the findings and recommendations with the patient. If applicable, a reminder letter will be sent to the patient regarding the next appointment. BI-RADS CATEGORY  5: Highly suggestive of malignancy. Electronically Signed   By: Dorise Bullion III M.D   On: 01/14/2021 16:17   MM DIAG BREAST TOMO BILATERAL  Result Date: 01/14/2021 CLINICAL DATA:  Palpable lump in the left breast EXAM: DIGITAL DIAGNOSTIC BILATERAL MAMMOGRAM WITH TOMOSYNTHESIS AND CAD; ULTRASOUND LEFT BREAST LIMITED TECHNIQUE: Bilateral digital diagnostic mammography and breast tomosynthesis was performed. The  images were evaluated with computer-aided detection.; Targeted ultrasound examination of the left breast was performed COMPARISON:  Previous exam(s). ACR Breast Density Category b: There are scattered areas of fibroglandular density. FINDINGS: Previously biopsied calcifications in the medial central right breast at a posterior depth are stable based on magnified views. No other suspicious findings or changes in the right breast. Calcifications in the medial left breast determined to be probably benign in 2018 are stable, consistent with benign calcifications. There is new distortion containing calcifications in the posterolateral left breast. 9.7 cm anterior to this distortion is a small spiculated mass in the slightly lateral subareolar left breast. There are calcifications between the posterior distortion and the anterior spiculated mass. On physical exam, a lump is felt in the posterolateral left breast. Targeted ultrasound is performed, showing an irregular mass containing calcifications in the left breast at 2:30, 11 cm from the nipple measuring at least 2.1 x 1.2 by 2.7 cm. Ultrasound probably underestimates the size of this mass. There is a small spiculated mass in the left retroareolar region at 4 o'clock measuring 10 x 9 x 14 mm, correlating with the small spiculated mass seen in this location mammographically. There is an abnormal lymph node in the left axilla with a cortex measuring 7 mm. IMPRESSION: Highly suspicious masslike distortion in the posterolateral left breast. Highly suspicious small spiculated mass in the left slightly lateral retroareolar region. Suspicious calcifications spanning the lateral left breast between the distortion and the spiculated mass. Taken in total, these abnormalities span 12.2 cm. Suspicious left axillary lymph node. Benign medial calcifications seen bilaterally. RECOMMENDATION: Recommend ultrasound-guided biopsy of the masslike distortion in the left breast at 2:30, 11  cm from the nipple. Recommend ultrasound-guided biopsy of the small spiculated mass in the left retroareolar region at 4 o'clock. Recommend ultrasound-guided biopsy of the abnormal left axillary lymph node. If the biopsies are malignant, and breast conservation is being considered, recommend breast MRI as imaging may underestimate the span of involvement. Taken together, the posterolateral distortion, the anterior small spiculated mass, and the calcifications between the masses span 12.2 cm. If breast conservation is still being considered after breast MRI, recommend biopsying the calcifications between the distortion and the spiculated mass to determine whether the calcifications are part of the malignant process. I have discussed the findings and recommendations with the patient. If applicable, a reminder letter will be sent to the patient regarding the next appointment. BI-RADS CATEGORY  5: Highly suggestive of malignancy. Electronically Signed   By: Dorise Bullion III M.D   On: 01/14/2021 16:17   MM CLIP PLACEMENT LEFT  Result Date: 01/23/2021 CLINICAL DATA:  Evaluate post biopsy marker clips following ultrasound-guided core needle biopsy of 2 left breast masses and an abnormal left axillary lymph node. The axillary lymph node was deep and not able to be visualized in the mammographic field of view. EXAM: DIAGNOSTIC LEFT MAMMOGRAM POST ULTRASOUND BIOPSY COMPARISON:  Previous exam(s). FINDINGS: Mammographic images were obtained following ultrasound guided biopsy of 2 left breast masses and an abnormal left axillary lymph node. The heart shaped biopsy clip lies within the irregular mass in the upper outer left breast, the vision clip lies in the retroareolar left breast, lower outer quadrant, and the HydroMARK clip in the axillary lymph node could not be visualized, but was well seen deploying within the abnormal lymph node sonographically. IMPRESSION: Appropriate positioning of the heart shaped biopsy  marking clip and vision biopsy marking clip at the site of biopsy in the left breast as detailed above. Final Assessment: Post Procedure Mammograms for Marker Placement Electronically Signed   By: Lajean Manes M.D.   On: 01/23/2021 09:38   [place] LT BREAST BX W LOC DEV 1ST LESION IMG BX SPEC US GUIDE  Addendum Date: 01/24/2021   ADDENDUM REPORT: 01/24/2021 11:57 ADDENDUM: PATHOLOGY revealed: Site A. LEFT BREAST, 2:30 11 CMFN; ULTRASOUND-GUIDED BIOPSY: - INVASIVE MAMMARY CARCINOMA, NO SPECIAL TYPE. Size of invasive carcinoma: 13 mm in this sample. Grade 3. Ductal carcinoma in situ: Present. Lymphovascular invasion: Not identified. Comment: The definitive grade will be assigned on the excisional specimen. ER/PR/HER2:Immunohistochemistry will be performed on block A1, with reflex to [place] for HER2 2+. The results will be reported in an addendum. Pathology results are CONCORDANT with imaging findings, per Dr. Lajean Manes. PATHOLOGY revealed: Site B. 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 Size of invasive carcinoma: 4 mm in this sample. Grade 3. Ductal carcinoma in situ: Not identified. Lymphovascular invasion: Not identified. Comment: The definitive grade will be assigned on the excisional specimen. ER/PR/HER2 Immunohistochemistry will be performed on block B1, with reflex to [place] for HER2 2+. The results will be reported in an addendum. Pathology results are CONCORDANT with imaging findings, per Dr. Lajean Manes. PATHOLOGY revealed: Site C. 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 Lajean Manes. Pathology results and recommendations below were discussed with patient by telephone on 01/24/2021. Patient reported biopsy site within normal limits with slight tenderness at the site. Post biopsy care  instructions were reviewed, questions were answered and my direct phone number was provided to patient. Patient was instructed to call [facility] if any concerns or questions arise related to the biopsy. Recommendations: 1. Surgical and oncological consultation for all three sites above (A, B, and C). Request for surgical and oncological consultation relayed to Al Pimple RN and Tanya Nones RN at [facility] by Electa Sniff RN on 01/24/2021. 2. Consider bilateral breast MRI given the two areas of left malignancy may be part of a larger area of cancer throughout the lateral left breast. Pathology results reported by Electa Sniff RN on 01/24/2021. Electronically Signed   By: Lajean Manes M.D.   On: 01/24/2021 11:57   Result Date: 01/24/2021 CLINICAL DATA:  Patient presents for ultrasound-guided core needle biopsy of  a 2.7 cm palpable mass in the upper outer left breast at 2:30 o'clock, a smaller mass with architectural distortion in the 4 o'clock retroareolar position of the left breast and an abnormal left axillary lymph node. EXAM: ULTRASOUND GUIDED LEFT BREAST CORE NEEDLE BIOPSY: 2 BIOPSIES PERFORMED ULTRASOUND GUIDED LEFT AXILLARY LYMPH NODE CORE NEEDLE BIOPSY COMPARISON:  Previous exam(s). PROCEDURE: I met with the patient and we discussed the procedure of ultrasound-guided biopsy, including benefits and alternatives. We discussed the high likelihood of a successful procedure. We discussed the risks of the procedure, including infection, bleeding, tissue injury, clip migration, and inadequate sampling. Informed written consent was given. The usual time-out protocol was performed immediately prior to the procedure. Lesion #1: Mass, 2:30 o'clock, 11 cmfn. Lesion quadrant: Upper outer quadrant Using sterile technique and 1% Lidocaine as local anesthetic, under direct ultrasound visualization, a 12 gauge spring-loaded device was used to perform biopsy of the 2.7 cm irregular mass  using a lateral approach. At the conclusion of the procedure heart shaped tissue marker clip was deployed into the biopsy cavity. Lesion #2: Mass, 4 o'clock, retroareolar. Lesion quadrant: Lower outer quadrant, retroareolar. Using sterile technique and 1% Lidocaine as local anesthetic, under direct ultrasound visualization, a 14 gauge spring-loaded device was used to perform biopsy of the smaller irregular mass in the 4 o'clock retroareolar left breast using a lateral approach. At the conclusion of the procedure vision tissue marker clip was deployed into the biopsy cavity. Lesion #3: Abnormal left axillary lymph node. Lesion location: Left axilla Using sterile technique and 1% Lidocaine as local anesthetic, under direct ultrasound visualization, a 14 gauge spring-loaded device was used to perform biopsy of the abnormal level 2 left axillary lymph node using a inferolateral approach. At the conclusion of the procedure [facility] tissue marker clip was deployed into the biopsy cavity. Follow up 2 view mammogram was performed and dictated separately. IMPRESSION: Ultrasound guided biopsy of 2 highly suspicious left breast masses and an abnormal left axillary lymph node. No apparent complications. Electronically Signed: By: Lajean Manes M.D. On: 01/23/2021 09:32   [place] LT BREAST BX W LOC DEV EA ADD LESION IMG BX SPEC US GUIDE  Addendum Date: 01/24/2021   ADDENDUM REPORT: 01/24/2021 11:57 ADDENDUM: PATHOLOGY revealed: Site A. LEFT BREAST, 2:30 11 CMFN; ULTRASOUND-GUIDED BIOPSY: - INVASIVE MAMMARY CARCINOMA, NO SPECIAL TYPE. Size of invasive carcinoma: 13 mm in this sample. Grade 3. Ductal carcinoma in situ: Present. Lymphovascular invasion: Not identified. Comment: The definitive grade will be assigned on the excisional specimen. ER/PR/HER2:Immunohistochemistry will be performed on block A1, with reflex to [place] for HER2 2+. The results will be reported in an addendum. Pathology results are CONCORDANT with imaging  findings, per Dr. Lajean Manes. PATHOLOGY revealed: Site B. 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 Size of invasive carcinoma: 4 mm in this sample. Grade 3. Ductal carcinoma in situ: Not identified. Lymphovascular invasion: Not identified. Comment: The definitive grade will be assigned on the excisional specimen. ER/PR/HER2 Immunohistochemistry will be performed on block B1, with reflex to [place] for HER2 2+. The results will be reported in an addendum. Pathology results are CONCORDANT with imaging findings, per Dr. Lajean Manes. PATHOLOGY revealed: Site C. 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 Lajean Manes. Pathology results and recommendations below were discussed with patient by telephone on 01/24/2021. Patient reported biopsy site within normal  limits with slight tenderness at the site. Post biopsy care instructions were reviewed, questions were answered and my direct phone number was provided to patient. Patient was instructed to call [facility] if any concerns or questions arise related to the biopsy. Recommendations: 1. Surgical and oncological consultation for all three sites above (A, B, and C). Request for surgical and oncological consultation relayed to Al Pimple RN and Tanya Nones RN at [facility] by Electa Sniff RN on 01/24/2021. 2. Consider bilateral breast MRI given the two areas of left malignancy may be part of a larger area of cancer throughout the lateral left breast. Pathology results reported by Electa Sniff RN on 01/24/2021. Electronically Signed   By: Lajean Manes M.D.   On: 01/24/2021 11:57   Result Date: 01/24/2021 CLINICAL DATA:  Patient presents for ultrasound-guided core needle biopsy of a 2.7 cm palpable mass in the upper outer left  breast at 2:30 o'clock, a smaller mass with architectural distortion in the 4 o'clock retroareolar position of the left breast and an abnormal left axillary lymph node. EXAM: ULTRASOUND GUIDED LEFT BREAST CORE NEEDLE BIOPSY: 2 BIOPSIES PERFORMED ULTRASOUND GUIDED LEFT AXILLARY LYMPH NODE CORE NEEDLE BIOPSY COMPARISON:  Previous exam(s). PROCEDURE: I met with the patient and we discussed the procedure of ultrasound-guided biopsy, including benefits and alternatives. We discussed the high likelihood of a successful procedure. We discussed the risks of the procedure, including infection, bleeding, tissue injury, clip migration, and inadequate sampling. Informed written consent was given. The usual time-out protocol was performed immediately prior to the procedure. Lesion #1: Mass, 2:30 o'clock, 11 cmfn. Lesion quadrant: Upper outer quadrant Using sterile technique and 1% Lidocaine as local anesthetic, under direct ultrasound visualization, a 12 gauge spring-loaded device was used to perform biopsy of the 2.7 cm irregular mass using a lateral approach. At the conclusion of the procedure heart shaped tissue marker clip was deployed into the biopsy cavity. Lesion #2: Mass, 4 o'clock, retroareolar. Lesion quadrant: Lower outer quadrant, retroareolar. Using sterile technique and 1% Lidocaine as local anesthetic, under direct ultrasound visualization, a 14 gauge spring-loaded device was used to perform biopsy of the smaller irregular mass in the 4 o'clock retroareolar left breast using a lateral approach. At the conclusion of the procedure vision tissue marker clip was deployed into the biopsy cavity. Lesion #3: Abnormal left axillary lymph node. Lesion location: Left axilla Using sterile technique and 1% Lidocaine as local anesthetic, under direct ultrasound visualization, a 14 gauge spring-loaded device was used to perform biopsy of the abnormal level 2 left axillary lymph node using a inferolateral approach. At the  conclusion of the procedure [facility] tissue marker clip was deployed into the biopsy cavity. Follow up 2 view mammogram was performed and dictated separately. IMPRESSION: Ultrasound guided biopsy of 2 highly suspicious left breast masses and an abnormal left axillary lymph node. No apparent complications. Electronically Signed: By: Lajean Manes M.D. On: 01/23/2021 09:32   [place] LT BREAST BX W LOC DEV EA ADD LESION IMG BX SPEC US GUIDE  Addendum Date: 01/24/2021   ADDENDUM REPORT: 01/24/2021 11:57 ADDENDUM: PATHOLOGY revealed: Site A. LEFT BREAST, 2:30 11 CMFN; ULTRASOUND-GUIDED BIOPSY: - INVASIVE MAMMARY CARCINOMA, NO SPECIAL TYPE. Size of invasive carcinoma: 13 mm in this sample. Grade 3. Ductal carcinoma in situ: Present. Lymphovascular invasion: Not identified. Comment: The definitive grade will be assigned on the excisional specimen. ER/PR/HER2:Immunohistochemistry will be performed on block A1, with reflex to [place] for HER2 2+. The results will be  reported in an addendum. Pathology results are CONCORDANT with imaging findings, per Dr. Lajean Manes. PATHOLOGY revealed: Site B. 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 Size of invasive carcinoma: 4 mm in this sample. Grade 3. Ductal carcinoma in situ: Not identified. Lymphovascular invasion: Not identified. Comment: The definitive grade will be assigned on the excisional specimen. ER/PR/HER2 Immunohistochemistry will be performed on block B1, with reflex to [place] for HER2 2+. The results will be reported in an addendum. Pathology results are CONCORDANT with imaging findings, per Dr. Lajean Manes. PATHOLOGY revealed: Site C. 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 Lajean Manes. Pathology results and recommendations below were discussed with patient  by telephone on 01/24/2021. Patient reported biopsy site within normal limits with slight tenderness at the site. Post biopsy care instructions were reviewed, questions were answered and my direct phone number was provided to patient. Patient was instructed to call [facility] if any concerns or questions arise related to the biopsy. Recommendations: 1. Surgical and oncological consultation for all three sites above (A, B, and C). Request for surgical and oncological consultation relayed to Al Pimple RN and Tanya Nones RN at [facility] by Electa Sniff RN on 01/24/2021. 2. Consider bilateral breast MRI given the two areas of left malignancy may be part of a larger area of cancer throughout the lateral left breast. Pathology results reported by Electa Sniff RN on 01/24/2021. Electronically Signed   By: Lajean Manes M.D.   On: 01/24/2021 11:57   Result Date: 01/24/2021 CLINICAL DATA:  Patient presents for ultrasound-guided core needle biopsy of a 2.7 cm palpable mass in the upper outer left breast at 2:30 o'clock, a smaller mass with architectural distortion in the 4 o'clock retroareolar position of the left breast and an abnormal left axillary lymph node. EXAM: ULTRASOUND GUIDED LEFT BREAST CORE NEEDLE BIOPSY: 2 BIOPSIES PERFORMED ULTRASOUND GUIDED LEFT AXILLARY LYMPH NODE CORE NEEDLE BIOPSY COMPARISON:  Previous exam(s). PROCEDURE: I met with the patient and we discussed the procedure of ultrasound-guided biopsy, including benefits and alternatives. We discussed the high likelihood of a successful procedure. We discussed the risks of the procedure, including infection, bleeding, tissue injury, clip migration, and inadequate sampling. Informed written consent was given. The usual time-out protocol was performed immediately prior to the procedure. Lesion #1: Mass, 2:30 o'clock, 11 cmfn. Lesion quadrant: Upper outer quadrant Using sterile technique and 1% Lidocaine as local anesthetic,  under direct ultrasound visualization, a 12 gauge spring-loaded device was used to perform biopsy of the 2.7 cm irregular mass using a lateral approach. At the conclusion of the procedure heart shaped tissue marker clip was deployed into the biopsy cavity. Lesion #2: Mass, 4 o'clock, retroareolar. Lesion quadrant: Lower outer quadrant, retroareolar. Using sterile technique and 1% Lidocaine as local anesthetic, under direct ultrasound visualization, a 14 gauge spring-loaded device was used to perform biopsy of the smaller irregular mass in the 4 o'clock retroareolar left breast using a lateral approach. At the conclusion of the procedure vision tissue marker clip was deployed into the biopsy cavity. Lesion #3: Abnormal left axillary lymph node. Lesion location: Left axilla Using sterile technique and 1% Lidocaine as local anesthetic, under direct ultrasound visualization, a 14 gauge spring-loaded device was used to perform biopsy of the abnormal level 2 left axillary lymph node using a inferolateral approach. At the conclusion  of the procedure [facility] tissue marker clip was deployed into the biopsy cavity. Follow up 2 view mammogram was performed and dictated separately. IMPRESSION: Ultrasound guided biopsy of 2 highly suspicious left breast masses and an abnormal left axillary lymph node. No apparent complications. Electronically Signed: By: Lajean Manes M.D. 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  PLAN:    1. 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  Case also discussed with surgery.  Patient wishes to undergo reconstruction, but had to delay her appointment with plastic surgery another 2 weeks secondary to conflict with her cardiac echo.  Patient continues to list to assess all of her cardioprotective  options prior to deciding on a treatment plan.  Given delay in her appointments, patient has agreed to initiate neoadjuvant letrozole while she is making a decision on treatment.  Have recommended chemotherapy likely using Taxotere plus carboplatinum or Taxotere plus Cytoxan.  Patient is HER2 positive and recommendations are to use Herceptin and Perjeta, but will need to discuss the risk and benefits with her cardiologist prior to initiating treatment.  Patient will have a video assisted telemedicine visit the week of July 5 for continued discussion of her treatment options.   2.  Cardiomyopathy: Patient's most recent EF was reported 25%.  S she underwent repeat cardiac echo earlier today and results are pending at time of dictation.  Patient also reports she is having a stress test next week with follow-up with her cardiologist soon thereafter.  I provided 30 minutes of face-to-face video visit time during this encounter which included chart review, counseling, and coordination of care as documented above.   Patient expressed understanding and was in agreement with this plan. She also understands that She can call clinic at any time with any questions, concerns, or complaints.   Cancer Staging Carcinoma of breast, female, left [facility]) Staging form: Breast, AJCC 8th Edition - Clinical stage from 01/24/2021: cT3, cN1, cM0, G3 - Signed by Lloyd Huger, MD on 01/24/2021 Histologic grading system: 3 grade system   Lloyd Huger, MD   02/08/2021 10:39 PM

## 2021-02-11 ENCOUNTER — Other Ambulatory Visit (HOSPITAL_COMMUNITY): Payer: Self-pay | Admitting: Physician Assistant

## 2021-02-11 ENCOUNTER — Other Ambulatory Visit: Payer: Self-pay | Admitting: Physician Assistant

## 2021-02-11 DIAGNOSIS — I255 Ischemic cardiomyopathy: Secondary | ICD-10-CM

## 2021-02-11 DIAGNOSIS — I5020 Unspecified systolic (congestive) heart failure: Secondary | ICD-10-CM

## 2021-02-12 ENCOUNTER — Inpatient Hospital Stay (HOSPITAL_BASED_OUTPATIENT_CLINIC_OR_DEPARTMENT_OTHER): Payer: 59 | Admitting: Oncology

## 2021-02-12 ENCOUNTER — Encounter: Payer: Self-pay | Admitting: Oncology

## 2021-02-12 ENCOUNTER — Institutional Professional Consult (permissible substitution): Payer: 59 | Admitting: Plastic Surgery

## 2021-02-12 DIAGNOSIS — Z170 Estrogen receptor positive status [ER+]: Secondary | ICD-10-CM

## 2021-02-12 DIAGNOSIS — C50912 Malignant neoplasm of unspecified site of left female breast: Secondary | ICD-10-CM

## 2021-02-12 MED ORDER — LETROZOLE 2.5 MG PO TABS: 2.5000 mg | ORAL_TABLET | Freq: Every day | ORAL | 3 refills | Status: AC

## 2021-02-12 NOTE — Progress Notes (Signed)
RN called pt, she was currently at facility to get sonogram done.  Pt verbalized understanding of video visit today at 330 and stated she would try and call to review information with nurse prior to visit.

## 2021-02-22 ENCOUNTER — Ambulatory Visit: Payer: 59

## 2021-02-22 ENCOUNTER — Other Ambulatory Visit: Payer: Self-pay

## 2021-02-22 ENCOUNTER — Ambulatory Visit
Admission: RE | Admit: 2021-02-22 | Discharge: 2021-02-22 | Disposition: A | Discharge status: Home / Self Care | Payer: 59 | Source: Ambulatory Visit | Attending: Physician Assistant | Admitting: Physician Assistant

## 2021-02-22 ENCOUNTER — Encounter: Payer: Self-pay | Admitting: *Deleted

## 2021-02-22 DIAGNOSIS — I5020 Unspecified systolic (congestive) heart failure: Secondary | ICD-10-CM | POA: Diagnosis present

## 2021-02-22 DIAGNOSIS — I255 Ischemic cardiomyopathy: Secondary | ICD-10-CM | POA: Insufficient documentation

## 2021-02-22 LAB — NM MYOCAR MULTI W/SPECT W/WALL MOTION / EF
Estimated workload: 1 METS
Exercise duration (min): 1 min
Exercise duration (sec): 4 s
LV dias vol: 205 mL (ref 46–106)
LV sys vol: 152 mL
MPHR: 90 {beats}/min
Peak HR: 90 {beats}/min
Percent HR: 55 %
Rest HR: 60 {beats}/min
SDS: 0
SRS: 38
SSS: 31
TID: 1.08

## 2021-02-22 MED ORDER — REGADENOSON 0.4 MG/5ML IV SOLN
0.4000 mg | Freq: Once | INTRAVENOUS | Status: AC
  Administered 2021-02-22: 0.4 mg via INTRAVENOUS

## 2021-02-22 MED ORDER — TECHNETIUM TC 99M TETROFOSMIN IV KIT
10.3900 | PACK | Freq: Once | INTRAVENOUS | Status: AC | PRN
  Administered 2021-02-22: 10.39 via INTRAVENOUS

## 2021-02-22 MED ORDER — TECHNETIUM TC 99M TETROFOSMIN IV KIT
31.2800 | PACK | Freq: Once | INTRAVENOUS | Status: AC | PRN
  Administered 2021-02-22: 31.28 via INTRAVENOUS

## 2021-02-22 NOTE — Progress Notes (Signed)
Called patient and informed her of her video visit with Dr Grayland Ormond on 03/05/21 @ 11:00.

## 2021-03-01 ENCOUNTER — Other Ambulatory Visit: Payer: Self-pay

## 2021-03-01 ENCOUNTER — Encounter: Payer: Self-pay | Admitting: Plastic Surgery

## 2021-03-01 ENCOUNTER — Ambulatory Visit (INDEPENDENT_AMBULATORY_CARE_PROVIDER_SITE_OTHER): Payer: 59 | Admitting: Plastic Surgery

## 2021-03-01 VITALS — BP 134/71 | HR 60 | Ht 63.0 in | Wt 210.4 lb

## 2021-03-01 DIAGNOSIS — C50912 Malignant neoplasm of unspecified site of left female breast: Secondary | ICD-10-CM

## 2021-03-01 NOTE — Progress Notes (Signed)
Patient ID: Erika Rios, female    DOB: 1960/10/29, 60 y.o.   MRN: [id number]   Chief Complaint  Patient presents with   Advice Only    The patient is a 60 year old female here for a consultation for breast reconstruction.  She noticed a mass in her left breast over the last several months and underwent a work-up May 2022 which revealed LEFT invasive mammary carcinoma.  She is 5 feet 3 inches tall and weighs 210 pounds.  Her preoperative bra size is a G cup.  She would like to be a D cup.  She has been seen by Dr. Bary Castilla.  Her mother is deceased and had breast cancer.  She is on aspirin and has a history of elevated blood sugars.  She also has heart disease including atherosclerosis and an ejection fraction of 25%.  She complained of abdominal pain in 2020 and was found to have severe cholecystitis and an intrahepatic abscess.  A percutaneous drain was done and she was treated with a laparoscopic cholecystectomy.  She has done well since then.  She is interested in reconstruction and inquired about autologous reconstruction.  She is done a little bit of reading but still has a lot of confusion about options.   Review of Systems  Constitutional: Negative.  Negative for activity change.  Eyes: Negative.  Negative for discharge.  Respiratory:  Negative for chest tightness and shortness of breath.   Cardiovascular:  Negative for leg swelling.  Gastrointestinal:  Negative for abdominal distention.  Endocrine: Negative.   Genitourinary: Negative.   Skin: Negative.   Neurological: Negative.   Hematological: Negative.   Psychiatric/Behavioral: Negative.     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 NEGATIVE   CARDIAC CATHETERIZATION N/A 02/24/2016   Procedure: Left Heart Cath and Coronary Angiography;  Surgeon: Troy Sine, MD;  Location: [place] [facility];  Service: Cardiovascular;  Laterality: N/A;   CARDIAC CATHETERIZATION N/A 02/24/2016   Procedure: Coronary Stent Intervention;  Surgeon: Troy Sine, MD;  Location: [place] [facility];  Service: Cardiovascular;  Laterality: N/A;   CHOLECYSTECTOMY N/A 02/02/2019   Procedure: LAPAROSCOPIC CHOLECYSTECTOMY - LATEX ALLERGY;  Surgeon: Olean Ree, MD;  Location: [facility];  Service: General;  Laterality: N/A;   IMPLANTABLE CARDIOVERTER DEFIBRILLATOR (ICD) GENERATOR CHANGE Left 10/22/2018   Procedure: ICD GENERATOR INSERTION;  Surgeon: Marzetta Board, MD;  Location: [facility];  Service: Cardiovascular;  Laterality: Left;   IR BILIARY DRAIN PLACEMENT WITH CHOLANGIOGRAM        Current Outpatient Medications:    acetaminophen (TYLENOL) 500 MG tablet, Take 2 tablets (1,000 mg total) by mouth every 6 (six) hours as needed for mild pain, moderate pain or headache., Disp: 30 tablet, Rfl: 0   aspirin EC 325 MG EC tablet, Take 1 tablet (325 mg total) by mouth 2 (two) times daily. (Patient taking differently: Take 325 mg by mouth  daily. Once a day in the morning), Disp: 30 tablet, Rfl: 0   carvedilol (COREG) 3.125 MG tablet, Take 3.125 mg by mouth 2 (two) times daily with a meal., Disp: , Rfl:    HYDROmorphone (DILAUDID) 2 MG tablet, Take 2 mg by mouth 3 (three) times daily as needed for severe pain., Disp: , Rfl:    ibuprofen (ADVIL) 200 MG tablet, Take 600 mg by mouth every 6 (six) hours as needed for headache or moderate pain., Disp: , Rfl:    letrozole (FEMARA) 2.5 MG tablet, Take 1 tablet (2.5 mg  total) by mouth daily., Disp: 90 tablet, Rfl: 3   lisinopril (PRINIVIL,ZESTRIL) 2.5 MG tablet, Take 1 tablet (2.5 mg total) by mouth daily. PLEASE CONTACT OFFICE FOR ADDITIONAL REFILLS (Patient taking differently: Take 2.5 mg by mouth daily. In the morning), Disp: 30 tablet, Rfl: 0   miconazole (MICOTIN) 2 % powder, Apply 1 application topically as needed for itching., Disp: , Rfl:    nitroGLYCERIN (NITROSTAT) 0.4 MG SL tablet, PLACE 1 TABLET UNDER TONGUE EVERY 5 MINUTES IF NEEDED FOR CHEST PAIN UP TO 3 DOSES (Patient taking differently: Place 0.4 mg under the tongue every 5 (five) minutes as needed for chest pain.), Disp: 25 tablet, Rfl: 5   pantoprazole (PROTONIX) 40 MG tablet, Take 1 tablet (40 mg total) by mouth 2 (two) times daily before a meal., Disp: 60 tablet, Rfl: 0   Potassium 99 MG TABS, Take 99 mg by mouth daily., Disp: , Rfl:    sodium chloride 0.9 % injection, 5 mLs by Intracatheter route daily. Flush drain with 5 ml normal saline once daily.  Do not aspirate. (Patient taking differently: 5 mLs by Intracatheter route daily. Flush drain with 5 ml normal saline once daily.  Do not aspirate.), Disp: 150 mL, Rfl: 0   sodium chloride flush (NS) 0.9 % SOLN, Flush drain with 34ml normal saline once daily. Do not aspirate., Disp: 200 mL, Rfl: 0   spironolactone (ALDACTONE) 25 MG tablet, Take 0.5 tablets (12.5 mg total) by mouth daily. (Patient taking differently: Take 12.5 mg by mouth every evening.), Disp: 30 tablet, Rfl: 6   triamcinolone cream (KENALOG) 0.1 %, Apply 1 application topically daily as needed (rash). , Disp: , Rfl: 3   Objective:   Vitals:   03/01/21 1156  BP: 134/71  Pulse: 60  SpO2: 96%    Physical Exam Vitals and nursing note reviewed.  Constitutional:      Appearance: Normal appearance.  HENT:     Head: Normocephalic and atraumatic.  Cardiovascular:     Rate and Rhythm: Normal rate.     Pulses: Normal pulses.  Pulmonary:     Effort: Pulmonary effort is normal.   Abdominal:     General: Abdomen is flat. There is no distension.  Musculoskeletal:        General: No swelling. Normal range of motion.  Skin:    General: Skin is warm.     Capillary Refill: Capillary refill takes less than 2 seconds.     Coloration: Skin is not jaundiced.     Findings: No bruising.  Neurological:     General: No focal deficit present.     Mental Status: She is alert.     Motor: No weakness.    Assessment & Plan:  Carcinoma of breast, female, left [facility])  The options for reconstruction we explained to the patient / family for breast reconstruction.  There are two general categories of reconstruction.  We can  reconstruction a breast with implants or use the patient's own tissue.  These were further discussed as listed.  Breast reconstruction is an optional procedure and eligibility depends on the full spectrum of the health of the patient and any co-morbidities.  More than one surgery is often needed to complete the reconstruction process.  The process can take three to twelve months to complete.  The breasts will not be identical due to many factors such as rib differences, shoulder asymmetry and treatments such as radiation.  The goal is to get the breasts to look normal and symmetrical in clothes.  Scars are a part of surgery and may fade some in time but will always be present under clothes.  Surgery may be an option on the non-cancer breast to achieve more symmetry.  No matter which procedure is chosen there is always the risk of complications and even failure of the body to heal.  This could result in no breast.    The options for reconstruction include:  1. Placement of a tissue expander with Acellular dermal matrix. When the expander is the desired size surgery is performed to remove the expander and place an implant.  In some cases the implant can be placed without an expander.  2. Autologous reconstruction can include using a muscle or tissue from another area of  the body to create a breast.  3. Combined procedures (ie. latissismus dorsi flap) can be done with an expander / implant placed under the muscle.   The risks, benefits, scars and recovery time were discussed for each of the above. Risks include bleeding, infection, hematoma, seroma, scarring, pain, wound healing complications, flap loss, fat necrosis, capsular contracture, need for implant removal, donor site complications, bulge, hernia, umbilical necrosis, need for urgent reoperation, and need for dressing changes.   The procedure the patient selected / that was best for the patient, was then discussed in further detail.  Total time: 45 minutes. This includes time spent with the patient during the visit as well as time spent before and after the visit reviewing the chart, documenting the encounter, making phone calls and reviewing studies.   I do not think that the patient has a great option for autologous reconstruction considering her previous surgeries and her heart condition as well as her overall health.  We discussed the possibility of bilateral breast reduction.  We also discussed a breast reduction on the noncancer side with doing nothing on the cancer side.  Another option is reduction on the noncancer side and implants on the cancer side.  Another option is expander implants on both sides.  I am very concerned about her overall health and her ability to grasp her options.  She did want to have her husband join [place] but was unable to get a hold of him.  I placed a call to Dr. Bary Castilla.  We will discuss this further.  I will also set up a telemetry visit to talk to her at home with her husband present.  I think this will be a great help.  Pictures were obtained of the patient and placed in the chart with the patient's or guardian's permission.   [place], DO

## 2021-03-02 NOTE — Progress Notes (Signed)
[place]  [phone] [phone]  ID: Erika Rios OB: December 26, 1960  MR#: [id number]  W[place]O#:[id number]  Patient Care Team: Tracie Harrier, MD as PCP - General (Internal Medicine) Rico Junker, RN as Oncology Nurse Navigator Rico Junker, RN as Oncology Nurse Navigator  I connected with Erika Rios on 03/05/21 at 11:00 AM EDT by video enabled telemedicine visit and verified that I am speaking with the correct person using two identifiers.   I discussed the limitations, risks, security and privacy concerns of performing an evaluation and management service by telemedicine and the availability of in-person appointments. I also discussed with the patient that there may be a patient responsible charge related to this service. The patient expressed understanding and agreed to proceed.   Other persons participating in the visit and their role in the encounter: Patient, MD.  Patient's location: Home. Provider's location: Clinic.  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  INTERVAL HISTORY: Patient agreed to video assisted telemedicine visit for further evaluation and treatment planning.  Patient states that she recently had consultation with plastic surgery and has decided to pursue simple lumpectomy without reconstruction.  She previously was placed on letrozole while making treatment decision and is tolerating this well only with occasional hot flashes.  She otherwise feels well. She has no neurologic complaints.  She denies any recent fevers or illnesses.  She has a good appetite and denies weight loss.  She has no chest pain, shortness of breath, cough, or hemoptysis.  She denies any nausea, vomiting, constipation, or diarrhea.  She has no urinary complaints.  Patient offers no further specific complaints today.  REVIEW OF SYSTEMS:   Review of Systems   Constitutional: Negative.  Negative for fever, malaise/fatigue and weight loss.  Respiratory: Negative.  Negative for cough, hemoptysis and shortness of breath.   Cardiovascular: Negative.  Negative for chest pain and leg swelling.  Gastrointestinal: Negative.  Negative for abdominal pain.  Genitourinary: Negative.  Negative for frequency.  Musculoskeletal: Negative.  Negative for back pain.  Skin: Negative.  Negative for rash.  Neurological: Negative.  Negative for dizziness, focal weakness, weakness and headaches.  Psychiatric/Behavioral: Negative.  The patient is not nervous/anxious.    As per HPI. Otherwise, a complete review of systems is negative.  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 NEGATIVE   CARDIAC CATHETERIZATION N/A 02/24/2016   Procedure: Left Heart Cath and Coronary Angiography;  Surgeon: Troy Sine, MD;  Location: [facility];  Service: Cardiovascular;  Laterality: N/A;   CARDIAC CATHETERIZATION N/A 02/24/2016   Procedure: Coronary Stent Intervention;  Surgeon: Troy Sine, MD;  Location: [facility];  Service: Cardiovascular;  Laterality: N/A;   CHOLECYSTECTOMY N/A 02/02/2019   Procedure: LAPAROSCOPIC CHOLECYSTECTOMY - LATEX ALLERGY;  Surgeon: Olean Ree, MD;  Location:  [facility];  Service: General;  Laterality: N/A;   IMPLANTABLE CARDIOVERTER DEFIBRILLATOR (ICD) GENERATOR  CHANGE Left 10/22/2018   Procedure: ICD GENERATOR INSERTION;  Surgeon: Marzetta Board, MD;  Location: [facility];  Service: Cardiovascular;  Laterality: Left;   IR BILIARY DRAIN PLACEMENT WITH CHOLANGIOGRAM      FAMILY HISTORY: Family History  Problem Relation Age of Onset   Cancer Mother    Heart disease Mother    Heart attack Mother    Breast cancer Mother    Diabetes Father     ADVANCED DIRECTIVES (Y/N):  N  HEALTH MAINTENANCE: Social History   Tobacco Use   Smoking status: Former    Packs/day: 0.50    Years: 34.00    Pack years: 17.00    Types: Cigarettes    Quit date: 03/19/2016    Years since quitting: 4.9   Smokeless tobacco: Never  Vaping Use   Vaping Use: Never used  Substance Use Topics   Alcohol use: Yes    Alcohol/week: 0.0 standard drinks    Comment: occ   Drug use: No     Colonoscopy:  PAP:  Bone density:  Lipid panel:  Allergies  Allergen Reactions   Statins Rash and Other (See Comments)    Hip pain, constant    Elastic Bandages & [Zinc] Rash    Paper tape should be okay   Iron Nausea And Vomiting   Latex Dermatitis   Nsaids     Told not to take d/t heart status   Oxycodone Anxiety    Current Outpatient Medications  Medication Sig Dispense Refill   acetaminophen (TYLENOL) 500 MG tablet Take 2 tablets (1,000 mg total) by mouth every 6 (six) hours as needed for mild pain, moderate pain or headache. 30 tablet 0   aspirin EC 325 MG EC tablet Take 1 tablet (325 mg total) by mouth 2 (two) times daily. (Patient taking differently: Take 325 mg by mouth daily. Once a day in the morning) 30 tablet 0   carvedilol (COREG) 3.125 MG tablet Take 3.125 mg by mouth 2 (two) times daily with a meal.     ibuprofen (ADVIL) 200 MG tablet Take 600 mg by mouth every 6 (six) hours as needed for headache or moderate pain.     letrozole (FEMARA) 2.5 MG tablet Take 1 tablet (2.5 mg total) by mouth daily. 90 tablet 3   lisinopril (PRINIVIL,ZESTRIL) 2.5 MG tablet Take 1  tablet (2.5 mg total) by mouth daily. PLEASE CONTACT OFFICE FOR ADDITIONAL REFILLS (Patient taking differently: Take 2.5 mg by mouth daily. In the morning) 30 tablet 0   magnesium oxide (MAG-OX) 400 MG tablet Take 400 mg by mouth daily.     nitroGLYCERIN (NITROSTAT) 0.4 MG SL tablet PLACE 1 TABLET UNDER TONGUE EVERY 5 MINUTES IF NEEDED FOR CHEST PAIN UP TO 3 DOSES (Patient taking differently: Place 0.4 mg under the tongue every 5 (five) minutes as needed for chest pain.) 25 tablet 5   Potassium 99 MG TABS Take 99 mg by mouth daily.     spironolactone (ALDACTONE) 25 MG tablet Take 0.5 tablets (12.5 mg total) by mouth daily. (Patient taking differently: Take 12.5 mg by mouth every evening.) 30 tablet 6   triamcinolone cream (KENALOG) 0.1 % Apply 1 application topically daily as needed (rash).   3   vitamin B-12 (CYANOCOBALAMIN) 1000 MCG tablet Take 1,000 mcg by mouth daily.     No current facility-administered medications for this visit.    OBJECTIVE: There  were no vitals filed for this visit.    There is no height or weight on file to calculate BMI.    ECOG FS:0 - Asymptomatic  General: Well-developed, well-nourished, no acute distress. HEENT: Normocephalic. Neuro: Alert, answering all questions appropriately. Cranial nerves grossly intact. Psych: Normal affect.   LAB RESULTS:  Lab Results  Component Value Date   NA 137 02/02/2019   K 3.8 02/02/2019   CL 108 02/02/2019   CO2 21 (L) 02/02/2019   GLUCOSE 96 02/02/2019   BUN 13 02/02/2019   CREATININE 0.70 02/02/2019   CALCIUM 8.8 (L) 02/02/2019   PROT 6.8 02/02/2019   ALBUMIN 3.7 02/02/2019   AST 20 02/02/2019   ALT 25 02/02/2019   ALKPHOS 88 02/02/2019   BILITOT 0.5 02/02/2019   GFRNONAA >60 02/02/2019   GFRAA >60 02/02/2019    Lab Results  Component Value Date   WBC 9.0 02/02/2019   NEUTROABS 5.2 02/02/2019   HGB 12.7 02/02/2019   HCT 37.8 02/02/2019   MCV 99.0 02/02/2019   PLT 240 02/02/2019     STUDIES: NM  Myocar Multi W/Spect W/Wall Motion / EF  Result Date: 02/22/2021  Blood pressure demonstrated a normal response to exercise.  There was no ST segment deviation noted during stress.  Defect 1: There is a large defect of severe severity present in the mid anterior, mid anterolateral and apical anterior location.  Findings consistent with prior myocardial infarction.  This is an intermediate risk study.  The left ventricular ejection fraction is severely decreased (<30%).      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  PLAN:    1. 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  Case discussed with surgery and cardiology.  Patient also had consultation with plastic surgery.  It was decided that it was too dangerous to undergo reconstruction and patient plans to pursue simple lumpectomy.  Continue neoadjuvant letrozole.  Have recommended chemotherapy likely using Taxotere plus carboplatinum or Taxotere plus Cytoxan.  Patient is HER2 positive and recommendations are to use Herceptin and Perjeta, but appears the risk may outweigh the benefit and will further discuss with patient in the next clinic visit.  Return to clinic 1 to 2 weeks after her lumpectomy for further evaluation and additional treatment planning.    2.  Cardiomyopathy: Patient's most recent cardiac echo on February 12, 2021 reported ejection fraction of 30%.  Appreciate cardiology input.  I provided 30 minutes of face-to-face video visit time during this encounter which included chart review, counseling, and coordination of care as documented above.    Patient expressed understanding and was in agreement with this plan. She also understands that She can call clinic at any time with any questions, concerns, or complaints.   Cancer Staging Carcinoma of breast, female, left  [facility]) Staging form: Breast, AJCC 8th Edition - Clinical stage from 01/24/2021: Stage IIB (cT3, cN1, cM0, G3, ER+, PR+, HER2+) - Signed by Lloyd Huger, MD on 02/08/2021 Histologic grading system: 3 grade system   Lloyd Huger, MD   03/05/2021 11:47 AM

## 2021-03-05 ENCOUNTER — Other Ambulatory Visit: Payer: Self-pay

## 2021-03-05 ENCOUNTER — Telehealth (INDEPENDENT_AMBULATORY_CARE_PROVIDER_SITE_OTHER): Payer: 59 | Admitting: Plastic Surgery

## 2021-03-05 ENCOUNTER — Encounter: Payer: Self-pay | Admitting: Oncology

## 2021-03-05 ENCOUNTER — Inpatient Hospital Stay: Payer: 59 | Attending: Oncology | Admitting: Oncology

## 2021-03-05 DIAGNOSIS — C50912 Malignant neoplasm of unspecified site of left female breast: Secondary | ICD-10-CM

## 2021-03-05 DIAGNOSIS — Z170 Estrogen receptor positive status [ER+]: Secondary | ICD-10-CM | POA: Insufficient documentation

## 2021-03-05 DIAGNOSIS — C50412 Malignant neoplasm of upper-outer quadrant of left female breast: Secondary | ICD-10-CM | POA: Insufficient documentation

## 2021-03-05 DIAGNOSIS — Z79811 Long term (current) use of aromatase inhibitors: Secondary | ICD-10-CM | POA: Insufficient documentation

## 2021-03-05 NOTE — Progress Notes (Signed)
Patient scheduled for virtual visit today to follow up based on plastic surgery recommendations. Patient states she cannot proceed with bilateral mastectomy, will proceed with lumpectomy.

## 2021-03-05 NOTE — Progress Notes (Signed)
The patient is a 60 year old female joining me by telemetry visit for further discussion about her breast reconstruction.  In May she was diagnosed with left invasive mammary carcinoma.  She is 5 feet 3 inches tall and weighs 210 pounds.  She was able to think about all of her options that we discussed at her last visit.  She discussed them with her husband and with Dr. Grayland Ormond.  She would like to move forward with lumpectomy only.  I think that this is the safest route for her.  Considering her medical conditions and after extensive discussion with Dr. Bary Castilla I think that this is the safest way to move forward.  I let her know that if she needs anything she is most welcome to give [place] a call after she is received her radiation and if anything changes I remain available.  I connected with  [name] on 03/05/21 by a video enabled telemedicine application and verified that I am speaking with the correct person using two identifiers.  The patient was at home and I was at the office.  10 minutes was spent in the following manner: Discussion, review of chart, documenting and informing Dr. Bary Castilla of the patient's decision.   I discussed the limitations of evaluation and management by telemedicine. The patient expressed understanding and agreed to proceed.

## 2021-03-07 ENCOUNTER — Other Ambulatory Visit: Payer: Self-pay | Admitting: General Surgery

## 2021-03-07 DIAGNOSIS — C50912 Malignant neoplasm of unspecified site of left female breast: Secondary | ICD-10-CM

## 2021-03-07 NOTE — Progress Notes (Addendum)
Subjective:     Patient ID: Erika Rios is a 60 y.o. female.   HPI   The following portions of the patient's history were reviewed and updated as appropriate.   This a new patient is here today for: office visit. She states she could feel a lump in the left breast prior to her mammogram. She does self breast checks  She states she did bruise from the left breast biopsy on 01-23-21. She had a benign right breast biopsy completed in 2018.   The patient had noted a mass in the left breast in the last few months, and was subsequently seen by the radiology service who biopsied 2 areas in the breast as well as a palpable lymph node.  All sites of turned out to have invasive mammary carcinoma.   She has been followed carefully by cardiology due to a gradually progressive congestive heart failure with her last cardiac echo showing an EF of 25%.  She is clinically asymptomatic with day-to-day activities.   In early 2020 she presented with abdominal pain and she was found to have severe cholecystitis with an intrahepatic abscess involving the right lobe.  She underwent percutaneous drainage, IV antibiotics and subsequently a surprisingly uneventful laparoscopic cholecystectomy.  She has remained asymptomatic in regards to her abdomen.   She has been working closely with "Dr. Essie Hart" since her diagnosis and came to the office with her plan of bilateral mastectomy with autologous reconstruction.   She is here with her husband of 32 years, [name] and her sister from [place], Aggie Hacker.  I had cared for her husband about 12 years ago when he had a melanoma, and that prompted her desire to come to this office.   She had been offered the opportunity to follow-up with Dr. Hampton Abbot who had completed her cholecystectomy in 2020.   Review of Systems  Constitutional: Negative for chills and fever.  Respiratory: Negative for cough.          Chief Complaint  Patient presents with   New Patient      BP (!)  146/82   Pulse 76   Temp 36.7 C (98 F)   Ht 160 cm (_0 )   Wt 94.8 kg (209 lb)   LMP  (LMP Unknown)   SpO2 98%   BMI 37.02 kg/m        Past Medical History:  Diagnosis Date   Cardiomyopathy, secondary (CMS-HCC)     COPD (chronic obstructive pulmonary disease) (CMS-HCC)     Coronary artery disease     GERD (gastroesophageal reflux disease)     Heart attack (CMS-HCC) 2017   History of tobacco abuse     Obesity             Past Surgical History:  Procedure Laterality Date   cardiac catherization   02/24/2016   CHOLECYSTECTOMY   02/02/2019    Dr Hampton Abbot   heart stint       implantable cardioverter   10/22/2018    defibrillator   right breast biopsy   2018                OB History     Gravida  2   Para  2   Term      Preterm      AB      Living         SAB      IAB      Ectopic  Molar      Multiple      Live Births           Obstetric Comments  Age at first period 64 Age of first pregnancy 22             Social History           Socioeconomic History   Marital status: Married  Tobacco Use   Smoking status: Former Smoker      Packs/day: 0.50      Years: 34.00      Pack years: 17.00      Types: Cigarettes      Quit date: 03/19/2016      Years since quitting: 4.8   Smokeless tobacco: Never Used   Tobacco comment: pt has not smoked in 3.5 weeks, since her heart attack  Vaping Use   Vaping Use: Never used  Substance and Sexual Activity   Alcohol use: Yes      Comment: occasionally   Drug use: Never   Sexual activity: Not Currently             Allergies  Allergen Reactions   Statins-Hmg-Coa Reductase Inhibitors Unknown      Hip pain, constant   Latex Dermatitis   Morphine Rash   Iron Nausea And Vomiting   Oxycodone Anxiety and Other (See Comments)   Zinc Rash      Current Medications        Current Outpatient Medications  Medication Sig Dispense Refill   acetaminophen (TYLENOL) 500 MG tablet Take 1,000 mg  by mouth as needed         albuterol 90 mcg/actuation inhaler Inhale 2 inhalations into the lungs every 6 (six) hours as needed for Wheezing 18 g 5   aspirin 325 MG EC tablet Take 325 mg by mouth once daily     0   carvediloL (COREG) 3.125 MG tablet Take 1 tablet (3.125 mg total) by mouth 2 (two) times daily with meals 180 tablet 3   cyanocobalamin, vitamin B-12, 5,000 mcg Cap Take 5,000 mcg by mouth once daily       FUROsemide (LASIX) 20 MG tablet Take 1 tablet (20 mg total) by mouth once daily 30 tablet 11   lisinopriL (ZESTRIL) 5 MG tablet Take 1 tablet (5 mg total) by mouth once daily 90 tablet 3   nitroGLYcerin (NITROSTAT) 0.4 MG SL tablet PLACE 1 TABLET UNDER TONGUE EVERY 5 MINUTES IF NEEDED FOR CHEST PAIN UP TO 3 DOSES   12   pantoprazole (PROTONIX) 40 MG DR tablet Take 1 tablet (40 mg total) by mouth once daily (Patient taking differently: Take 40 mg by mouth as needed) 90 tablet 3   potassium gluconate 2.5 mEq Tab Take 198 mg by mouth once daily       rosuvastatin (CRESTOR) 5 MG tablet Take 1 tab twice a week 30 tablet 3   spironolactone (ALDACTONE) 25 MG tablet Take 0.5 tablets (12.5 mg total) by mouth once daily 90 tablet 1   triamcinolone 0.1 % cream APPLY TOPICALLY 2 (TWO) TIMES DAILY APPLY TO RASH TWICE DAILY. (Patient taking differently: Apply topically as needed Apply to rash twice daily.) 30 g 3   blood glucose diagnostic, drum test strip Use 3 (three) times daily before meals for 180 days Use as instructed. (Patient not taking: Reported on 01/29/2021) 300 each 1   blood glucose meter kit Use 3 (three) times daily before meals (Patient not taking: Reported on 01/29/2021) 1  each 0   lancets Use 1 each 3 (three) times daily before meals Use as instructed. (Patient not taking: Reported on 01/29/2021) 300 each 1   metFORMIN (GLUCOPHAGE) 500 MG tablet Take 1 tablet (500 mg total) by mouth daily with breakfast for 180 days (Patient not taking: Reported on 01/29/2021) 90 tablet 1   ONETOUCH  ULTRA BLUE TEST STRIP test strip USE 3 (THREE) TIMES DAILY BEFORE MEALS (Patient not taking: Reported on 01/29/2021) 300 strip 1    No current facility-administered medications for this visit.             Family History  Problem Relation Age of Onset   Cancer Mother     Heart disease Mother     Breast cancer Mother 75   Myocardial Infarction (Heart attack) Mother     Diabetes Father     Colon cancer Neg Hx               Objective:   Physical Exam Exam conducted with a chaperone present.  Constitutional:      Appearance: Normal appearance.  Cardiovascular:     Rate and Rhythm: Normal rate and regular rhythm.     Pulses: Normal pulses.     Heart sounds: Normal heart sounds.  Pulmonary:     Effort: Pulmonary effort is normal.     Breath sounds: Normal breath sounds.  Chest:  Breasts:     Right: Normal. No axillary adenopathy or supraclavicular adenopathy.     Left: Mass and axillary adenopathy present. No supraclavicular adenopathy.         Comments: Palpable fullness in the left axilla.  Bruising and tenderness in the lateral upper aspect of the left breast post biopsy x2. Musculoskeletal:     Cervical back: Neck supple.  Lymphadenopathy:     Upper Body:     Right upper body: No supraclavicular or axillary adenopathy.     Left upper body: Axillary adenopathy present. No supraclavicular adenopathy.  Skin:    General: Skin is warm and dry.  Neurological:     Mental Status: She is alert and oriented to person, place, and time.  Psychiatric:        Mood and Affect: Mood normal.        Behavior: Behavior normal.      Labs and Radiology:    Jan 23, 2021 left breast and axillary biopsy results:   DIAGNOSIS:  A. LEFT BREAST, 2:30 11 CMFN; ULTRASOUND-GUIDED BIOPSY:  - INVASIVE MAMMARY CARCINOMA, NO SPECIAL TYPE.   Size of invasive carcinoma: 13 mm in this sample  Histologic grade of invasive carcinoma: Grade 3                       Glandular/tubular  differentiation score: 2                       Nuclear pleomorphism score: 3                       Mitotic rate score: 3                       Total score: 8  Ductal carcinoma in situ: Present  Lymphovascular invasion: Not identified   Comment:  The definitive grade will be assigned on the excisional specimen.  ER/PR/HER2: Immunohistochemistry will be performed on block A1, with  reflex to [place] for HER2 2+. The results will  be reported in an addendum.    B. LEFT BREAST 4:00 RETROAREOLAR; ULTRASOUND-GUIDED BIOPSY:  - INVASIVE MAMMARY CARCINOMA WITH FOCAL MICROPAPILLARY FEATURES.   Size of invasive carcinoma: 4 mm in this sample  Histologic grade of invasive carcinoma: Grade 3                       Glandular/tubular differentiation score: 3                       Nuclear pleomorphism score: 3                       Mitotic rate score: 2                       Total score: 8  Ductal carcinoma in situ: Not identified  Lymphovascular invasion: Not identified   Comment:  The definitive grade will be assigned on the excisional specimen.  ER/PR/HER2 Immunohistochemistry will be performed on block B1, with  reflex to [place] for HER2 2+. The results will be reported in an addendum.    C. LYMPH NODE, LEFT AXILLA; ULTRASOUND-GUIDED BIOPSY:  -  METASTATIC MAMMARY CARCINOMA, 10MM IN GREATEST LINEAR EXTENT   Imaging studies of the breast from November 13, 2016 through November 23, 2020 was undertaken.  CT of the abdomen at the time of her presentation with intra-abdominal abscess was reviewed as well.   August 08, 2020 laboratory:   Hemoglobin A1C 4.2 - 5.6 % 6.4 High      Glucose 70 - 110 mg/dL 112 High    Sodium 136 - 145 mmol/L 140   Potassium 3.6 - 5.1 mmol/L 4.3   Chloride 97 - 109 mmol/L 107   Carbon Dioxide (CO2) 22.0 - 32.0 mmol/L 24.5   Urea Nitrogen (BUN) 7 - 25 mg/dL 14   Creatinine 0.6 - 1.1 mg/dL 0.7   Glomerular Filtration Rate (eGFR), MDRD Estimate >60 mL/min/1.73sq m 86    Calcium 8.7 - 10.3 mg/dL 9.4   AST  8 - 39 U/L 22   ALT  5 - 38 U/L 27   Alk Phos (alkaline Phosphatase) 34 - 104 U/L 81   Albumin 3.5 - 4.8 g/dL 4.1   Bilirubin, Total 0.3 - 1.2 mg/dL 0.4   Protein, Total 6.1 - 7.9 g/dL 6.5   A/G Ratio 1.0 - 5.0 gm/dL 1.7     WBC (White Blood Cell Count) 4.1 - 10.2 10^3/uL 7.8   RBC (Red Blood Cell Count) 4.04 - 5.48 10^6/uL 4.06   Hemoglobin 12.0 - 15.0 gm/dL 13.6   Hematocrit 35.0 - 47.0 % 41.1   MCV (Mean Corpuscular Volume) 80.0 - 100.0 fl 101.2 High    MCH (Mean Corpuscular Hemoglobin) 27.0 - 31.2 pg 33.5 High    MCHC (Mean Corpuscular Hemoglobin Concentration) 32.0 - 36.0 gm/dL 33.1   Platelet Count 150 - 450 10^3/uL 264   RDW-CV (Red Cell Distribution Width) 11.6 - 14.8 % 12.8   MPV (Mean Platelet Volume) 9.4 - 12.4 fl 9.2 Low    Neutrophils 1.50 - 7.80 10^3/uL 4.05   Lymphocytes 1.00 - 3.60 10^3/uL 2.57   Monocytes 0.00 - 1.50 10^3/uL 0.81   Eosinophils 0.00 - 0.55 10^3/uL 0.29   Basophils 0.00 - 0.09 10^3/uL 0.08   Neutrophil % 32.0 - 70.0 % 51.7   Lymphocyte % 10.0 - 50.0 % 32.9   Monocyte % 4.0 - 13.0 % 10.4  Eosinophil % 1.0 - 5.0 % 3.7   Basophil% 0.0 - 2.0 % 1.0   Immature Granulocyte % <=0.7 % 0.3   Immature Granulocyte Count <=0.06 10^3/L 0.02     MCV > 100 on 3/4 determinations since May 2020.   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   Significant impairment of  cardiac function.   Previous ICD implantation.   Obesity.   Modestly elevated hemoglobin A1c.    Plan:     There are many pieces of the puzzle that are not available at today's visit.  Receptor status is not available at this time.   The patient thought that bilateral mastectomy would be appropriate as she anticipated a 50% risk of contralateral disease.  We reviewed the fact that this is more likely to be 4% over the next 10 years.   She has a very generous breast volume at 66 G and certainly has the potential for breast conservation.  She is very concerned about the potential effects of radiation on her heart function, and realistically were probably talking less than 3% scatter to the left ventricle with new CT guided targeting.   She felt she was not a candidate for chemotherapy based on her low cardiac function, reading on the Internet again that chemotherapy can affect cardiac function (a friend or family member had chemotherapy for breast cancer and subsequently developed congestive heart failure sometime later.     We reviewed that some patients still benefit from adjuvant chemotherapy, judiciously administered in light of her cardiac reserve being very modest.  I explained to her that she O-Due to herself to at least sit down as scheduled later this week with medical oncology for their input.   With the finding of nodal disease she certainly be a candidate for preoperative PET/CT as well as Oncotype DX testing to determine if she would benefit from adjuvant chemotherapy.  Not knowing her receptor status makes it difficult to tell whether neoadjuvant would be appropriate.   Regarding the plan for bilateral mastectomy and autologous tissue transfer, I am not sure her body habitus would put her in the best situation for this and it would significantly prolong her time in the operating theater which would have a negative impact on cardiac function.  I do think it would be beneficial for  her to meet with plastic surgery both to hear their input regarding reconstruction options (the patient flatly declines mastectomy as the asymmetry would be too much) and if she is not a candidate for autologous reconstruction she might benefit from level 2 oncoplastic reconstruction followed by judiciously administered radiation therapy.   The patient reports that her mother had breast cancer in her late 39s.  We discussed present recommendations are that all women undergo genetic testing although I think it is unlikely that she would be positive.  If she were BRCA 1/2 positive bilateral mastectomy might well be appropriate.  With her cardiac dysfunction, I think we need to be looking at the 10-year life expectancy rather than 20 as would normally be considered for a woman of her  young age.   The importance of keeping her cardiologist in the loop was reviewed and certainly his input regarding anesthesia will be appreciated.    Over 1 hour was spent reviewing the patient's history, imaging studies and options for management.  Majority of this was regarding possible treatment options recognizing that not knowing her receptor status or evidence of metastatic disease would greatly influence recommendations.   Genetic testing, PET CT to be discussed with medical oncology later this week.   A referral to [place], DO from plastics has been placed. This note is partially prepared by Karie Fetch, RN, acting as a scribe in the presence of Dr. Hervey Ard, MD.  The documentation recorded by the scribe accurately reflects the service I personally performed and the decisions made by me.    Robert Bellow, MD FACS  After consultation with medical oncology, plastic surgery and  cardiology, present plan is for resection of the know tumors with oncoplastic technique and axillary dissection to minimize time in the OR and cardiac risk.  March 07, 2021.   March 12, 2021:  Subjective:     Patient  ID: Erika Rios is a 60 y.o. female.   HPI   The following portions of the patient's history were reviewed and updated as appropriate.   This an established patient is here today for: office visit. The patient is here today for a pre-operative visit. She is scheduled for a left lumpectomy on 03-15-21 at [facility].    The patient has been evaluated by plastic surgery and has met with medical oncology.  She was not felt to be a candidate for bilateral mastectomy as was her initial desire based on her cardiac status.  With her large breast volume, it was felt appropriate to consider wide excision, axillary dissection with her known metastatic disease and radiation therapy to follow.  Contralateral reduction mammoplasty can be undertaken based on her tolerance of treatment of her known cancer side.            Chief Complaint  Patient presents with   Pre-op Exam      BP 122/78   Pulse 75   Temp 36.7 C (98 F)   Ht 160 cm (_0 )   Wt 94.3 kg (208 lb)   LMP  (LMP Unknown)   SpO2 97%   BMI 36.85 kg/m        Past Medical History:  Diagnosis Date   Breast cancer metastasized to axillary lymph node, left (CMS-HCC) 01/23/2021    Left upper outer quadrant and retroareolar area, positive axillary node on biopsy.   Cardiomyopathy, secondary (CMS-HCC)     COPD (chronic obstructive pulmonary disease) (CMS-HCC)     Coronary artery disease     GERD (gastroesophageal reflux disease)     Heart attack (CMS-HCC) 2017   History of tobacco abuse     Obesity             Past Surgical History:  Procedure Laterality Date   cardiac catherization   02/24/2016   CHOLECYSTECTOMY   02/02/2019    Dr Hampton Abbot   heart stint       implantable cardioverter   10/22/2018    defibrillator   right breast biopsy   2018                OB History     Gravida  2   Para  2   Term      Preterm  AB      Living         SAB      IAB      Ectopic      Molar      Multiple      Live  Births           Obstetric Comments  Age at first period 72 Age of first pregnancy 28             Social History           Socioeconomic History   Marital status: Married  Tobacco Use   Smoking status: Former Smoker      Packs/day: 0.50      Years: 34.00      Pack years: 17.00      Types: Cigarettes      Quit date: 03/19/2016      Years since quitting: 4.9   Smokeless tobacco: Never Used   Tobacco comment: pt has not smoked in 3.5 weeks, since her heart attack  Vaping Use   Vaping Use: Never used  Substance and Sexual Activity   Alcohol use: Yes      Comment: occasionally   Drug use: Never   Sexual activity: Not Currently             Allergies  Allergen Reactions   Statins-Hmg-Coa Reductase Inhibitors Unknown      Hip pain, constant   Latex Dermatitis   Morphine Rash   Iron Nausea And Vomiting   Oxycodone Anxiety and Other (See Comments)   Zinc Rash      Current Medications        Current Outpatient Medications  Medication Sig Dispense Refill   acetaminophen (TYLENOL) 500 MG tablet Take 1,000 mg by mouth as needed         albuterol 90 mcg/actuation inhaler Inhale 2 inhalations into the lungs every 6 (six) hours as needed for Wheezing 18 g 5   aspirin 325 MG EC tablet Take 325 mg by mouth once daily   0   carvediloL (COREG) 3.125 MG tablet TAKE 1 TABLET BY MOUTH TWICE A DAY WITH MEALS 60 tablet 11   cyanocobalamin, vitamin B-12, 5,000 mcg Cap Take 5,000 mcg by mouth once daily       letrozole (FEMARA) 2.5 mg tablet Take 2.5 mg by mouth once daily       lisinopriL (ZESTRIL) 5 MG tablet Take 1 tablet (5 mg total) by mouth once daily 90 tablet 3   nitroGLYcerin (NITROSTAT) 0.4 MG SL tablet PLACE 1 TABLET UNDER TONGUE EVERY 5 MINUTES IF NEEDED FOR CHEST PAIN UP TO 3 DOSES   12   ONETOUCH ULTRA BLUE TEST STRIP test strip USE 3 (THREE) TIMES DAILY BEFORE MEALS 300 strip 1   pantoprazole (PROTONIX) 40 MG DR tablet Take 1 tablet (40 mg total) by mouth once daily  (Patient taking differently: Take 40 mg by mouth as needed) 90 tablet 3   potassium gluconate 2.5 mEq Tab Take 198 mg by mouth once daily       rosuvastatin (CRESTOR) 5 MG tablet Take 1 tab twice a week 30 tablet 3   spironolactone (ALDACTONE) 25 MG tablet TAKE 1/2 TABLET BY MOUTH ONCE DAILY 15 tablet 11   triamcinolone 0.1 % cream APPLY TOPICALLY 2 (TWO) TIMES DAILY APPLY TO RASH TWICE DAILY. (Patient taking differently: Apply topically as needed Apply to rash twice daily.) 30 g 3   blood glucose diagnostic, drum test strip  Use 3 (three) times daily before meals for 180 days Use as instructed. (Patient not taking: Reported on 01/29/2021) 300 each 1   blood glucose meter kit Use 3 (three) times daily before meals (Patient not taking: Reported on 01/29/2021) 1 each 0   FUROsemide (LASIX) 20 MG tablet Take 1 tablet (20 mg total) by mouth once daily 30 tablet 11   lancets Use 1 each 3 (three) times daily before meals Use as instructed. (Patient not taking: Reported on 01/29/2021) 300 each 1    No current facility-administered medications for this visit.             Family History  Problem Relation Age of Onset   Cancer Mother     Heart disease Mother     Breast cancer Mother 41   Myocardial Infarction (Heart attack) Mother     Diabetes Father     Colon cancer Neg Hx            Review of Systems  Constitutional: Negative for chills and fever.  Respiratory: Negative for cough.          Objective:   Physical Exam Exam conducted with a chaperone present.  Constitutional:      Appearance: Normal appearance.  Cardiovascular:     Rate and Rhythm: Normal rate and regular rhythm.     Pulses: Normal pulses.     Heart sounds: Normal heart sounds.  Pulmonary:     Effort: Pulmonary effort is normal.     Breath sounds: Normal breath sounds.  Chest:  Breasts:     Left: No supraclavicular adenopathy.         Comments: Left upper outer quadrant with planned area of resection, preservation  of the nipple-areolar complex.  Ultrasound identified "clipped" node lower axilla. Musculoskeletal:     Cervical back: Neck supple.  Lymphadenopathy:     Upper Body:     Left upper body: No supraclavicular adenopathy.  Skin:    General: Skin is warm and dry.  Neurological:     Mental Status: She is alert and oriented to person, place, and time.  Psychiatric:        Mood and Affect: Mood normal.        Behavior: Behavior normal.      Labs and Radiology:    Review and conversation with Audelia Hives, DO from plastics.   Ultrasound:   Ultrasound examination of the axilla was completed to confirm the previously biopsied lymph node is identified and will be included in the surgical field.  This was identified at the base of the axilla, marked with a surgical marker and the site covered with a Tegaderm to minimize disruption.      Assessment:     Multifocal invasive mammary carcinoma of the left breast with axillary metastatic disease.    Plan:     2 foci of invasive mammary carcinoma with microcalcifications from each lesion extending within 2.5 cm of each other.  Based on review of her mammograms, the upper outer quadrant of the left breast contains the majority of the residual breast parenchyma and I think she will do well with resection of this en bloc followed by axillary dissection.  The mammographic images were reviewed with the patient to explain the planned surgical procedure.    This note is partially prepared by Ledell Noss, CMA acting as a scribe in the presence of Dr. Hervey Ard, MD.    The documentation recorded by the scribe accurately reflects the  service I personally performed and the decisions made by me.    Robert Bellow, MD FACS

## 2021-03-07 NOTE — Addendum Note (Signed)
Addended byHervey Ard on: 03/07/2021 08:11 AM   Modules accepted: Orders

## 2021-03-11 ENCOUNTER — Encounter
Admission: RE | Admit: 2021-03-11 | Discharge: 2021-03-11 | Disposition: A | Discharge status: Home / Self Care | Payer: 59 | Source: Ambulatory Visit | Attending: General Surgery | Admitting: General Surgery

## 2021-03-11 ENCOUNTER — Encounter: Payer: Self-pay | Admitting: General Surgery

## 2021-03-11 ENCOUNTER — Other Ambulatory Visit: Payer: Self-pay

## 2021-03-11 ENCOUNTER — Telehealth: Payer: Self-pay

## 2021-03-11 NOTE — Patient Instructions (Signed)
Your procedure is scheduled on: 03/15/21 Report to [place] [phone]  Remember: Instructions that are not followed completely may result in serious medical risk, up to and including death, or upon the discretion of your surgeon and anesthesiologist your surgery may need to be rescheduled.     _X__ 1. Do not eat food OR DRINK LIQUIDS after midnight the night before your procedure.                 No gum chewing or hard candies.   __X__2.  On the morning of surgery brush your teeth with toothpaste and water, you                 may rinse your mouth with mouthwash if you wish.  Do not swallow any              toothpaste of mouthwash.     _X__ 3.  No Alcohol for 24 hours before or after surgery.   _X__ 4.  Do Not Smoke or use e-cigarettes For 24 Hours Prior to Your Surgery.                 Do not use any chewable tobacco products for at least 6 hours prior to                 surgery.  ____  5.  Bring all medications with you on the day of surgery if instructed.   __X__  6.  Notify your doctor if there is any change in your medical condition      (cold, fever, infections).     Do not wear jewelry, make-up, hairpins, clips or nail polish. Do not wear lotions, powders, or perfumes.  Do not shave 48 hours prior to surgery. Men may shave face and neck. Do not bring valuables to the hospital.    [facility] is not responsible for any belongings or valuables.  Contacts, dentures/partials or body piercings may not be worn into surgery. Bring a case for your contacts, glasses or hearing aids, a denture cup will be supplied. Leave your suitcase in the car. After surgery it may be brought to your room. For patients admitted to the hospital, discharge time is determined by your treatment team.   Patients discharged the day of surgery will not be allowed to drive home.   Please read over the following fact sheets that you were given:     __X__ Take  these medicines the morning of surgery with A SIP OF WATER:    1. carvedilol (COREG) 3.125 MG tablet  2. letrozole (FEMARA) 2.5 MG tablet  3.   4.  5.  6.  ____ Fleet Enema (as directed)   __X__ Use CHG Soap/SAGE wipes as directed  ____ Use inhalers on the day of surgery  ____ Stop metformin/Janumet/Farxiga 2 days prior to surgery    ____ Take 1/2 of usual insulin dose the night before surgery. No insulin the morning          of surgery.   ____ Stop Blood Thinners Coumadin/Plavix/Xarelto/Pleta/Pradaxa/Eliquis/Effient/Aspirin  on   Or contact your Surgeon, Cardiologist or Medical Doctor regarding  ability to stop your blood thinners  __X__ Stop Anti-inflammatories 7 days before surgery such as Advil, Ibuprofen, Motrin,  BC or Goodies Powder, Naprosyn, Naproxen, Aleve   __X__ Stop all herbal supplements, fish oil or vitamin E until after surgery.    ____ Bring C-Pap to the hospital.  Contact Dr Bary Castilla for instructions regarding Aspirin

## 2021-03-11 NOTE — Progress Notes (Signed)
Perioperative Services  Pre-Admission/Anesthesia Testing Clinical Review  Date: 03/11/21  Patient Demographics:  Name: Erika Rios DOB:   November 18, 1960 MRN:   [id number]  Planned Surgical Procedure(s):    Case: [id number] Date/Time: 03/15/21 1315   Procedure: BREAST LUMPECTOMY WITH NEEDLE LOCALIZATION (Left)   Anesthesia type: General   Pre-op diagnosis: left breast cancer   Location: [facility] OR ROOM 02 / [place] ORS FOR ANESTHESIA GROUP   Surgeons: Robert Bellow, MD     NOTE: Available PAT nursing documentation and vital signs have been reviewed. Clinical nursing staff has updated patient's PMH/PSHx, current medication list, and drug allergies/intolerances to ensure comprehensive history available to assist in medical decision making as it pertains to the aforementioned surgical procedure and anticipated anesthetic course. Extensive review of available clinical information performed.  PMH and PSHx updated with any diagnoses/procedures that  may have been inadvertently omitted during her intake with the pre-admission testing department's nursing staff.  Clinical Discussion:  Erika Rios is a 60 y.o. female who is submitted for pre-surgical anesthesia review and clearance prior to her undergoing the above procedure. Patient is a Former Smoker (17 pack years; quit 03/2016). Pertinent PMH includes: CAD, STEMI, ischemic cardiomyopathy, HTN, HLD, T2DM, DOE, COPD, GERD (no daily Tx), triple positive invasive mammary carcinoma.  Patient is followed by cardiology Saralyn Pilar, MD). She was last seen in the cardiology clinic on 02/20/2021; notes reviewed.  At the time of her clinic visit, patient reporting increased exertional dyspnea associated with climbing stairs/hills.  Overall respiratory status felt to be worse since having SARS-CoV-2 and 04/2020.  She denied any episodes of chest pain.  No PND, orthopnea, palpitations, significant peripheral edema, vertiginous symptoms, or  presyncope/syncope.  Patient with a PMH significant for cardiovascular diagnoses.  Patient suffered an anterior wall STEMI on 02/24/2016.  Diagnostic left heart catheterization performed revealing significant single-vessel CAD with a 99% stenosis of the proximal LAD-2.  There were additional 40% lesions and the proximal LAD-1 and 20% stenosis of the 2nd marginal.  There was moderate to severe left ventricular systolic dysfunction; LVEF 25-35%.  DES x 1 placed to the proximal LAD-2 lesion.  TTE performed on 02/26/2016 revealed a moderately reduced left ventricular systolic function with EF of 35-40%.  There were regional wall motion abnormalities, mild left atrial enlargement, mild LVH, trivial mitral valve regurgitation, and a dilated IVC consistent with elevated CVP.  Repeat TTE performed on 04/02/2016 remain grossly unchanged.  Patient underwent AICD placement on 10/22/2018.  TTE performed on 02/12/2021 revealed moderate left ventricular systolic dysfunction with an EF of 30%.  There were continued regional wall motion abnormalities.  Patient with mild LVH.  There was trivial mitral and tricuspid valve regurgitation.  Myocardial perfusion imaging study performed on 02/22/2021 demonstrated a large severe perfusion abnormality consistent with patient's prior myocardial infarction.  LV EF severely decreased at less than 30% (see full interpretation of cardiovascular testing and interventions below).  Patient on GDMT for her HTN and HLD diagnoses.  Blood pressure well controlled at 122/84 on currently prescribed beta-blocker, diuretic, and ACEi therapies.  Patient is on a statin for her HLD. T2DM well controlled on currently prescribed regimen; Hgb A1c 6.6% when last checked on 02/06/2021. Functional capacity, as defined by DASI, is documented as being >/= 4 METS.  No changes were made to patient's medication regimen.  Patient to follow-up with outpatient cardiology in 4 months or sooner if  needed.  Patient recently diagnosed with ER/PR (+) HER2/neu (+) invasive mammary carcinoma  of the LEFT breast.  She is under the care of Dr. Delight Hoh, MD with the [facility]; last seen in 03/05/2021.  Patient currently on neoadjuvant endocrine therapy using daily letrozole.  Plans are for adjuvant carboplatin + docetaxel or trastuzumab + pertuzumab, however patient's underlying cardiomyopathy places her at increased risk of worsening cardiac issues.  She is scheduled for an lumpectomy on 03/15/2021 with Dr. Hervey Ard, MD.  Given patient's past medical history significant for cardiovascular diagnoses, presurgical cardiac clearance was sought by the PAT team. Per cardiology, "this patient is optimized for surgery and may proceed with the planned procedural course with an ACCEPTABLE risk stratification". This patient is on daily antiplatelet therapy. She has been instructed on recommendations for holding her full dose ASA for 7 days prior to her procedure with plans to restart as soon as postoperative bleeding risk felt to be minimized by her attending surgeon. The patient has been instructed that her last dose of her anticoagulant will be on 03/07/2021.  Patient denies previous perioperative complications with anesthesia in the past.  Patient wanted to make mention of the fact that her son has a history of delayed emergence. In review of the available records, it is noted that patient underwent a general anesthetic course here (ASA IV) in 01/2019 without documented complications.   Vitals with BMI 03/11/2021 03/01/2021 01/31/2021  Height '5\' 3"'  '5\' 3"'  '5\' 3"'   Weight 208 lbs 210 lbs 6 oz 207 lbs 10 oz  BMI 36.85 06.26 94.85  Systolic - 462 703  Diastolic - 71 71  Pulse - 60 74    Providers/Specialists:   NOTE: Primary physician provider listed below. Patient may have been seen by APP or partner within same practice.   PROVIDER ROLE / SPECIALTY LAST OV  Bary Castilla  Forest Gleason, MD  General Surgery  03/07/2021  Tracie Harrier, MD  Primary Care Provider  02/13/2021  Isaias Cowman, MD  Cardiology  02/20/2021  Delight Hoh, MD  Medical Oncology  03/05/2021   Allergies:  Statins, Elastic bandages & [zinc], Entresto [sacubitril-valsartan], Iron, Latex, Nsaids, and Oxycodone  Current Home Medications:   No current facility-administered medications for this encounter.    acetaminophen (TYLENOL) 500 MG tablet   albuterol (VENTOLIN HFA) 108 (90 Base) MCG/ACT inhaler   aspirin EC 325 MG EC tablet   carvedilol (COREG) 3.125 MG tablet   ibuprofen (ADVIL) 200 MG tablet   letrozole (FEMARA) 2.5 MG tablet   lisinopril (ZESTRIL) 5 MG tablet   magnesium oxide (MAG-OX) 400 MG tablet   nitroGLYCERIN (NITROSTAT) 0.4 MG SL tablet   Potassium 99 MG TABS   rosuvastatin (CRESTOR) 5 MG tablet   spironolactone (ALDACTONE) 25 MG tablet   triamcinolone cream (KENALOG) 0.1 %   vitamin B-12 (CYANOCOBALAMIN) 250 MCG tablet   lisinopril (PRINIVIL,ZESTRIL) 2.5 MG tablet   History:   Past Medical History:  Diagnosis Date   AICD (automatic cardioverter/defibrillator) present 10/22/2018   CAD (coronary artery disease)    CHF (congestive heart failure) (HCC)    COPD (chronic obstructive pulmonary disease) (HCC)    DOE (dyspnea on exertion)    Family history of adverse reaction to anesthesia    (+) 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   GERD (gastroesophageal reflux disease)    History of 2019 novel coronavirus disease (COVID-19) 04/23/2020   HLD (hyperlipidemia)    HTN (hypertension)    Invasive carcinoma of breast ([place]) 01/23/2021   LEFT invasive mammary carcinoma; ER/PR (+), HER2/neu (+)  Ischemic cardiomyopathy    s/p AICD placement   ST elevation myocardial infarction (STEMI) of anterior wall, initial episode of care ([place]) 02/24/2016   DES x 1 to pLAD   Statin intolerance    T2DM (type 2 diabetes mellitus) ([place])    Tobacco abuse    quit smoking  2017   Past Surgical History:  Procedure Laterality Date   BREAST BIOPSY Right 12/10/2016   neg bx FIBROADENOMATOUS CHANGES WITH STROMAL AND LUMINAL CALCIFICATIONS. NEGATIVE   CARDIAC CATHETERIZATION N/A 02/24/2016   Procedure: Left Heart Cath and Coronary Angiography;  Surgeon: Troy Sine, MD;  Location: [place] [facility];  Service: Cardiovascular;  Laterality: N/A;   CARDIAC CATHETERIZATION N/A 02/24/2016   Procedure: Coronary Stent Intervention (3.2518 mm Xience Alpine DES stent);  Surgeon: Troy Sine, MD;  Location: [place] [facility];  Service: Cardiovascular   CHOLECYSTECTOMY N/A 02/02/2019   Procedure: LAPAROSCOPIC CHOLECYSTECTOMY - LATEX ALLERGY;  Surgeon: Olean Ree, MD;  Location: [facility] ORS;  Service: General;  Laterality: N/A;   IMPLANTABLE CARDIOVERTER DEFIBRILLATOR (ICD) GENERATOR CHANGE Left 10/22/2018   Procedure: ICD GENERATOR INSERTION;  Surgeon: Marzetta Board, MD;  Location: [facility] ORS;  Service: Cardiovascular;  Laterality: Left;   IR BILIARY DRAIN PLACEMENT WITH CHOLANGIOGRAM     Family History  Problem Relation Age of Onset   Cancer Mother    Heart disease Mother    Heart attack Mother    Breast cancer Mother    Diabetes Father    Social History   Tobacco Use   Smoking status: Former    Packs/day: 0.50    Years: 34.00    Pack years: 17.00    Types: Cigarettes    Quit date: 03/19/2016    Years since quitting: 4.9   Smokeless tobacco: Never  Vaping Use   Vaping Use: Never used  Substance Use Topics   Alcohol use: Yes    Alcohol/week: 0.0 standard drinks    Comment: occ   Drug use: No    Pertinent Clinical Results:  LABS: Labs reviewed: Acceptable for surgery.   Ref Range & Units 1 mo ago  WBC (White Blood Cell Count) 4.1 - 10.2 10^3/uL 7.7   RBC (Red Blood Cell Count) 4.04 - 5.48 10^6/uL 4.25   Hemoglobin 12.0 - 15.0 gm/dL 14.5   Hematocrit 35.0 - 47.0 % 42.0   MCV (Mean Corpuscular Volume) 80.0 - 100.0 fl 98.8   MCH (Mean Corpuscular  Hemoglobin) 27.0 - 31.2 pg 34.1 High    MCHC (Mean Corpuscular Hemoglobin Concentration) 32.0 - 36.0 gm/dL 34.5   Platelet Count 150 - 450 10^3/uL 262   RDW-CV (Red Cell Distribution Width) 11.6 - 14.8 % 12.4   MPV (Mean Platelet Volume) 9.4 - 12.4 fl 9.3 Low    Neutrophils 1.50 - 7.80 10^3/uL 3.94   Lymphocytes 1.00 - 3.60 10^3/uL 2.49   Monocytes 0.00 - 1.50 10^3/uL 0.95   Eosinophils 0.00 - 0.55 10^3/uL 0.20   Basophils 0.00 - 0.09 10^3/uL 0.08   Neutrophil % 32.0 - 70.0 % 51.2   Lymphocyte % 10.0 - 50.0 % 32.4   Monocyte % 4.0 - 13.0 % 12.4   Eosinophil % 1.0 - 5.0 % 2.6   Basophil% 0.0 - 2.0 % 1.0   Immature Granulocyte % <=0.7 % 0.4   Immature Granulocyte Count <=0.06 10^3/L 0.03   Resulting Agency  [facility] - LAB  Specimen Collected: 02/06/21 09:08 Last Resulted: 02/06/21 10:14  Received From: [facility]  Result Received: 02/12/21 16:17    Ref Range & Units 1 mo ago  Glucose 70 - 110 mg/dL 121 High    Sodium 136 - 145 mmol/L 138   Potassium 3.6 - 5.1 mmol/L 4.6   Chloride 97 - 109 mmol/L 105   Carbon Dioxide (CO2) 22.0 - 32.0 mmol/L 26.8   Urea Nitrogen (BUN) 7 - 25 mg/dL 15   Creatinine 0.6 - 1.1 mg/dL 0.9   Glomerular Filtration Rate (eGFR), MDRD Estimate >60 mL/min/1.73sq m 64   Calcium 8.7 - 10.3 mg/dL 9.4   AST  8 - 39 U/L 22   ALT  5 - 38 U/L 22   Alk Phos (alkaline Phosphatase) 34 - 104 U/L 83   Albumin 3.5 - 4.8 g/dL 4.2   Bilirubin, Total 0.3 - 1.2 mg/dL 0.4   Protein, Total 6.1 - 7.9 g/dL 6.9   A/G Ratio 1.0 - 5.0 gm/dL 1.6   Resulting Agency  [place] - LAB  Specimen Collected: 02/06/21 09:08 Last Resulted: 02/06/21 11:11  Received From: [facility]  Result Received: 02/12/21 16:17    Ref Range & Units 1 mo ago  Hemoglobin A1C 4.2 - 5.6 % 6.6 High    Average Blood Glucose (Calc) mg/dL 143   Resulting Agency  [place] - LAB    ECG: Date: 10/28/2018 Time ECG obtained: 1620  PM Rate: 108 bpm Rhythm: sinus tachycardia Intervals: PR 142 ms. QRS 66 ms. QTc 439 ms. ST segment and T wave changes: ST elevation noted in leads aVL and I; evidence of septal and lateral infarct present Comparison: Acute changes noted from tracing obtained on 10/18/2018    IMAGING / PROCEDURES: LEXISCAN performed on 02/22/2021 Blood pressure demonstrated normal response to exercise There was no ST segment deviation noted during stress Large defect of severe severity present in the mid anterior, mid anterolateral, and apical anterior location consistent with prior MI LVEF severely decreased at <30% This is an intermediate risk study  TRANSTHORACIC ECHOCARDIOGRAM performed on 02/12/2021 Moderate left ventricular systolic dysfunction; LVEF 30% Normal right ventricular systolic function Trivial MR and TR No AR or PR No valvular stenosis Mild LVH No evidence of pericardial effusion  LEFT HEART CATHETERIZATION AND CORONARY ANGIOGRAPHY performed on 02/24/2016 CAD 20% stenosis of the 2nd marginal branch of a dominant LCx coronary artery 40% stenosis of LAD-1 99-100% stenosis of LAD-2 with thrombus and TIMI I flow  Acute left ventricular dysfunction with severe hypokinesis involving the mid and distal anterolateral wall apex and distal inferoapical segment with a global ejection fraction of 25-30%. LVEDP elevated at 26 mmHg Normal nondominant RCA Successful PCI with placement of a 3.25 x 18 mm Xience Alpine DES resulting in 0% postprocedural stenosis and TIMI-3 flow    Impression and Plan:  Erika Rios has been referred for pre-anesthesia review and clearance prior to her undergoing the planned anesthetic and procedural courses. Available labs, pertinent testing, and imaging results were personally reviewed by me. This patient has been appropriately cleared by cardiology with an overall ACCEPTABLE risk of significant perioperative cardiovascular complications.  Patient will need  a preoperative ECG performed prior to her procedure as the last tracing that we have on file for review was in 2020. Patient recently had TTE and Lexiscan performed, however she will still need a 12 lead to be reviewed prior to proceeding with the planned surgical/anesthetic course.   Based on clinical review performed today (03/11/21), barring any significant acute  changes in the patient's overall condition, it is anticipated that she will be able to proceed with the planned surgical intervention. Any acute changes in clinical condition may necessitate her procedure being postponed and/or cancelled. Patient will meet with anesthesia team (MD and/or CRNA) on the day of her procedure for preoperative evaluation/assessment. Questions regarding anesthetic course will be fielded at that time.   Pre-surgical instructions were reviewed with the patient during her PAT appointment and questions were fielded by PAT clinical staff. Patient was advised that if any questions or concerns arise prior to her procedure then she should return a call to PAT and/or her surgeon's office to discuss.  Honor Loh, MSN, APRN, FNP-C, CEN [facility]  Peri-operative Services Nurse Practitioner Phone: [phone] Fax: [phone] 03/11/21 4:28 PM  NOTE: This note has been prepared using Dragon dictation software. Despite my best ability to proofread, there is always the potential that unintentional transcriptional errors may still occur from this process.

## 2021-03-11 NOTE — Telephone Encounter (Signed)
7/5 MD appt:  Return to clinic 1 to 2 weeks after her lumpectomy for further evaluation and additional treatment planning.     Patient is scheduled for lumpectomy on 7/15.  Please schedule her to see MD in 1-2 weeks as MD recommended at last visit.

## 2021-03-15 ENCOUNTER — Ambulatory Visit
Admission: RE | Admit: 2021-03-15 | Discharge: 2021-03-15 | Disposition: A | Discharge status: Home / Self Care | Payer: 59 | Source: Ambulatory Visit | Attending: General Surgery | Admitting: General Surgery

## 2021-03-15 ENCOUNTER — Other Ambulatory Visit: Payer: Self-pay

## 2021-03-15 ENCOUNTER — Ambulatory Visit: Payer: 59 | Admitting: Urgent Care

## 2021-03-15 ENCOUNTER — Encounter
Admission: RE | Disposition: A | Discharge status: Home / Self Care | Payer: Self-pay | Source: Home / Self Care | Attending: General Surgery

## 2021-03-15 ENCOUNTER — Encounter: Payer: Self-pay | Admitting: General Surgery

## 2021-03-15 ENCOUNTER — Ambulatory Visit
Admission: RE | Admit: 2021-03-15 | Discharge: 2021-03-15 | Disposition: A | Discharge status: Home / Self Care | Payer: 59 | Attending: General Surgery | Admitting: General Surgery

## 2021-03-15 DIAGNOSIS — Z9049 Acquired absence of other specified parts of digestive tract: Secondary | ICD-10-CM | POA: Diagnosis not present

## 2021-03-15 DIAGNOSIS — Z79899 Other long term (current) drug therapy: Secondary | ICD-10-CM | POA: Insufficient documentation

## 2021-03-15 DIAGNOSIS — Z885 Allergy status to narcotic agent status: Secondary | ICD-10-CM | POA: Diagnosis not present

## 2021-03-15 DIAGNOSIS — Z91040 Latex allergy status: Secondary | ICD-10-CM | POA: Diagnosis not present

## 2021-03-15 DIAGNOSIS — Z8616 Personal history of COVID-19: Secondary | ICD-10-CM | POA: Insufficient documentation

## 2021-03-15 DIAGNOSIS — Z87891 Personal history of nicotine dependence: Secondary | ICD-10-CM | POA: Insufficient documentation

## 2021-03-15 DIAGNOSIS — Z95810 Presence of automatic (implantable) cardiac defibrillator: Secondary | ICD-10-CM | POA: Diagnosis not present

## 2021-03-15 DIAGNOSIS — Z888 Allergy status to other drugs, medicaments and biological substances status: Secondary | ICD-10-CM | POA: Diagnosis not present

## 2021-03-15 DIAGNOSIS — C50912 Malignant neoplasm of unspecified site of left female breast: Secondary | ICD-10-CM

## 2021-03-15 DIAGNOSIS — Z955 Presence of coronary angioplasty implant and graft: Secondary | ICD-10-CM | POA: Insufficient documentation

## 2021-03-15 DIAGNOSIS — Z7982 Long term (current) use of aspirin: Secondary | ICD-10-CM | POA: Insufficient documentation

## 2021-03-15 DIAGNOSIS — Z886 Allergy status to analgesic agent status: Secondary | ICD-10-CM | POA: Insufficient documentation

## 2021-03-15 HISTORY — PX: BREAST LUMPECTOMY WITH NEEDLE LOCALIZATION: SHX5759

## 2021-03-15 HISTORY — DX: Ischemic cardiomyopathy: I25.5

## 2021-03-15 HISTORY — DX: Chronic obstructive pulmonary disease, unspecified: J44.9

## 2021-03-15 HISTORY — DX: Dyspnea, unspecified: R06.00

## 2021-03-15 HISTORY — DX: Type 2 diabetes mellitus without complications: E11.9

## 2021-03-15 HISTORY — DX: Atherosclerotic heart disease of native coronary artery without angina pectoris: I25.10

## 2021-03-15 HISTORY — DX: Essential (primary) hypertension: I10

## 2021-03-15 HISTORY — DX: Hyperlipidemia, unspecified: E78.5

## 2021-03-15 HISTORY — DX: Other specified health status: Z78.9

## 2021-03-15 HISTORY — PX: BREAST LUMPECTOMY: SHX2

## 2021-03-15 HISTORY — DX: Other forms of dyspnea: R06.09

## 2021-03-15 LAB — GLUCOSE, CAPILLARY: Glucose-Capillary: 144 mg/dL — ABNORMAL HIGH (ref 70–99)

## 2021-03-15 SURGERY — BREAST LUMPECTOMY WITH NEEDLE LOCALIZATION
Anesthesia: General | Laterality: Left

## 2021-03-15 MED ORDER — LACTATED RINGERS IV SOLN: INTRAVENOUS | Status: DC

## 2021-03-15 MED ORDER — ONDANSETRON HCL 4 MG/2ML IJ SOLN
INTRAMUSCULAR | Status: DC | PRN
  Administered 2021-03-15: 4 mg via INTRAVENOUS

## 2021-03-15 MED ORDER — SUGAMMADEX SODIUM 200 MG/2ML IV SOLN
INTRAVENOUS | Status: DC | PRN
  Administered 2021-03-15: 188.6 mg via INTRAVENOUS

## 2021-03-15 MED ORDER — PHENYLEPHRINE HCL (PRESSORS) 10 MG/ML IV SOLN
INTRAVENOUS | Status: AC
  Filled 2021-03-15: qty 1

## 2021-03-15 MED ORDER — APREPITANT 40 MG PO CAPS
ORAL_CAPSULE | ORAL | Status: AC
  Administered 2021-03-15: 40 mg via ORAL
  Filled 2021-03-15: qty 1

## 2021-03-15 MED ORDER — FAMOTIDINE 20 MG PO TABS
ORAL_TABLET | ORAL | Status: AC
  Administered 2021-03-15: 20 mg via ORAL
  Filled 2021-03-15: qty 1

## 2021-03-15 MED ORDER — MIDAZOLAM HCL 2 MG/2ML IJ SOLN
INTRAMUSCULAR | Status: DC | PRN
  Administered 2021-03-15: 2 mg via INTRAVENOUS

## 2021-03-15 MED ORDER — FENTANYL CITRATE (PF) 100 MCG/2ML IJ SOLN
INTRAMUSCULAR | Status: AC
  Filled 2021-03-15: qty 2

## 2021-03-15 MED ORDER — PROPOFOL 10 MG/ML IV BOLUS
INTRAVENOUS | Status: AC
  Filled 2021-03-15: qty 20

## 2021-03-15 MED ORDER — HYDROCODONE-ACETAMINOPHEN 5-325 MG PO TABS: 1.0000 | ORAL_TABLET | ORAL | 0 refills | Status: AC | PRN

## 2021-03-15 MED ORDER — BUPIVACAINE-EPINEPHRINE (PF) 0.5% -1:200000 IJ SOLN
INTRAMUSCULAR | Status: AC
  Filled 2021-03-15: qty 30

## 2021-03-15 MED ORDER — DEXAMETHASONE SODIUM PHOSPHATE 10 MG/ML IJ SOLN
INTRAMUSCULAR | Status: DC | PRN
  Administered 2021-03-15: 10 mg via INTRAVENOUS

## 2021-03-15 MED ORDER — METHYLENE BLUE 0.5 % INJ SOLN
INTRAVENOUS | Status: DC | PRN
  Administered 2021-03-15: 2 mL via SUBMUCOSAL

## 2021-03-15 MED ORDER — ORAL CARE MOUTH RINSE: 15.0000 mL | Freq: Once | OROMUCOSAL | Status: AC

## 2021-03-15 MED ORDER — FENTANYL CITRATE (PF) 100 MCG/2ML IJ SOLN
INTRAMUSCULAR | Status: DC | PRN
  Administered 2021-03-15 (×2): 50 ug via INTRAVENOUS

## 2021-03-15 MED ORDER — ETOMIDATE 2 MG/ML IV SOLN
INTRAVENOUS | Status: DC | PRN
  Administered 2021-03-15: 20 mg via INTRAVENOUS

## 2021-03-15 MED ORDER — APREPITANT 40 MG PO CAPS: 40.0000 mg | ORAL_CAPSULE | Freq: Once | ORAL | Status: AC

## 2021-03-15 MED ORDER — CHLORHEXIDINE GLUCONATE 0.12 % MT SOLN: 15.0000 mL | Freq: Once | OROMUCOSAL | Status: AC

## 2021-03-15 MED ORDER — LACTATED RINGERS IV SOLN: INTRAVENOUS | Status: DC | PRN

## 2021-03-15 MED ORDER — CEFAZOLIN SODIUM-DEXTROSE 2-4 GM/100ML-% IV SOLN
2.0000 g | INTRAVENOUS | Status: AC
  Administered 2021-03-15: 2 g via INTRAVENOUS

## 2021-03-15 MED ORDER — BUPIVACAINE-EPINEPHRINE (PF) 0.5% -1:200000 IJ SOLN
INTRAMUSCULAR | Status: DC | PRN
  Administered 2021-03-15: 30 mL

## 2021-03-15 MED ORDER — ACETAMINOPHEN 10 MG/ML IV SOLN
INTRAVENOUS | Status: DC | PRN
  Administered 2021-03-15: 1000 mg via INTRAVENOUS

## 2021-03-15 MED ORDER — FENTANYL CITRATE (PF) 100 MCG/2ML IJ SOLN: 25.0000 ug | INTRAMUSCULAR | Status: DC | PRN

## 2021-03-15 MED ORDER — EPHEDRINE SULFATE 50 MG/ML IJ SOLN
INTRAMUSCULAR | Status: DC | PRN
  Administered 2021-03-15 (×4): 10 mg via INTRAVENOUS

## 2021-03-15 MED ORDER — FAMOTIDINE 20 MG PO TABS: 20.0000 mg | ORAL_TABLET | Freq: Once | ORAL | Status: AC

## 2021-03-15 MED ORDER — LIDOCAINE HCL (CARDIAC) PF 100 MG/5ML IV SOSY
PREFILLED_SYRINGE | INTRAVENOUS | Status: DC | PRN
  Administered 2021-03-15: 100 mg via INTRAVENOUS

## 2021-03-15 MED ORDER — ROCURONIUM BROMIDE 100 MG/10ML IV SOLN
INTRAVENOUS | Status: DC | PRN
  Administered 2021-03-15: 50 mg via INTRAVENOUS
  Administered 2021-03-15: 10 mg via INTRAVENOUS

## 2021-03-15 MED ORDER — ACETAMINOPHEN 10 MG/ML IV SOLN
INTRAVENOUS | Status: AC
  Filled 2021-03-15: qty 100

## 2021-03-15 MED ORDER — CEFAZOLIN SODIUM-DEXTROSE 2-4 GM/100ML-% IV SOLN
INTRAVENOUS | Status: AC
  Filled 2021-03-15: qty 100

## 2021-03-15 MED ORDER — CHLORHEXIDINE GLUCONATE CLOTH 2 % EX PADS: 6.0000 | MEDICATED_PAD | Freq: Once | CUTANEOUS | Status: DC

## 2021-03-15 MED ORDER — MIDAZOLAM HCL 2 MG/2ML IJ SOLN
INTRAMUSCULAR | Status: AC
  Filled 2021-03-15: qty 2

## 2021-03-15 MED ORDER — CHLORHEXIDINE GLUCONATE 0.12 % MT SOLN
OROMUCOSAL | Status: AC
  Administered 2021-03-15: 15 mL via OROMUCOSAL
  Filled 2021-03-15: qty 15

## 2021-03-15 MED ORDER — STERILE WATER FOR IRRIGATION IR SOLN
Status: DC | PRN
  Administered 2021-03-15: 1000 mL

## 2021-03-15 MED ORDER — METHYLENE BLUE 0.5 % INJ SOLN
INTRAVENOUS | Status: AC
  Filled 2021-03-15: qty 10

## 2021-03-15 SURGICAL SUPPLY — 54 items
APL PRP STRL LF DISP 70% ISPRP (MISCELLANEOUS) ×1
BINDER BREAST XXLRG (GAUZE/BANDAGES/DRESSINGS) ×2 IMPLANT
BLADE BOVIE TIP EXT 4 (BLADE) ×2 IMPLANT
BLADE SURG 15 STRL SS SAFETY (BLADE) ×4 IMPLANT
BULB RESERV EVAC DRAIN JP 100C (MISCELLANEOUS) ×2 IMPLANT
CHLORAPREP W/TINT 26 (MISCELLANEOUS) ×2 IMPLANT
CLIP VESOCCLUDE MED 6/CT (CLIP) ×2 IMPLANT
CNTNR SPEC 2.5X3XGRAD LEK (MISCELLANEOUS)
CONT SPEC 4OZ STER OR WHT (MISCELLANEOUS)
CONT SPEC 4OZ STRL OR WHT (MISCELLANEOUS)
CONTAINER SPEC 2.5X3XGRAD LEK (MISCELLANEOUS) IMPLANT
COVER PROBE FLX POLY STRL (MISCELLANEOUS) ×2 IMPLANT
DEVICE DUBIN SPECIMEN MAMMOGRA (MISCELLANEOUS) ×2 IMPLANT
DRAIN CHANNEL JP 15F RND 16 (MISCELLANEOUS) ×2 IMPLANT
DRAPE LAPAROTOMY TRNSV 106X77 (MISCELLANEOUS) ×2 IMPLANT
DRSG GAUZE FLUFF 36X18 (GAUZE/BANDAGES/DRESSINGS) ×2 IMPLANT
DRSG TELFA 3X8 NADH (GAUZE/BANDAGES/DRESSINGS) ×2 IMPLANT
ELECT CAUTERY BLADE TIP 2.5 (TIP) ×2
ELECT REM PT RETURN 9FT ADLT (ELECTROSURGICAL) ×2
ELECTRODE CAUTERY BLDE TIP 2.5 (TIP) ×1 IMPLANT
ELECTRODE REM PT RTRN 9FT ADLT (ELECTROSURGICAL) ×1 IMPLANT
GAUZE 4X4 16PLY ~~LOC~~+RFID DBL (SPONGE) ×2 IMPLANT
GLOVE SURG ENC MOIS LTX SZ7.5 (GLOVE) ×2 IMPLANT
GLOVE SURG UNDER LTX SZ8 (GLOVE) ×2 IMPLANT
GOWN STRL REUS W/ TWL LRG LVL3 (GOWN DISPOSABLE) ×2 IMPLANT
GOWN STRL REUS W/TWL LRG LVL3 (GOWN DISPOSABLE) ×4
KIT MARKER MARGIN INK (KITS) ×2 IMPLANT
KIT TURNOVER KIT A (KITS) ×2 IMPLANT
LABEL OR SOLS (LABEL) ×2 IMPLANT
MANIFOLD NEPTUNE II (INSTRUMENTS) ×2 IMPLANT
MARGIN MAP 10MM (MISCELLANEOUS) ×2 IMPLANT
NEEDLE HYPO 22GX1.5 SAFETY (NEEDLE) ×2 IMPLANT
NEEDLE HYPO 25X1 1.5 SAFETY (NEEDLE) ×4 IMPLANT
NEEDLE SPNL 20GX3.5 QUINCKE YW (NEEDLE) ×2 IMPLANT
PACK BASIN MINOR ARMC (MISCELLANEOUS) ×2 IMPLANT
RETRACTOR RING XSMALL (MISCELLANEOUS) IMPLANT
RTRCTR WOUND ALEXIS 13CM XS SH (MISCELLANEOUS)
SHEARS FOC LG CVD HARMONIC 17C (MISCELLANEOUS) ×2 IMPLANT
STRIP CLOSURE SKIN 1/2X4 (GAUZE/BANDAGES/DRESSINGS) ×2 IMPLANT
SUT ETHILON 3-0 FS-10 30 BLK (SUTURE) ×4
SUT SILK 2 0 (SUTURE) ×2
SUT SILK 2-0 18XBRD TIE 12 (SUTURE) ×1 IMPLANT
SUT VIC AB 2-0 CT1 27 (SUTURE) ×14
SUT VIC AB 2-0 CT1 TAPERPNT 27 (SUTURE) ×7 IMPLANT
SUT VIC AB 3-0 SH 27 (SUTURE) ×4
SUT VIC AB 3-0 SH 27X BRD (SUTURE) ×2 IMPLANT
SUT VIC AB 4-0 FS2 27 (SUTURE) ×4 IMPLANT
SUT VICRYL+ 3-0 144IN (SUTURE) ×2 IMPLANT
SUTURE EHLN 3-0 FS-10 30 BLK (SUTURE) ×2 IMPLANT
SWABSTK COMLB BENZOIN TINCTURE (MISCELLANEOUS) ×2 IMPLANT
SYR 10ML LL (SYRINGE) ×2 IMPLANT
SYR BULB IRRIG 60ML STRL (SYRINGE) ×2 IMPLANT
TAPE TRANSPORE STRL 2 31045 (GAUZE/BANDAGES/DRESSINGS) ×2 IMPLANT
WATER STERILE IRR 1000ML POUR (IV SOLUTION) ×2 IMPLANT

## 2021-03-15 NOTE — H&P (Signed)
Erika Rios [id number] Dec 07, 1960     HPI: 60 year old woman with severe cardiac compromise from cardiomyopathy recently identified with 2 foci of invasive mammary carcinoma in the left breast as well as axillary metastasis.  She is felt to be a candidate for wide excision of the remaining breast parenchyma within the left breast, extensive calcifications between the 2 primaries, and primary axillary dissection.  Her cardiac status may preclude full chemotherapy, and for that reason axillary dissection has been chosen over neoadjuvant chemotherapy.  She underwent wire localization of both sites this morning and coordination with the radiologist was undertaken.  Coordination with anesthesia has been undertaken due to the patient's implanted defibrillator.  The device had been interrogated by the company the day of the procedure and it was found that she is not paced on her most recent exam.   Medications Prior to Admission  Medication Sig Dispense Refill Last Dose   acetaminophen (TYLENOL) 500 MG tablet Take 2 tablets (1,000 mg total) by mouth every 6 (six) hours as needed for mild pain, moderate pain or headache. 30 tablet 0 Past Week   albuterol (VENTOLIN HFA) 108 (90 Base) MCG/ACT inhaler Inhale 1-2 puffs into the lungs every 6 (six) hours as needed for wheezing or shortness of breath.   Past Week   aspirin EC 325 MG EC tablet Take 1 tablet (325 mg total) by mouth 2 (two) times daily. (Patient taking differently: Take 325 mg by mouth daily.) 30 tablet 0 03/15/2021   carvedilol (COREG) 3.125 MG tablet Take 3.125 mg by mouth 2 (two) times daily with a meal.   03/15/2021   ibuprofen (ADVIL) 200 MG tablet Take 600 mg by mouth at bedtime as needed for headache or moderate pain.   Past Month   letrozole (FEMARA) 2.5 MG tablet Take 1 tablet (2.5 mg total) by mouth daily. 90 tablet 3 03/15/2021   lisinopril (ZESTRIL) 5 MG tablet Take 5 mg by mouth daily.   03/15/2021   magnesium oxide (MAG-OX) 400 MG  tablet Take 400 mg by mouth daily.   03/15/2021   nitroGLYCERIN (NITROSTAT) 0.4 MG SL tablet PLACE 1 TABLET UNDER TONGUE EVERY 5 MINUTES IF NEEDED FOR CHEST PAIN UP TO 3 DOSES (Patient taking differently: Place 0.4 mg under the tongue every 5 (five) minutes as needed for chest pain.) 25 tablet 5 Past Month   Potassium 99 MG TABS Take 198 mg by mouth every evening.   03/14/2021   rosuvastatin (CRESTOR) 5 MG tablet Take 5 mg by mouth 2 (two) times a week. Monday and Tuesday morning   Past Week   spironolactone (ALDACTONE) 25 MG tablet Take 0.5 tablets (12.5 mg total) by mouth daily. (Patient taking differently: Take 12.5 mg by mouth every evening.) 30 tablet 6 03/14/2021   triamcinolone cream (KENALOG) 0.1 % Apply 1 application topically daily as needed (rash).   3 03/14/2021   vitamin B-12 (CYANOCOBALAMIN) 250 MCG tablet Take 250 mcg by mouth daily.   03/15/2021   lisinopril (PRINIVIL,ZESTRIL) 2.5 MG tablet Take 1 tablet (2.5 mg total) by mouth daily. PLEASE CONTACT OFFICE FOR ADDITIONAL REFILLS (Patient not taking: Reported on 03/06/2021) 30 tablet 0 Not Taking   Allergies  Allergen Reactions   Statins Rash and Other (See Comments)    Hip pain, constant    Elastic Bandages & [Zinc] Rash    Paper tape should be okay   Entresto [Sacubitril-Valsartan] Palpitations   Iron Nausea And Vomiting   Latex Dermatitis   Nsaids  Told not to take d/t heart status   Oxycodone Anxiety   Past Medical History:  Diagnosis Date   AICD (automatic cardioverter/defibrillator) present 10/22/2018   CAD (coronary artery disease)    CHF (congestive heart failure) (HCC)    COPD (chronic obstructive pulmonary disease) (HCC)    DOE (dyspnea on exertion)    Family history of adverse reaction to anesthesia    (+) 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   GERD (gastroesophageal reflux disease)    History of 2019 novel coronavirus disease (COVID-19) 04/23/2020   HLD (hyperlipidemia)    HTN (hypertension)    Invasive  carcinoma of breast ([place]) 01/23/2021   LEFT invasive mammary carcinoma; ER/PR (+), HER2/neu (+)   Ischemic cardiomyopathy    s/p AICD placement   ST elevation myocardial infarction (STEMI) of anterior wall, initial episode of care ([place]) 02/24/2016   DES x 1 to pLAD   Statin intolerance    T2DM (type 2 diabetes mellitus) ([place])    Tobacco abuse    quit smoking 2017   Past Surgical History:  Procedure Laterality Date   BREAST BIOPSY Right 12/10/2016   neg bx FIBROADENOMATOUS CHANGES WITH STROMAL AND LUMINAL CALCIFICATIONS. NEGATIVE   BREAST LUMPECTOMY Left 03/15/2021   lumpectomy with NL bracketing   CARDIAC CATHETERIZATION N/A 02/24/2016   Procedure: Left Heart Cath and Coronary Angiography;  Surgeon: Troy Sine, MD;  Location: [place] [facility];  Service: Cardiovascular;  Laterality: N/A;   CARDIAC CATHETERIZATION N/A 02/24/2016   Procedure: Coronary Stent Intervention (3.2518 mm Xience Alpine DES stent);  Surgeon: Troy Sine, MD;  Location: [place] [facility];  Service: Cardiovascular   CHOLECYSTECTOMY N/A 02/02/2019   Procedure: LAPAROSCOPIC CHOLECYSTECTOMY - LATEX ALLERGY;  Surgeon: Olean Ree, MD;  Location: [facility];  Service: General;  Laterality: N/A;   IMPLANTABLE CARDIOVERTER DEFIBRILLATOR (ICD) GENERATOR CHANGE Left 10/22/2018   Procedure: ICD GENERATOR INSERTION;  Surgeon: Marzetta Board, MD;  Location: [facility];  Service: Cardiovascular;  Laterality: Left;   IR BILIARY DRAIN PLACEMENT WITH CHOLANGIOGRAM     Social History   Socioeconomic History   Marital status: Married    Spouse name: greg   Number of children: Not on file   Years of education: Not on file   Highest education level: Not on file  Occupational History   Occupation: mail lady!!  Tobacco Use   Smoking status: Former    Packs/day: 0.50    Years: 34.00    Pack years: 17.00    Types: Cigarettes    Quit date: 03/19/2016    Years since quitting: 4.9   Smokeless tobacco: Never  Vaping Use    Vaping Use: Never used  Substance and Sexual Activity   Alcohol use: Yes    Alcohol/week: 0.0 standard drinks    Comment: occ   Drug use: No   Sexual activity: Not on file  Other Topics Concern   Not on file  Social History Narrative   Not on file   Social Determinants of Health   Financial Resource Strain: Not on file  Food Insecurity: Not on file  Transportation Needs: Not on file  Physical Activity: Not on file  Stress: Not on file  Social Connections: Not on file  Intimate Partner Violence: Not on file   Social History   Social History Narrative   Not on file     ROS: Negative.     PE: HEENT: Negative. Lungs: Clear. Cardio: RR.   Assessment/Plan:  Proceed with  planned wide excision left breast with axillary dissection. Forest Gleason [facility] 03/15/2021

## 2021-03-15 NOTE — Op Note (Addendum)
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  Postoperative diagnosis: Same.  Operative procedure: Wire localization for en bloc resection of the medial aspect of the left breast with axillary dissection, tissue transfer technique.  Ultrasound guidance.  Operating Surgeon: Hervey Ard, MD.  Anesthesia: General endotracheal, Marcaine 0.5% with 1: 200,000 units of epinephrine, 30 cc total.  20 used for a pectoralis block.  Estimated blood loss: Less than 30 cc.  Clinical note this 60 year old woman had an abnormal mammogram with 2 areas of concern for cancer and subsequent biopsy of both areas and an axillary lymph node.  She has been found to have triple positive disease.  She has significant cardiomyopathy that precluded neoadjuvant chemotherapy.  She was felt to be best served by primary surgical therapy.  Based on her diminished cardiac function she would be best served by wide local excision and axillary dissection.  The patient received Ancef prior to the procedure.  SCD stockings for DVT prevention.  Operative report: The patient underwent general anesthesia after deactivation of her pacemaker defibrillator.  No cardiac events before during or after the procedure.  2 cc of 0.5% methylene blue was injected in the upper inner aspect of the left arm for reverse lymphatic mapping to minimize the risk of lymphedema.  The breast was taped in the medial position and the previously placed localizing wires protected.  The breast chest and axilla was then cleansed with ChloraPrep and draped. Ultrasound was used to identify the limits of the major tumor mass in the upper outer quadrant as well as the localizing wires to guide margins for resection. Photo taken for the permanent record.  An elliptical incision was made from the edge of the areola at the 3 o'clock position for a distance of covering 20 cm to just about the posterior axillary  line.  This was based with ultrasound guidance of the tumor mass as well as identification of the previously placed localizing wires to encompass the biopsy clips placed earlier.  The ellipse was incised circumferentially with a knife and the remaining dissection completed with electrocautery.  This was carried straight down to the pectoralis fascia which was taken with the specimen.  The specimen was inked in the operating room due to its large volume with medial and lateral metallic tags.  This was sent fresh to pathology with report from the pathologist that there was a small area of suspected tumor on ink on the superior border and a close area on the posterior or deep side where the pectoralis fashion been removed.  While the breast specimen was being processed attention was turned to the axilla.  The axillary envelope was opened.  The axillary vein was identified and then the contents inferior to this were swept down and laterally.  The long thoracic nerve of Bell and thoracodorsal nerve artery and vein bundle were identified and protected.  The intercostal brachial nerve was divided.  Hemostasis was electrocautery, 2-0 silk ties and the harmonic scalpel.  A 15 [place] Blake drain was brought out through a separate incision in the lower medial breast and anchored into place with a 3-0 nylon suture.  The axillary envelope was then closed with interrupted 2-0 Vicryl figure-of-eight sutures.  The breast parenchyma again encompassing the wound approximately 8 x 20 cm in size was approximated with multiple layers of 2-0 Vicryl figure-of-eight sutures.  The most superficial layer included the subcutaneous fat.  The skin was then closed with a running 4-0  Vicryl subcuticular suture.  Benzoin, Steri-Strips, Telfa fluff gauze and a compressive wrap was then applied.  The pacemaker defibrillator was reactivated by the [employer] representative in the operating room and the patient was taken to the recovery room in  stable condition.

## 2021-03-15 NOTE — Anesthesia Preprocedure Evaluation (Signed)
Anesthesia Evaluation  Patient identified by MRN, date of birth, ID band Patient awake    Reviewed: Allergy & Precautions, NPO status , Patient's Chart, lab work & pertinent test results  History of Anesthesia Complications (+) Family history of anesthesia reaction and history of anesthetic complications  Airway Mallampati: III  TM Distance: <3 FB Neck ROM: full    Dental  (+) Chipped, Poor Dentition   Pulmonary COPD, former smoker,    Pulmonary exam normal        Cardiovascular hypertension, (-) angina+ CAD, + Past MI, + Cardiac Stents, +CHF and + DOE  Normal cardiovascular exam+ pacemaker + Cardiac Defibrillator      Neuro/Psych negative neurological ROS  negative psych ROS   GI/Hepatic Neg liver ROS, GERD  Medicated and Controlled,  Endo/Other  diabetes, Type 2  Renal/GU      Musculoskeletal   Abdominal   Peds  Hematology negative hematology ROS (+)   Anesthesia Other Findings Past Medical History: 10/22/2018: AICD (automatic cardioverter/defibrillator) present No date: CAD (coronary artery disease) No date: CHF (congestive heart failure) (HCC) No date: COPD (chronic obstructive pulmonary disease) ([place]) No date: DOE (dyspnea on exertion) No date: Family history of adverse reaction to anesthesia     Comment:  (+) 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 No date: GERD (gastroesophageal reflux disease) 04/23/2020: History of 2019 novel coronavirus disease (COVID-19) No date: HLD (hyperlipidemia) No date: HTN (hypertension) 01/23/2021: Invasive carcinoma of breast ([place])     Comment:  LEFT invasive mammary carcinoma; ER/PR (+), HER2/neu (+) No date: Ischemic cardiomyopathy     Comment:  s/p AICD placement 02/24/2016: ST elevation myocardial infarction (STEMI) of anterior  wall, initial episode of care [facility])     Comment:  DES x 1 to pLAD No date: Statin intolerance No date: T2DM (type 2 diabetes mellitus)  ([place]) No date: Tobacco abuse     Comment:  quit smoking 2017  Past Surgical History: 12/10/2016: BREAST BIOPSY; Right     Comment:  neg bx FIBROADENOMATOUS CHANGES WITH STROMAL AND LUMINAL              CALCIFICATIONS. NEGATIVE 03/15/2021: BREAST LUMPECTOMY; Left     Comment:  lumpectomy with NL bracketing 02/24/2016: CARDIAC CATHETERIZATION; N/A     Comment:  Procedure: Left Heart Cath and Coronary Angiography;                Surgeon: Troy Sine, MD;  Location: [place] [facility]               [facility];  Service: Cardiovascular;  Laterality: N/A; 02/24/2016: CARDIAC CATHETERIZATION; N/A     Comment:  Procedure: Coronary Stent Intervention (3.2518 mm               Xience Alpine DES stent);  Surgeon: Troy Sine, MD;                Location: [place] [facility];  Service: Cardiovascular 02/02/2019: CHOLECYSTECTOMY; N/A     Comment:  Procedure: LAPAROSCOPIC CHOLECYSTECTOMY - LATEX ALLERGY;              Surgeon: Olean Ree, MD;  Location: [facility];                Service: General;  Laterality: N/A; 10/22/2018: IMPLANTABLE CARDIOVERTER DEFIBRILLATOR (ICD) GENERATOR  CHANGE; Left     Comment:  Procedure: ICD GENERATOR INSERTION;  Surgeon: Marzetta Board,  MD;  Location: [facility];  Service:               Cardiovascular;  Laterality: Left; No date: IR BILIARY DRAIN PLACEMENT WITH CHOLANGIOGRAM     Reproductive/Obstetrics negative OB ROS                             Anesthesia Physical Anesthesia Plan  ASA: 3  Anesthesia Plan: General ETT   Post-op Pain Management:    Induction: Intravenous  PONV Risk Score and Plan: Ondansetron, Dexamethasone, Midazolam and Treatment may vary due to age or medical condition  Airway Management Planned: Oral ETT  Additional Equipment:   Intra-op Plan:   Post-operative Plan: Extubation in OR  Informed Consent: I have reviewed the patients History and Physical, chart, labs and discussed the procedure  including the risks, benefits and alternatives for the proposed anesthesia with the patient or authorized representative who has indicated his/her understanding and acceptance.     Dental Advisory Given  Plan Discussed with: Anesthesiologist, CRNA and Surgeon  Anesthesia Plan Comments: (Plan to reprogram ICD to off for procedure and reprogram back to on after procedure.  Patient is not pacemaker dependent so not reprogramming pacemaker.  Patient consented for risks of anesthesia including but not limited to:  - adverse reactions to medications - damage to eyes, teeth, lips or other oral mucosa - nerve damage due to positioning  - sore throat or hoarseness - Damage to heart, brain, nerves, lungs, other parts of body or loss of life  Patient voiced understanding.)        Anesthesia Quick Evaluation

## 2021-03-15 NOTE — Anesthesia Postprocedure Evaluation (Signed)
Anesthesia Post Note  Patient: Erika Rios  Procedure(s) Performed: BREAST LUMPECTOMY WITH NEEDLE LOCALIZATION (Left)  Patient location during evaluation: PACU Anesthesia Type: General Level of consciousness: awake and alert Pain management: pain level controlled Vital Signs Assessment: post-procedure vital signs reviewed and stable Respiratory status: spontaneous breathing, nonlabored ventilation, respiratory function stable and patient connected to nasal cannula oxygen Cardiovascular status: blood pressure returned to baseline and stable Postop Assessment: no apparent nausea or vomiting Anesthetic complications: no Comments: ICD was reactivated by [employer] rep in the OR at the end of the case   No notable events documented.   Last Vitals:  Vitals:   03/15/21 1715 03/15/21 1740  BP: 136/74 136/74  Pulse: 73 73  Resp: 16 16  Temp: (!) 36.3 C (!) 36.3 C  SpO2: 93% 93%    Last Pain:  Vitals:   03/15/21 1740  TempSrc: Temporal  PainSc: 0-No pain                 Precious Haws Genene Kilman

## 2021-03-15 NOTE — Anesthesia Procedure Notes (Signed)
Procedure Name: Intubation Date/Time: 03/15/2021 2:02 PM Performed by: Timoteo Expose, CRNA Pre-anesthesia Checklist: Patient identified, Emergency Drugs available, Suction available, Patient being monitored and Timeout performed Patient Re-evaluated:Patient Re-evaluated prior to induction Oxygen Delivery Method: Circle system utilized Preoxygenation: Pre-oxygenation with 100% oxygen Induction Type: IV induction Laryngoscope Size: McGraph and 3 Grade View: Grade I Tube type: Oral Placement Confirmation: ETT inserted through vocal cords under direct vision Secured at: 21 cm Tube secured with: Tape

## 2021-03-15 NOTE — Discharge Instructions (Signed)

## 2021-03-15 NOTE — Transfer of Care (Signed)
Immediate Anesthesia Transfer of Care Note  Patient: BRITTNIE LEWEY  Procedure(s) Performed: BREAST LUMPECTOMY WITH NEEDLE LOCALIZATION (Left)  Patient Location: PACU  Anesthesia Type:General  Level of Consciousness: awake and alert   Airway & Oxygen Therapy: Patient Spontanous Breathing and Patient connected to face mask oxygen  Post-op Assessment: Report given to RN and Post -op Vital signs reviewed and stable  Post vital signs: Reviewed and stable  Last Vitals:  Vitals Value Taken Time  BP 156/80 03/15/21 1647  Temp    Pulse 85 03/15/21 1651  Resp 13 03/15/21 1651  SpO2 96 % 03/15/21 1651  Vitals shown include unvalidated device data.  Last Pain:  Vitals:   03/15/21 1138  TempSrc:   PainSc: 0-No pain         Complications: No notable events documented.

## 2021-03-16 ENCOUNTER — Encounter: Payer: Self-pay | Admitting: General Surgery

## 2021-03-20 ENCOUNTER — Other Ambulatory Visit: Payer: Self-pay | Admitting: Oncology

## 2021-03-25 ENCOUNTER — Other Ambulatory Visit: Payer: Self-pay | Admitting: Oncology

## 2021-03-25 NOTE — Progress Notes (Signed)
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  INTERVAL HISTORY: Patient returns to clinic today for further evaluation, discussion of her pathology results, and treatment planning.  She recently underwent lumpectomy with axillary node dissection and tolerated the procedure well.  Previously, she decided to forego reconstruction.  She continues to take letrozole with only occasional hot flashes that do not disrupt her day-to-day activity.  She otherwise feels well and is asymptomatic.  She has no neurologic complaints.  She denies any recent fevers or illnesses.  She has a good appetite and denies weight loss.  She has no chest pain, shortness of breath, cough, or hemoptysis.  She denies any nausea, vomiting, constipation, or diarrhea.  She has no urinary complaints.  Patient offers no further specific complaints today.    REVIEW OF SYSTEMS:   Review of Systems  Constitutional: Negative.  Negative for fever, malaise/fatigue and weight loss.  Respiratory: Negative.  Negative for cough, hemoptysis and shortness of breath.   Cardiovascular: Negative.  Negative for chest pain and leg swelling.  Gastrointestinal: Negative.  Negative for abdominal pain.  Genitourinary: Negative.  Negative for frequency.  Musculoskeletal: Negative.  Negative for back pain.  Skin: Negative.  Negative for rash.  Neurological:  Positive for sensory change. Negative for dizziness, focal weakness, weakness and headaches.  Psychiatric/Behavioral: Negative.  The patient is not nervous/anxious.    As per HPI. Otherwise, a  complete review of systems is negative.  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   GERD (gastroesophageal reflux disease)    History of 2019 novel coronavirus disease (COVID-19) 04/23/2020   HLD (hyperlipidemia)    HTN (hypertension)    Invasive carcinoma of breast ([place]) 01/23/2021   LEFT invasive mammary carcinoma; ER/PR (+), HER2/neu (+)   Ischemic cardiomyopathy    s/p AICD placement   ST elevation myocardial infarction (STEMI) of anterior wall, initial episode of care ([place]) 02/24/2016   DES x 1 to pLAD   Statin intolerance    T2DM (type 2 diabetes mellitus) ([place])    Tobacco abuse    quit smoking 2017    PAST SURGICAL HISTORY: Past Surgical History:  Procedure Laterality Date   BREAST BIOPSY Right 12/10/2016   neg bx FIBROADENOMATOUS CHANGES WITH STROMAL AND LUMINAL CALCIFICATIONS. NEGATIVE   BREAST LUMPECTOMY Left 03/15/2021   lumpectomy with NL bracketing   BREAST LUMPECTOMY WITH NEEDLE LOCALIZATION Left 03/15/2021   Procedure: BREAST LUMPECTOMY WITH NEEDLE LOCALIZATION;  Surgeon: Robert Bellow, MD;  Location: [facility];  Service: General;  Laterality: Left;   CARDIAC CATHETERIZATION N/A 02/24/2016   Procedure: Left Heart Cath and Coronary Angiography;  Surgeon: Troy Sine, MD;  Location: [place] [facility];  Service: Cardiovascular;  Laterality: N/A;   CARDIAC CATHETERIZATION N/A 02/24/2016   Procedure: Coronary Stent Intervention (3.2518 mm  Xience Alpine DES stent);  Surgeon: Troy Sine, MD;  Location: [facility] [facility];  Service: Cardiovascular   CHOLECYSTECTOMY N/A 02/02/2019   Procedure: LAPAROSCOPIC CHOLECYSTECTOMY - LATEX  ALLERGY;  Surgeon: Olean Ree, MD;  Location: [facility];  Service: General;  Laterality: N/A;   IMPLANTABLE CARDIOVERTER DEFIBRILLATOR (ICD) GENERATOR CHANGE Left 10/22/2018   Procedure: ICD GENERATOR INSERTION;  Surgeon: Marzetta Board, MD;  Location: [facility];  Service: Cardiovascular;  Laterality: Left;   IR BILIARY DRAIN PLACEMENT WITH CHOLANGIOGRAM      FAMILY HISTORY: Family History  Problem Relation Age of Onset   Cancer Mother    Heart disease Mother    Heart attack Mother    Breast cancer Mother    Diabetes Father     ADVANCED DIRECTIVES (Y/N):  N  HEALTH MAINTENANCE: Social History   Tobacco Use   Smoking status: Former    Packs/day: 0.50    Years: 34.00    Pack years: 17.00    Types: Cigarettes    Quit date: 03/19/2016    Years since quitting: 5.0   Smokeless tobacco: Never  Vaping Use   Vaping Use: Never used  Substance Use Topics   Alcohol use: Yes    Alcohol/week: 0.0 standard drinks    Comment: occ   Drug use: No     Colonoscopy:  PAP:  Bone density:  Lipid panel:  Allergies  Allergen Reactions   Statins Rash and Other (See Comments)    Hip pain, constant    Elastic Bandages & [Zinc] Rash    Paper tape should be okay   Entresto [Sacubitril-Valsartan] Palpitations   Iron Nausea And Vomiting   Latex Dermatitis   Nsaids     Told not to take d/t heart status   Oxycodone Anxiety    Current Outpatient Medications  Medication Sig Dispense Refill   acetaminophen (TYLENOL) 500 MG tablet Take 2 tablets (1,000 mg total) by mouth every 6 (six) hours as needed for mild pain, moderate pain or headache. 30 tablet 0   albuterol (VENTOLIN HFA) 108 (90 Base) MCG/ACT inhaler Inhale 1-2 puffs into the lungs every 6 (six) hours as needed for wheezing or shortness of breath.     aspirin 325 MG EC tablet Take by mouth.     carvedilol (COREG) 3.125 MG tablet Take 3.125 mg by mouth 2 (two) times daily with a meal.     ibuprofen (ADVIL) 200 MG tablet Take 600  mg by mouth at bedtime as needed for headache or moderate pain.     letrozole (FEMARA) 2.5 MG tablet Take 1 tablet (2.5 mg total) by mouth daily. 90 tablet 3   lisinopril (ZESTRIL) 5 MG tablet Take 5 mg by mouth daily.     magnesium oxide (MAG-OX) 400 MG tablet Take 400 mg by mouth daily.     nitroGLYCERIN (NITROSTAT) 0.4 MG SL tablet PLACE 1 TABLET UNDER TONGUE EVERY 5 MINUTES IF NEEDED FOR CHEST PAIN UP TO 3 DOSES (Patient taking differently: Place 0.4 mg under the tongue every 5 (five) minutes as needed for chest pain.) 25 tablet 5   Potassium 99 MG TABS Take 198 mg by mouth every evening.     rosuvastatin (CRESTOR) 5 MG tablet Take 5 mg by mouth 2 (two) times a week. Monday and Tuesday morning     spironolactone (ALDACTONE) 25 MG tablet Take 0.5 tablets (12.5 mg total) by mouth daily. (Patient taking differently: Take 12.5 mg by mouth every evening.) 30 tablet 6  triamcinolone cream (KENALOG) 0.1 % Apply 1 application topically daily as needed (rash).   3   vitamin B-12 (CYANOCOBALAMIN) 250 MCG tablet Take 250 mcg by mouth daily.     HYDROcodone-acetaminophen (NORCO/VICODIN) 5-325 MG tablet Take 1 tablet by mouth every 4 (four) hours as needed for moderate pain. (Patient not taking: Reported on 03/27/2021) 15 tablet 0   lisinopril (PRINIVIL,ZESTRIL) 2.5 MG tablet Take 1 tablet (2.5 mg total) by mouth daily. PLEASE CONTACT OFFICE FOR ADDITIONAL REFILLS (Patient not taking: No sig reported) 30 tablet 0   No current facility-administered medications for this visit.    OBJECTIVE: Vitals:   03/27/21 1106  BP: 108/74  Pulse: 68  Resp: 16      Body mass index is 35.64 kg/m.    ECOG FS:0 - Asymptomatic  General: Well-developed, well-nourished, no acute distress. Eyes: Pink conjunctiva, anicteric sclera. HEENT: Normocephalic, moist mucous membranes. Breasts: Exam deferred today. Lungs: No audible wheezing or coughing. Heart: Regular rate and rhythm. Abdomen: Soft, nontender, no obvious  distention. Musculoskeletal: No edema, cyanosis, or clubbing. Neuro: Alert, answering all questions appropriately. Cranial nerves grossly intact. Skin: No rashes or petechiae noted. Psych: Normal affect.    LAB RESULTS:  Lab Results  Component Value Date   NA 137 02/02/2019   K 3.8 02/02/2019   CL 108 02/02/2019   CO2 21 (L) 02/02/2019   GLUCOSE 96 02/02/2019   BUN 13 02/02/2019   CREATININE 0.70 02/02/2019   CALCIUM 8.8 (L) 02/02/2019   PROT 6.8 02/02/2019   ALBUMIN 3.7 02/02/2019   AST 20 02/02/2019   ALT 25 02/02/2019   ALKPHOS 88 02/02/2019   BILITOT 0.5 02/02/2019   GFRNONAA >60 02/02/2019   GFRAA >60 02/02/2019    Lab Results  Component Value Date   WBC 9.0 02/02/2019   NEUTROABS 5.2 02/02/2019   HGB 12.7 02/02/2019   HCT 37.8 02/02/2019   MCV 99.0 02/02/2019   PLT 240 02/02/2019     STUDIES: MM BREAST SURGICAL SPECIMEN  Result Date: 03/25/2021 CLINICAL DATA:  Patient status post left breast lumpectomy. EXAM: SPECIMEN RADIOGRAPH OF THE LEFT BREAST COMPARISON:  Previous exam(s). FINDINGS: Status post excision of the left breast. The wire tips and biopsy marker clips are present and are marked for pathology. Additionally, HydroMARK clip is identified within 1 of the lymph nodes. IMPRESSION: Specimen radiograph of the left breast. Electronically Signed   By: Lovey Newcomer M.D.   On: 03/25/2021 07:47  MM LT PLC BREAST LOC DEV   1ST LESION  INC MAMMO GUIDE  Result Date: 03/15/2021 CLINICAL DATA:  Patient for preoperative localization prior to left breast lumpectomy. EXAM: NEEDLE LOCALIZATION OF THE LEFT BREAST WITH MAMMO GUIDANCE COMPARISON:  Previous exams. PROCEDURE: Patient presents for needle localization prior to left lumpectomy. I met with the patient and we discussed the procedure of needle localization including benefits and alternatives. We discussed the high likelihood of a successful procedure. We discussed the risks of the procedure, including infection,  bleeding, tissue injury, and further surgery. Informed, written consent was given. The usual time-out protocol was performed immediately prior to the procedure. Using mammographic guidance, sterile technique, 1% lidocaine and a 7 cm modified Kopans needle, posterior biopsy marking clip and the medial extent of the calcifications were localized using a lateral approach. Using mammographic guidance, sterile technique, 1% lidocaine and a 7 cm modified Kopans needle, biopsy marking clip within the anterolateral left breast was localized using a lateral approach. The images were marked for Dr.  Byrnett. IMPRESSION: Needle localization left breast. No apparent complications. Electronically Signed   By: Lovey Newcomer M.D.   On: 03/15/2021 11:18  MM LT PLC BREAST LOC DEV   EA ADD LESION  INC MAMMO GUIDE  Result Date: 03/15/2021 CLINICAL DATA:  Patient for preoperative localization prior to left breast lumpectomy. EXAM: NEEDLE LOCALIZATION OF THE LEFT BREAST WITH MAMMO GUIDANCE COMPARISON:  Previous exams. PROCEDURE: Patient presents for needle localization prior to left lumpectomy. I met with the patient and we discussed the procedure of needle localization including benefits and alternatives. We discussed the high likelihood of a successful procedure. We discussed the risks of the procedure, including infection, bleeding, tissue injury, and further surgery. Informed, written consent was given. The usual time-out protocol was performed immediately prior to the procedure. Using mammographic guidance, sterile technique, 1% lidocaine and a 7 cm modified Kopans needle, posterior biopsy marking clip and the medial extent of the calcifications were localized using a lateral approach. Using mammographic guidance, sterile technique, 1% lidocaine and a 7 cm modified Kopans needle, biopsy marking clip within the anterolateral left breast was localized using a lateral approach. The images were marked for Dr. Bary Castilla. IMPRESSION:  Needle localization left breast. No apparent complications. Electronically Signed   By: Lovey Newcomer M.D.   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  PLAN:    1.  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  Case discussed with surgery and cardiology.  Patient also had consultation with plastic surgery.  It was decided that it was too dangerous to undergo reconstruction and patient instead underwent lobectomy with axillary node dissection on March 15, 2021.   Continue neoadjuvant letrozole.  Have recommended chemotherapy likely using Taxotere plus carboplatinum or Taxotere plus Cytoxan.  Patient is HER2 positive and recommendations are to use Herceptin and Perjeta.  Will further discuss case with cardio oncologist at [facility] to discuss risks and benefits of treatment.  Will also get CT scan of chest, abdomen, and pelvis to complete the staging work-up.  Patient will have video assisted telemedicine visit in approximately 2 to 3 weeks to discuss the results and additional treatment planning.   2.  Cardiomyopathy: Patient's most recent cardiac echo on February 12, 2021 reported ejection fraction of 30%.  Appreciate cardiology input.   I spent a total of 30 minutes reviewing chart data, face-to-face evaluation with the patient, counseling and coordination of care as detailed above.   Patient expressed understanding and was in agreement with this plan. She also understands that She can call clinic at any time with any questions, concerns, or complaints.   Cancer Staging Carcinoma of breast, female, left [facility]) Staging form: Breast, AJCC 8th Edition - Pathologic stage from 03/25/2021: Stage IIIA (pT2, pN3a, cM0, G2, ER+, PR+, HER2+) - Signed by Lloyd Huger, MD on 03/25/2021 Histologic grading  system: 3 grade system   Lloyd Huger, MD   03/28/2021 7:19 AM

## 2021-03-27 ENCOUNTER — Telehealth: Payer: Self-pay

## 2021-03-27 ENCOUNTER — Encounter: Payer: Self-pay | Admitting: Oncology

## 2021-03-27 ENCOUNTER — Inpatient Hospital Stay (HOSPITAL_BASED_OUTPATIENT_CLINIC_OR_DEPARTMENT_OTHER): Payer: 59 | Admitting: Oncology

## 2021-03-27 ENCOUNTER — Other Ambulatory Visit: Payer: Self-pay

## 2021-03-27 VITALS — BP 108/74 | HR 68 | Resp 16 | Wt 201.2 lb

## 2021-03-27 DIAGNOSIS — C50412 Malignant neoplasm of upper-outer quadrant of left female breast: Secondary | ICD-10-CM | POA: Diagnosis present

## 2021-03-27 DIAGNOSIS — C50912 Malignant neoplasm of unspecified site of left female breast: Secondary | ICD-10-CM | POA: Diagnosis not present

## 2021-03-27 DIAGNOSIS — Z170 Estrogen receptor positive status [ER+]: Secondary | ICD-10-CM | POA: Diagnosis not present

## 2021-03-27 DIAGNOSIS — Z79811 Long term (current) use of aromatase inhibitors: Secondary | ICD-10-CM | POA: Diagnosis not present

## 2021-03-27 NOTE — Progress Notes (Signed)
Patient denies new problems/concerns today.   °

## 2021-03-27 NOTE — Telephone Encounter (Signed)
Patient needs to be seen by Adan Sis, MD at [facility] for cardio oncology evaluation prior to initiating chemotherapy for breast cancer due to her extensive cardiac history.    Dr. Annabell Sabal will be out of the office until 04/17/21, patient tentatively scheduled on 04/17/21.  [facility] scheduling has tentatively scheduled patient on 04/17/21 and will send an email to Dr. Annabell Sabal to review chart.  Also Dr. Elvia Collum contact number [phone]) given to Dr. Grayland Ormond to call and discuss this case.    Records faxed to [phone] att [place].

## 2021-03-28 ENCOUNTER — Telehealth: Payer: Self-pay | Admitting: Oncology

## 2021-04-01 LAB — SURGICAL PATHOLOGY

## 2021-04-09 ENCOUNTER — Encounter
Admission: RE | Disposition: A | Discharge status: Home / Self Care | Payer: Self-pay | Source: Ambulatory Visit | Attending: General Surgery

## 2021-04-09 ENCOUNTER — Encounter: Payer: Self-pay | Admitting: General Surgery

## 2021-04-09 ENCOUNTER — Ambulatory Visit: Payer: 59 | Admitting: Certified Registered"

## 2021-04-09 ENCOUNTER — Other Ambulatory Visit: Payer: Self-pay | Admitting: General Surgery

## 2021-04-09 ENCOUNTER — Ambulatory Visit
Admission: RE | Admit: 2021-04-09 | Discharge: 2021-04-09 | Disposition: A | Discharge status: Home / Self Care | Payer: 59 | Source: Ambulatory Visit | Attending: General Surgery | Admitting: General Surgery

## 2021-04-09 ENCOUNTER — Other Ambulatory Visit: Payer: Self-pay

## 2021-04-09 DIAGNOSIS — Z888 Allergy status to other drugs, medicaments and biological substances status: Secondary | ICD-10-CM | POA: Insufficient documentation

## 2021-04-09 DIAGNOSIS — Z8249 Family history of ischemic heart disease and other diseases of the circulatory system: Secondary | ICD-10-CM | POA: Diagnosis not present

## 2021-04-09 DIAGNOSIS — Z87891 Personal history of nicotine dependence: Secondary | ICD-10-CM | POA: Insufficient documentation

## 2021-04-09 DIAGNOSIS — Z9012 Acquired absence of left breast and nipple: Secondary | ICD-10-CM | POA: Diagnosis not present

## 2021-04-09 DIAGNOSIS — Z955 Presence of coronary angioplasty implant and graft: Secondary | ICD-10-CM | POA: Diagnosis not present

## 2021-04-09 DIAGNOSIS — Z885 Allergy status to narcotic agent status: Secondary | ICD-10-CM | POA: Insufficient documentation

## 2021-04-09 DIAGNOSIS — Z6835 Body mass index (BMI) 35.0-35.9, adult: Secondary | ICD-10-CM | POA: Diagnosis not present

## 2021-04-09 DIAGNOSIS — I429 Cardiomyopathy, unspecified: Secondary | ICD-10-CM | POA: Diagnosis not present

## 2021-04-09 DIAGNOSIS — Z79899 Other long term (current) drug therapy: Secondary | ICD-10-CM | POA: Diagnosis not present

## 2021-04-09 DIAGNOSIS — Z809 Family history of malignant neoplasm, unspecified: Secondary | ICD-10-CM | POA: Diagnosis not present

## 2021-04-09 DIAGNOSIS — Z7982 Long term (current) use of aspirin: Secondary | ICD-10-CM | POA: Insufficient documentation

## 2021-04-09 DIAGNOSIS — N611 Abscess of the breast and nipple: Secondary | ICD-10-CM | POA: Diagnosis not present

## 2021-04-09 DIAGNOSIS — Z95810 Presence of automatic (implantable) cardiac defibrillator: Secondary | ICD-10-CM | POA: Diagnosis not present

## 2021-04-09 DIAGNOSIS — J449 Chronic obstructive pulmonary disease, unspecified: Secondary | ICD-10-CM | POA: Diagnosis not present

## 2021-04-09 DIAGNOSIS — E669 Obesity, unspecified: Secondary | ICD-10-CM | POA: Diagnosis not present

## 2021-04-09 DIAGNOSIS — I2510 Atherosclerotic heart disease of native coronary artery without angina pectoris: Secondary | ICD-10-CM | POA: Diagnosis not present

## 2021-04-09 DIAGNOSIS — Z91040 Latex allergy status: Secondary | ICD-10-CM | POA: Diagnosis not present

## 2021-04-09 DIAGNOSIS — Z803 Family history of malignant neoplasm of breast: Secondary | ICD-10-CM | POA: Insufficient documentation

## 2021-04-09 DIAGNOSIS — I252 Old myocardial infarction: Secondary | ICD-10-CM | POA: Insufficient documentation

## 2021-04-09 DIAGNOSIS — Z9049 Acquired absence of other specified parts of digestive tract: Secondary | ICD-10-CM | POA: Diagnosis not present

## 2021-04-09 HISTORY — PX: INCISION AND DRAINAGE ABSCESS: SHX5864

## 2021-04-09 LAB — BASIC METABOLIC PANEL
Anion gap: 10 (ref 5–15)
BUN: 22 mg/dL — ABNORMAL HIGH (ref 6–20)
CO2: 23 mmol/L (ref 22–32)
Calcium: 9.6 mg/dL (ref 8.9–10.3)
Chloride: 102 mmol/L (ref 98–111)
Creatinine, Ser: 0.93 mg/dL (ref 0.44–1.00)
GFR, Estimated: >60 mL/min (ref >60)
Glucose, Bld: 130 mg/dL — ABNORMAL HIGH (ref 70–99)
Potassium: 4.5 mmol/L (ref 3.5–5.1)
Sodium: 135 mmol/L (ref 135–145)

## 2021-04-09 LAB — CBC WITH DIFFERENTIAL/PLATELET
Abs Immature Granulocytes: 0.37 10*3/uL — ABNORMAL HIGH (ref 0.00–0.07)
Basophils Absolute: 0.1 10*3/uL (ref 0.0–0.1)
Basophils Relative: 1 %
Eosinophils Absolute: 0.3 10*3/uL (ref 0.0–0.5)
Eosinophils Relative: 2 %
HCT: 40.6 % (ref 36.0–46.0)
Hemoglobin: 13.9 g/dL (ref 12.0–15.0)
Immature Granulocytes: 3 %
Lymphocytes Relative: 26 %
Lymphs Abs: 3.6 10*3/uL (ref 0.7–4.0)
MCH: 32.8 pg (ref 26.0–34.0)
MCHC: 34.2 g/dL (ref 30.0–36.0)
MCV: 95.8 fL (ref 80.0–100.0)
Monocytes Absolute: 1.4 10*3/uL — ABNORMAL HIGH (ref 0.1–1.0)
Monocytes Relative: 10 %
Neutro Abs: 7.9 10*3/uL — ABNORMAL HIGH (ref 1.7–7.7)
Neutrophils Relative %: 58 %
Platelets: 560 10*3/uL — ABNORMAL HIGH (ref 150–400)
RBC: 4.24 MIL/uL (ref 3.87–5.11)
RDW: 12.2 % (ref 11.5–15.5)
WBC: 13.7 10*3/uL — ABNORMAL HIGH (ref 4.0–10.5)
nRBC: 0 % (ref 0.0–0.2)

## 2021-04-09 LAB — GLUCOSE, CAPILLARY: Glucose-Capillary: 142 mg/dL — ABNORMAL HIGH (ref 70–99)

## 2021-04-09 SURGERY — INCISION AND DRAINAGE, ABSCESS
Anesthesia: General | Laterality: Left

## 2021-04-09 MED ORDER — MIDAZOLAM HCL 2 MG/2ML IJ SOLN
INTRAMUSCULAR | Status: AC
  Filled 2021-04-09: qty 2

## 2021-04-09 MED ORDER — FENTANYL CITRATE (PF) 100 MCG/2ML IJ SOLN
INTRAMUSCULAR | Status: DC | PRN
  Administered 2021-04-09: 25 ug via INTRAVENOUS
  Administered 2021-04-09: 50 ug via INTRAVENOUS
  Administered 2021-04-09: 25 ug via INTRAVENOUS

## 2021-04-09 MED ORDER — PHENYLEPHRINE HCL (PRESSORS) 10 MG/ML IV SOLN
INTRAVENOUS | Status: DC | PRN
  Administered 2021-04-09 (×3): 100 ug via INTRAVENOUS

## 2021-04-09 MED ORDER — FENTANYL CITRATE (PF) 100 MCG/2ML IJ SOLN
25.0000 ug | INTRAMUSCULAR | Status: DC | PRN
  Administered 2021-04-09 (×4): 25 ug via INTRAVENOUS

## 2021-04-09 MED ORDER — CHLORHEXIDINE GLUCONATE 0.12 % MT SOLN: 15.0000 mL | Freq: Once | OROMUCOSAL | Status: AC

## 2021-04-09 MED ORDER — 0.9 % SODIUM CHLORIDE (POUR BTL) OPTIME
TOPICAL | Status: DC | PRN
  Administered 2021-04-09: 1500 mL

## 2021-04-09 MED ORDER — PROPOFOL 10 MG/ML IV BOLUS
INTRAVENOUS | Status: AC
  Filled 2021-04-09: qty 20

## 2021-04-09 MED ORDER — ACETAMINOPHEN 10 MG/ML IV SOLN: 1000.0000 mg | Freq: Once | INTRAVENOUS | Status: DC | PRN

## 2021-04-09 MED ORDER — PROPOFOL 10 MG/ML IV BOLUS
INTRAVENOUS | Status: DC | PRN
  Administered 2021-04-09: 80 mg via INTRAVENOUS

## 2021-04-09 MED ORDER — LACTATED RINGERS IV SOLN: INTRAVENOUS | Status: DC

## 2021-04-09 MED ORDER — EPHEDRINE SULFATE 50 MG/ML IJ SOLN
INTRAMUSCULAR | Status: DC | PRN
  Administered 2021-04-09: 10 mg via INTRAVENOUS

## 2021-04-09 MED ORDER — ONDANSETRON HCL 4 MG/2ML IJ SOLN
INTRAMUSCULAR | Status: DC | PRN
  Administered 2021-04-09: 4 mg via INTRAVENOUS

## 2021-04-09 MED ORDER — CEFAZOLIN SODIUM-DEXTROSE 2-4 GM/100ML-% IV SOLN
INTRAVENOUS | Status: AC
  Filled 2021-04-09: qty 100

## 2021-04-09 MED ORDER — CHLORHEXIDINE GLUCONATE CLOTH 2 % EX PADS: 6.0000 | MEDICATED_PAD | Freq: Once | CUTANEOUS | Status: DC

## 2021-04-09 MED ORDER — ONDANSETRON HCL 4 MG/2ML IJ SOLN
INTRAMUSCULAR | Status: AC
  Filled 2021-04-09: qty 2

## 2021-04-09 MED ORDER — LIDOCAINE HCL (CARDIAC) PF 100 MG/5ML IV SOSY
PREFILLED_SYRINGE | INTRAVENOUS | Status: DC | PRN
Start: 2021-04-09 — End: 2021-04-09
  Administered 2021-04-09: 80 mg via INTRAVENOUS

## 2021-04-09 MED ORDER — LIDOCAINE HCL (PF) 2 % IJ SOLN
INTRAMUSCULAR | Status: AC
  Filled 2021-04-09: qty 5

## 2021-04-09 MED ORDER — CHLORHEXIDINE GLUCONATE 0.12 % MT SOLN
OROMUCOSAL | Status: AC
  Administered 2021-04-09: 15 mL via OROMUCOSAL
  Filled 2021-04-09: qty 15

## 2021-04-09 MED ORDER — SUCCINYLCHOLINE CHLORIDE 200 MG/10ML IV SOSY
PREFILLED_SYRINGE | INTRAVENOUS | Status: DC | PRN
  Administered 2021-04-09: 90 mg via INTRAVENOUS

## 2021-04-09 MED ORDER — MIDAZOLAM HCL 2 MG/2ML IJ SOLN
INTRAMUSCULAR | Status: DC | PRN
Start: 2021-04-09 — End: 2021-04-09
  Administered 2021-04-09: 2 mg via INTRAVENOUS

## 2021-04-09 MED ORDER — FENTANYL CITRATE (PF) 100 MCG/2ML IJ SOLN
INTRAMUSCULAR | Status: AC
  Filled 2021-04-09: qty 2

## 2021-04-09 MED ORDER — BUPIVACAINE-EPINEPHRINE (PF) 0.5% -1:200000 IJ SOLN
INTRAMUSCULAR | Status: AC
  Filled 2021-04-09: qty 30

## 2021-04-09 MED ORDER — DEXAMETHASONE SODIUM PHOSPHATE 10 MG/ML IJ SOLN
INTRAMUSCULAR | Status: DC | PRN
  Administered 2021-04-09: 10 mg via INTRAVENOUS

## 2021-04-09 MED ORDER — CEFAZOLIN SODIUM-DEXTROSE 2-4 GM/100ML-% IV SOLN
2.0000 g | INTRAVENOUS | Status: AC
  Administered 2021-04-09: 2 g via INTRAVENOUS

## 2021-04-09 MED ORDER — ORAL CARE MOUTH RINSE: 15.0000 mL | Freq: Once | OROMUCOSAL | Status: AC

## 2021-04-09 SURGICAL SUPPLY — 28 items
BINDER BREAST XXLRG (GAUZE/BANDAGES/DRESSINGS) ×2 IMPLANT
BLADE SURG 15 STRL SS SAFETY (BLADE) ×4 IMPLANT
CANISTER SUCT 1200ML W/VALVE (MISCELLANEOUS) ×2 IMPLANT
DRAIN PENROSE 0.625X18 (DRAIN) ×2 IMPLANT
DRAPE LAPAROTOMY 100X77 ABD (DRAPES) ×2 IMPLANT
DRSG GAUZE FLUFF 36X18 (GAUZE/BANDAGES/DRESSINGS) ×2 IMPLANT
ELECT REM PT RETURN 9FT ADLT (ELECTROSURGICAL) ×2
ELECTRODE REM PT RTRN 9FT ADLT (ELECTROSURGICAL) ×1 IMPLANT
GAUZE 4X4 16PLY ~~LOC~~+RFID DBL (SPONGE) ×2 IMPLANT
GAUZE SPONGE 4X4 12PLY STRL (GAUZE/BANDAGES/DRESSINGS) ×2 IMPLANT
GOWN STRL REUS W/ TWL LRG LVL3 (GOWN DISPOSABLE) ×2 IMPLANT
GOWN STRL REUS W/TWL LRG LVL3 (GOWN DISPOSABLE) ×4
KIT TURNOVER KIT A (KITS) ×2 IMPLANT
LABEL OR SOLS (LABEL) ×2 IMPLANT
MANIFOLD NEPTUNE II (INSTRUMENTS) ×2 IMPLANT
NEEDLE HYPO 25X1 1.5 SAFETY (NEEDLE) ×2 IMPLANT
PACK BASIN MINOR ARMC (MISCELLANEOUS) ×2 IMPLANT
PAD ABD DERMACEA PRESS 5X9 (GAUZE/BANDAGES/DRESSINGS) ×4 IMPLANT
SOL PREP POV-IOD 4OZ 10% (MISCELLANEOUS) ×2 IMPLANT
STAPLER SKIN PROX 35W (STAPLE) ×2 IMPLANT
STRIP CLOSURE SKIN 1/2X4 (GAUZE/BANDAGES/DRESSINGS) ×2 IMPLANT
SUT ETHILON 3-0 FS-10 30 BLK (SUTURE) ×2
SUT MON AB 2-0 CT1 36 (SUTURE) ×4 IMPLANT
SUT VIC AB 3-0 SH 27 (SUTURE) ×4
SUT VIC AB 3-0 SH 27X BRD (SUTURE) ×2 IMPLANT
SUTURE EHLN 3-0 FS-10 30 BLK (SUTURE) ×1 IMPLANT
SYR 10ML LL (SYRINGE) ×2 IMPLANT
SYR BULB IRRIG 60ML STRL (SYRINGE) ×2 IMPLANT

## 2021-04-09 NOTE — Anesthesia Preprocedure Evaluation (Addendum)
Anesthesia Evaluation  Patient identified by MRN, date of birth, ID band Patient awake    Reviewed: Allergy & Precautions, NPO status , Patient's Chart, lab work & pertinent test results, reviewed documented beta blocker date and time   History of Anesthesia Complications (+) Family history of anesthesia reaction and history of anesthetic complications  Airway Mallampati: III  TM Distance: <3 FB Neck ROM: full    Dental  (+) Teeth Intact   Pulmonary COPD, former smoker,    Pulmonary exam normal  + wheezing      Cardiovascular Exercise Tolerance: Poor hypertension, (-) angina+ CAD, + Past MI, + Cardiac Stents, +CHF and + DOE  Normal cardiovascular exam+ pacemaker + Cardiac Defibrillator   MPS 01/2021:  Blood pressure demonstrated a normal response to exercise.  There was no ST segment deviation noted during stress.  Defect 1: There is a large defect of severe severity present in the mid anterior, mid anterolateral and apical anterior location.  Findings consistent with prior myocardial infarction.  This is an intermediate risk study.  The left ventricular ejection fraction is severely decreased (<30%).   EKG 7/22: Atrial-paced rhythm Possible Anterolateral infarct (cited on or before 24-Feb-2016) Abnormal ECG When compared with ECG of 29-Oct-2018 10:14, Electronic atrial pacemaker has replaced Sinus rhythm Vent. rate has decreased BY 32 BPM QRS duration has decreased ST no longer depressed in Anterior leads T wave inversion less evident in Lateral leads Confirmed by Neoma Laming [phone]) on 7/18/   Neuro/Psych negative neurological ROS  negative psych ROS   GI/Hepatic Neg liver ROS, GERD  Controlled,  Endo/Other  diabetes, Poorly Controlled, Type 2A1c 6.6 01/2021  Renal/GU      Musculoskeletal   Abdominal (+) + obese,   Peds  Hematology negative hematology ROS (+)   Anesthesia Other Findings Past Medical  History: 10/22/2018: AICD (automatic cardioverter/defibrillator) present No date: CAD (coronary artery disease) No date: CHF (congestive heart failure) (HCC) No date: COPD (chronic obstructive pulmonary disease) ([place]) No date: DOE (dyspnea on exertion) No date: Family history of adverse reaction to anesthesia     Comment:  (+) 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 No date: GERD (gastroesophageal reflux disease) 04/23/2020: History of 2019 novel coronavirus disease (COVID-19) No date: HLD (hyperlipidemia) No date: HTN (hypertension) 01/23/2021: Invasive carcinoma of breast ([place])     Comment:  LEFT invasive mammary carcinoma; ER/PR (+), HER2/neu (+) No date: Ischemic cardiomyopathy     Comment:  s/p AICD placement 02/24/2016: ST elevation myocardial infarction (STEMI) of anterior  wall, initial episode of care [facility])     Comment:  DES x 1 to pLAD No date: Statin intolerance No date: T2DM (type 2 diabetes mellitus) ([place]) No date: Tobacco abuse     Comment:  quit smoking 2017  Past Surgical History: 12/10/2016: BREAST BIOPSY; Right     Comment:  neg bx FIBROADENOMATOUS CHANGES WITH STROMAL AND LUMINAL              CALCIFICATIONS. NEGATIVE 03/15/2021: BREAST LUMPECTOMY; Left     Comment:  lumpectomy with NL bracketing 02/24/2016: CARDIAC CATHETERIZATION; N/A     Comment:  Procedure: Left Heart Cath and Coronary Angiography;                Surgeon: Troy Sine, MD;  Location: [place] [facility]               [facility];  Service: Cardiovascular;  Laterality: N/A; 02/24/2016: CARDIAC CATHETERIZATION; N/A     Comment:  Procedure: Coronary  Stent Intervention (3.2518 mm               Xience Alpine DES stent);  Surgeon: Troy Sine, MD;                Location: [place] [facility];  Service: Cardiovascular 02/02/2019: CHOLECYSTECTOMY; N/A     Comment:  Procedure: LAPAROSCOPIC CHOLECYSTECTOMY - LATEX ALLERGY;              Surgeon: Olean Ree, MD;  Location: [facility];                Service:  General;  Laterality: N/A; 10/22/2018: IMPLANTABLE CARDIOVERTER DEFIBRILLATOR (ICD) GENERATOR  CHANGE; Left     Comment:  Procedure: ICD GENERATOR INSERTION;  Surgeon: Marzetta Board, MD;  Location: [facility] OR[facility];  Service:               Cardiovascular;  Laterality: Left;      Reproductive/Obstetrics negative OB ROS                            Anesthesia Physical  Anesthesia Plan  ASA: 3  Anesthesia Plan: General   Post-op Pain Management:    Induction: Intravenous  PONV Risk Score and Plan: Ondansetron, Dexamethasone, Midazolam and Treatment may vary due to age or medical condition  Airway Management Planned: LMA  Additional Equipment:   Intra-op Plan:   Post-operative Plan: Extubation in OR  Informed Consent: I have reviewed the patients History and Physical, chart, labs and discussed the procedure including the risks, benefits and alternatives for the proposed anesthesia with the patient or authorized representative who has indicated his/her understanding and acceptance.     Dental Advisory Given  Plan Discussed with: Anesthesiologist, CRNA and Surgeon  Anesthesia Plan Comments: (Patient is not pacemaker dependent so not reprogramming pacemaker.  Patient consented for risks of anesthesia including but not limited to:  - adverse reactions to medications - damage to eyes, teeth, lips or other oral mucosa - nerve damage due to positioning  - sore throat or hoarseness - Damage to heart, brain, nerves, lungs, other parts of body or loss of life  Patient voiced understanding.)       Anesthesia Quick Evaluation

## 2021-04-09 NOTE — Op Note (Addendum)
Preoperative diagnosis: Abscess at site of recent breast cancer resection.  Postoperative diagnosis: Same.  Operative procedure: Incision and drainage of left breast abscess, partial closure.  Operating Surgeon: Hervey Ard, MD.  Anesthesia: General endotracheal.  Estimated blood loss: Less than 30 cc.  Clinical note: The patient is 3 weeks post wide excision and axillary dissection for a large invasive lobular carcinoma of the left breast.  She had been doing well and last week ultrasound failed to show evidence of any fluid collections.  She reported 6 days ago spontaneous drainage which has continued until today's follow-up.  She had denied any fever or chills.  Examination the wound showed several areas of separation and probing with a Q-tip showed a deep cavity extending at least 5 cm into the central portion of the breast.  She is brought to the operating for incision and drainage.  She received Ancef on the induction of anesthesia after culture of the wound was obtained.  It was elected not to make use of cautery during the procedure because of her ICD device.  Operative note: Patient underwent general anesthesia and tolerated this well.  The breast was then cleansed with Betadine solution and draped.  The incision was opened throughout its length and superficially most areas were healing well.  Previously placed Vicryl sutures were removed.  Finger dissection showed no undrained pockets.  The breast was copiously irrigated with water.  Of note, there was no odor to any of the drainage.  A few areas suggestive of fat necrosis were sharply debrided with scissors.  A 1 inch Penrose drain was placed in the wound and anchored into the most lateral aspect of the incision with a 3-0 nylon suture.  The deep breast parenchyma was then easily reapproximated with interrupted 2-0 Monocryl figure-of-eight sutures.  The skin was closed loosely with staples.  The defect measured 15 cm in length. Fluff  gauze, ABD pads and a compressive wrap were applied.  The patient tolerated the procedure well without any cardiac arrhythmias.

## 2021-04-09 NOTE — Anesthesia Postprocedure Evaluation (Signed)
Anesthesia Post Note  Patient: Erika Rios  Procedure(s) Performed: INCISION AND DRAINAGE ABSCESS (Left)  Patient location during evaluation: PACU Anesthesia Type: General Level of consciousness: awake and alert Pain management: pain level controlled Vital Signs Assessment: post-procedure vital signs reviewed and stable Respiratory status: spontaneous breathing, nonlabored ventilation and respiratory function stable Cardiovascular status: blood pressure returned to baseline and stable Postop Assessment: no apparent nausea or vomiting Anesthetic complications: no   No notable events documented.   Last Vitals:  Vitals:   04/09/21 1800 04/09/21 1810  BP: 117/80 117/80  Pulse: 67 62  Resp: 16 18  Temp:  36.4 C  SpO2: 96% 94%    Last Pain:  Vitals:   04/09/21 1810  TempSrc:   PainSc: [place]

## 2021-04-09 NOTE — Transfer of Care (Signed)
Immediate Anesthesia Transfer of Care Note  Patient: Erika Rios  Procedure(s) Performed: INCISION AND DRAINAGE ABSCESS (Left)  Patient Location: PACU  Anesthesia Type:General  Level of Consciousness: drowsy  Airway & Oxygen Therapy: Patient Spontanous Breathing and Patient connected to face mask oxygen  Post-op Assessment: Report given to RN and Post -op Vital signs reviewed and stable  Post vital signs: Reviewed and stable  Last Vitals:  Vitals Value Taken Time  BP 104/65 04/09/21 1719  Temp    Pulse 66 04/09/21 1723  Resp 17 04/09/21 1723  SpO2 100 % 04/09/21 1723  Vitals shown include unvalidated device data.  Last Pain:  Vitals:   04/09/21 1502  TempSrc: Temporal  PainSc: 8          Complications: No notable events documented.

## 2021-04-09 NOTE — Progress Notes (Signed)
Subjective:     Patient ID: Erika Rios is a 60 y.o. female.   HPI   The following portions of the patient's history were reviewed and updated as appropriate.   This an established patient is here today for: office visit. The patient is here today to follow up from a left lumpectomy done on 03-15-21. Patient called the office last week to report drainage that started on 04-03-21.  The day prior office ultrasound had failed to show any significant fluid collection. The patient has been using a wash cloth in her bra and reports changing it once per day. Patient reports that two days after her last office visit she started an antibiotic that she had already at home. She states she was concerned there was an infection. Patient states she has been taking amoxicillin 875 mg one pill twice per day. Also, reports she has been having more pain and she used tramadol with some relief. She has been taking this 2-3 times per day. Patient's husband reports she does not have much appetite over the past weekend, but better yesterday.    She denies fever.  Reports some "hot flashes".        She is here with her husband, Erika Rios.      Chief Complaint  Patient presents with   Post Operative Visit      BP 104/70   Pulse 83   Temp 36.3 C (97.3 F)   Ht 160 cm ('5\' 3"' )   Wt 91.6 kg (202 lb)   LMP  (LMP Unknown)   SpO2 97%   BMI 35.78 kg/m        Past Medical History:  Diagnosis Date   Breast cancer metastasized to axillary lymph node, left (CMS-HCC) 01/23/2021    Left upper outer quadrant and retroareolar area, positive axillary node on biopsy.   Cardiomyopathy, secondary (CMS-HCC)     COPD (chronic obstructive pulmonary disease) (CMS-HCC)     Coronary artery disease     GERD (gastroesophageal reflux disease)     Heart attack (CMS-HCC) 2017   History of tobacco abuse     Obesity             Past Surgical History:  Procedure Laterality Date   cardiac catherization   02/24/2016    CHOLECYSTECTOMY   02/02/2019    Dr Hampton Abbot   heart stint       implantable cardioverter   10/22/2018    defibrillator   MASTECTOMY PARTIAL / LUMPECTOMY Left 03/15/2021   right breast biopsy   2018                OB History     Gravida  2   Para  2   Term      Preterm      AB      Living         SAB      IAB      Ectopic      Molar      Multiple      Live Births           Obstetric Comments  Age at first period 64 Age of first pregnancy 51             Social History           Socioeconomic History   Marital status: Married  Tobacco Use   Smoking status: Former Smoker      Packs/day: 0.50  Years: 34.00      Pack years: 17.00      Types: Cigarettes      Quit date: 03/19/2016      Years since quitting: 5.0   Smokeless tobacco: Never Used   Tobacco comment: pt has not smoked in 3.5 weeks, since her heart attack  Vaping Use   Vaping Use: Never used  Substance and Sexual Activity   Alcohol use: Yes      Comment: occasionally   Drug use: Never   Sexual activity: Not Currently             Allergies  Allergen Reactions   Statins-Hmg-Coa Reductase Inhibitors Unknown      Hip pain, constant   Latex Dermatitis   Morphine Rash   Iron Nausea And Vomiting   Oxycodone Anxiety and Other (See Comments)   Zinc Rash      Current Medications        Current Outpatient Medications  Medication Sig Dispense Refill   acetaminophen (TYLENOL) 500 MG tablet Take 1,000 mg by mouth as needed         albuterol 90 mcg/actuation inhaler Inhale 2 inhalations into the lungs every 6 (six) hours as needed for Wheezing 18 g 5   aspirin 325 MG EC tablet Take 325 mg by mouth once daily   0   carvediloL (COREG) 3.125 MG tablet TAKE 1 TABLET BY MOUTH TWICE A DAY WITH MEALS 60 tablet 11   cyanocobalamin, vitamin B-12, 5,000 mcg Cap Take 5,000 mcg by mouth once daily       HYDROcodone-acetaminophen (NORCO) 5-325 mg tablet Take by mouth as needed       letrozole  (FEMARA) 2.5 mg tablet Take 2.5 mg by mouth once daily       lisinopriL (ZESTRIL) 5 MG tablet Take 1 tablet (5 mg total) by mouth once daily 90 tablet 3   magnesium oxide (MAG-OX) 400 mg (241.3 mg magnesium) tablet Take by mouth once daily       nitroGLYcerin (NITROSTAT) 0.4 MG SL tablet PLACE 1 TABLET UNDER TONGUE EVERY 5 MINUTES IF NEEDED FOR CHEST PAIN UP TO 3 DOSES   12   ONETOUCH ULTRA BLUE TEST STRIP test strip USE 3 (THREE) TIMES DAILY BEFORE MEALS 300 strip 1   pantoprazole (PROTONIX) 40 MG DR tablet Take 1 tablet (40 mg total) by mouth once daily (Patient taking differently: Take 40 mg by mouth as needed) 90 tablet 3   potassium gluconate 2.5 mEq Tab Take 198 mg by mouth once daily       rosuvastatin (CRESTOR) 5 MG tablet Take 1 tab twice a week 30 tablet 3   spironolactone (ALDACTONE) 25 MG tablet TAKE 1/2 TABLET BY MOUTH ONCE DAILY 15 tablet 11   triamcinolone 0.1 % cream APPLY TOPICALLY 2 (TWO) TIMES DAILY APPLY TO RASH TWICE DAILY. (Patient taking differently: Apply topically as needed Apply to rash twice daily.) 30 g 3   blood glucose diagnostic, drum test strip Use 3 (three) times daily before meals for 180 days Use as instructed. (Patient not taking: Reported on 01/29/2021) 300 each 1   blood glucose meter kit Use 3 (three) times daily before meals (Patient not taking: Reported on 01/29/2021) 1 each 0   FUROsemide (LASIX) 20 MG tablet Take 1 tablet (20 mg total) by mouth once daily (Patient taking differently: Take 20 mg by mouth once daily as needed) 30 tablet 11   ibuprofen (MOTRIN) 200 MG tablet Take by mouth as  needed (Patient not taking: Reported on 04/09/2021)       lancets Use 1 each 3 (three) times daily before meals Use as instructed. (Patient not taking: Reported on 01/29/2021) 300 each 1    No current facility-administered medications for this visit.             Family History  Problem Relation Age of Onset   Cancer Mother     Heart disease Mother     Breast cancer  Mother 76   Myocardial Infarction (Heart attack) Mother     Diabetes Father     Colon cancer Neg Hx          Review of Systems  Constitutional: Negative for chills and fever.  Respiratory: Negative for cough.          Objective:   Physical Exam Exam conducted with a chaperone present.  Constitutional:      Appearance: Normal appearance.  Cardiovascular:     Rate and Rhythm: Normal rate and regular rhythm.     Pulses: Normal pulses.     Heart sounds: Normal heart sounds.  Chest:       Comments: The wide excision site has multiple openings draining odorless, thick, beige fluid. Some superficial skin blistering. Minimal erythema. No evidence of fluid in the axilla.  Musculoskeletal:     Cervical back: Normal range of motion.  Neurological:     Mental Status: She is alert and oriented to person, place, and time.  Psychiatric:        Mood and Affect: Mood normal.        Behavior: Behavior normal.           Assessment:     Possible breast abscess post wide excision.    Plan:     I recommended we proceed to exam under anesthesia for drainage, drain placement if indicated.     She reports she took her AM meds with a sip of water, no other food or drink.    Will proceed with I&D today.     This note is partially prepared by Ledell Noss, CMA acting as a scribe in the presence of Dr. Hervey Ard, MD.    The documentation recorded by the scribe accurately reflects the service I personally performed and the decisions made by me.    Robert Bellow, MD FACS

## 2021-04-09 NOTE — H&P (Signed)
See progress notes from this date.

## 2021-04-09 NOTE — Anesthesia Procedure Notes (Signed)
Procedure Name: Intubation Date/Time: 04/09/2021 4:45 PM Performed by: Tollie Eth, CRNA Pre-anesthesia Checklist: Patient identified, Patient being monitored, Timeout performed, Emergency Drugs available and Suction available Patient Re-evaluated:Patient Re-evaluated prior to induction Oxygen Delivery Method: Circle system utilized Preoxygenation: Pre-oxygenation with 100% oxygen Induction Type: IV induction Ventilation: Mask ventilation without difficulty Laryngoscope Size: Glidescope Grade View: Grade II Tube type: Oral Tube size: 7.0 mm Number of attempts: 1 Airway Equipment and Method: Stylet Placement Confirmation: ETT inserted through vocal cords under direct vision, positive ETCO2 and breath sounds checked- equal and bilateral Secured at: 22 cm Tube secured with: Tape Dental Injury: Teeth and Oropharynx as per pre-operative assessment

## 2021-04-09 NOTE — Discharge Instructions (Signed)

## 2021-04-09 NOTE — Progress Notes (Signed)
Joanie with [employer] was notified of patients surgery. Waiting to hear back from her to see what her plan is for this surgery.

## 2021-04-09 NOTE — Progress Notes (Signed)
Erika Rios with [employer] called and stated that because electrocautery isn't going to be used that the ICD does not need to be turned off. A magnet can be placed over the ICD if Anesthesia wants to for this surgery.

## 2021-04-10 ENCOUNTER — Encounter: Payer: Self-pay | Admitting: General Surgery

## 2021-04-13 LAB — AEROBIC/ANAEROBIC CULTURE W GRAM STAIN (SURGICAL/DEEP WOUND)

## 2021-04-18 ENCOUNTER — Other Ambulatory Visit: Payer: Self-pay

## 2021-04-18 ENCOUNTER — Ambulatory Visit
Admission: RE | Admit: 2021-04-18 | Discharge: 2021-04-18 | Disposition: A | Discharge status: Home / Self Care | Payer: 59 | Source: Ambulatory Visit | Attending: Oncology | Admitting: Oncology

## 2021-04-18 DIAGNOSIS — C50912 Malignant neoplasm of unspecified site of left female breast: Secondary | ICD-10-CM | POA: Diagnosis not present

## 2021-04-18 MED ORDER — IOHEXOL 350 MG/ML SOLN
100.0000 mL | Freq: Once | INTRAVENOUS | Status: AC | PRN
  Administered 2021-04-18: 100 mL via INTRAVENOUS

## 2021-04-19 ENCOUNTER — Telehealth: Payer: Self-pay | Admitting: *Deleted

## 2021-04-19 NOTE — Progress Notes (Signed)
[place]  [phone] [phone]) [phone]  ID: Erika Rios OB: 30-Jul-1961  MR#: [id number]  WEX#:[id number]  Patient Care Team: Tracie Harrier, MD as PCP - General (Internal Medicine) Rico Junker, RN as Oncology Nurse Navigator Rico Junker, RN as Oncology Nurse Navigator  I connected with Erika Rios on 04/25/21 at  3:45 PM EDT by video enabled telemedicine visit and verified that I am speaking with the correct person using two identifiers.   I discussed the limitations, risks, security and privacy concerns of performing an evaluation and management service by telemedicine and the availability of in-person appointments. I also discussed with the patient that there may be a patient responsible charge related to this service. The patient expressed understanding and agreed to proceed.   Other persons participating in the visit and their role in the encounter: Patient, MD.  Patient's location: Home. Provider's location: Clinic.  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  INTERVAL HISTORY: Patient agreed to video assisted telemedicine visit for further evaluation and treatment planning.  She recently had consultation at [facility].  She also had abscess draining at the site of her surgery, but states this is healing.  She continues to be anxious.  She is tolerating letrozole only occasional hot flashes that do not disrupt her day-to-day activity.  She has no neurologic complaints.  She denies any recent fevers or illnesses.  She has a good appetite and denies weight loss.  She has no chest pain, shortness of breath, cough, or hemoptysis.  She denies any nausea, vomiting, constipation, or diarrhea.  She has no urinary complaints.  Patient offers no further specific complaints today.  REVIEW OF SYSTEMS:   Review of Systems  Constitutional: Negative.  Negative for  fever, malaise/fatigue and weight loss.  Respiratory: Negative.  Negative for cough, hemoptysis and shortness of breath.   Cardiovascular: Negative.  Negative for chest pain and leg swelling.  Gastrointestinal: Negative.  Negative for abdominal pain.  Genitourinary: Negative.  Negative for frequency.  Musculoskeletal: Negative.  Negative for back pain.  Skin: Negative.  Negative for rash.  Neurological:  Positive for sensory change. Negative for dizziness, focal weakness, weakness and headaches.  Psychiatric/Behavioral:  The patient is nervous/anxious.    As per HPI. Otherwise, a complete review of systems is negative.  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   GERD (gastroesophageal reflux disease)    History of 2019 novel coronavirus disease (COVID-19) 04/23/2020   HLD (hyperlipidemia)    HTN (hypertension)    Invasive carcinoma of breast ([place]) 01/23/2021   LEFT invasive mammary carcinoma; ER/PR (+), HER2/neu (+)   Ischemic cardiomyopathy    s/p AICD placement   ST elevation myocardial infarction (STEMI) of anterior wall, initial episode of care ([place]) 02/24/2016   DES x 1 to pLAD   Statin intolerance    T2DM (type 2 diabetes mellitus) ([place])    Tobacco abuse    quit smoking 2017    PAST SURGICAL HISTORY: Past Surgical History:  Procedure Laterality Date   BREAST BIOPSY Right 12/10/2016   neg bx FIBROADENOMATOUS  CHANGES WITH STROMAL AND LUMINAL CALCIFICATIONS. NEGATIVE   BREAST LUMPECTOMY Left 03/15/2021   lumpectomy with NL bracketing   BREAST LUMPECTOMY WITH NEEDLE LOCALIZATION Left 03/15/2021   Procedure: BREAST LUMPECTOMY WITH NEEDLE  LOCALIZATION;  Surgeon: Robert Bellow, MD;  Location: [facility] OR[facility];  Service: General;  Laterality: Left;   CARDIAC CATHETERIZATION N/A 02/24/2016   Procedure: Left Heart Cath and Coronary Angiography;  Surgeon: Troy Sine, MD;  Location: [place] [facility];  Service: Cardiovascular;  Laterality: N/A;   CARDIAC CATHETERIZATION N/A 02/24/2016   Procedure: Coronary Stent Intervention (3.2518 mm Xience Alpine DES stent);  Surgeon: Troy Sine, MD;  Location: [place] [facility];  Service: Cardiovascular   CHOLECYSTECTOMY N/A 02/02/2019   Procedure: LAPAROSCOPIC CHOLECYSTECTOMY - LATEX ALLERGY;  Surgeon: Olean Ree, MD;  Location: [facility];  Service: General;  Laterality: N/A;   IMPLANTABLE CARDIOVERTER DEFIBRILLATOR (ICD) GENERATOR CHANGE Left 10/22/2018   Procedure: ICD GENERATOR INSERTION;  Surgeon: Marzetta Board, MD;  Location: [facility];  Service: Cardiovascular;  Laterality: Left;   INCISION AND DRAINAGE ABSCESS Left 04/09/2021   Procedure: INCISION AND DRAINAGE ABSCESS;  Surgeon: Robert Bellow, MD;  Location: [facility];  Service: General;  Laterality: Left;  breast   IR BILIARY DRAIN PLACEMENT WITH CHOLANGIOGRAM      FAMILY HISTORY: Family History  Problem Relation Age of Onset   Cancer Mother    Heart disease Mother    Heart attack Mother    Breast cancer Mother    Diabetes Father     ADVANCED DIRECTIVES (Y/N):  N  HEALTH MAINTENANCE: Social History   Tobacco Use   Smoking status: Former    Packs/day: 0.50    Years: 34.00    Pack years: 17.00    Types: Cigarettes    Quit date: 03/19/2016    Years since quitting: 5.1   Smokeless tobacco: Never  Vaping Use   Vaping Use: Never used  Substance Use Topics   Alcohol use: Yes    Alcohol/week: 0.0 standard drinks    Comment: occ   Drug use: No     Colonoscopy:  PAP:  Bone density:  Lipid panel:  Allergies  Allergen Reactions   Statins Rash and Other (See Comments)    Hip pain, constant    Elastic  Bandages & [Zinc] Rash    Paper tape should be okay   Entresto [Sacubitril-Valsartan] Palpitations   Iron Nausea And Vomiting   Latex Dermatitis   Nsaids     Told not to take d/t heart status   Oxycodone Anxiety    Current Outpatient Medications  Medication Sig Dispense Refill   acetaminophen (TYLENOL) 500 MG tablet Take 2 tablets (1,000 mg total) by mouth every 6 (six) hours as needed for mild pain, moderate pain or headache. 30 tablet 0   albuterol (VENTOLIN HFA) 108 (90 Base) MCG/ACT inhaler Inhale 1-2 puffs into the lungs every 6 (six) hours as needed for wheezing or shortness of breath.     aspirin 325 MG EC tablet Take by mouth.     carvedilol (COREG) 3.125 MG tablet Take 3.125 mg by mouth 2 (two) times daily with a meal.     ciprofloxacin (CIPRO) 500 MG tablet Take 500 mg by mouth 2 (two) times daily.     furosemide (LASIX) 20 MG tablet Take by mouth.     gabapentin (NEURONTIN) 300 MG capsule Take 1 capsule by mouth 3 (three) times daily.     ibuprofen (ADVIL) 200 MG  tablet Take 600 mg by mouth at bedtime as needed for headache or moderate pain.     letrozole (FEMARA) 2.5 MG tablet Take 1 tablet (2.5 mg total) by mouth daily. 90 tablet 3   lisinopril (ZESTRIL) 5 MG tablet Take 5 mg by mouth daily.     magnesium oxide (MAG-OX) 400 MG tablet Take 400 mg by mouth daily.     nitroGLYCERIN (NITROSTAT) 0.4 MG SL tablet PLACE 1 TABLET UNDER TONGUE EVERY 5 MINUTES IF NEEDED FOR CHEST PAIN UP TO 3 DOSES 25 tablet 5   Potassium 99 MG TABS Take 198 mg by mouth every evening.     rosuvastatin (CRESTOR) 5 MG tablet Take 5 mg by mouth 2 (two) times a week. Monday and Tuesday morning     spironolactone (ALDACTONE) 25 MG tablet Take 0.5 tablets (12.5 mg total) by mouth daily. (Patient taking differently: Take 12.5 mg by mouth every evening.) 30 tablet 6   triamcinolone cream (KENALOG) 0.1 % Apply 1 application topically daily as needed (rash).   3   vitamin B-12 (CYANOCOBALAMIN) 250 MCG tablet  Take 250 mcg by mouth daily.     HYDROcodone-acetaminophen (NORCO/VICODIN) 5-325 MG tablet Take 1 tablet by mouth every 4 (four) hours as needed for moderate pain. (Patient not taking: Reported on 04/25/2021) 15 tablet 0   lisinopril (PRINIVIL,ZESTRIL) 2.5 MG tablet Take 1 tablet (2.5 mg total) by mouth daily. PLEASE CONTACT OFFICE FOR ADDITIONAL REFILLS (Patient not taking: Reported on 04/25/2021) 30 tablet 0   No current facility-administered medications for this visit.    OBJECTIVE: There were no vitals filed for this visit.     There is no height or weight on file to calculate BMI.    ECOG FS:0 - Asymptomatic  General: Well-developed, well-nourished, no acute distress. HEENT: Normocephalic. Neuro: Alert, answering all questions appropriately. Cranial nerves grossly intact. Psych: Normal affect. But  LAB RESULTS:  Lab Results  Component Value Date   NA 135 04/09/2021   K 4.5 04/09/2021   CL 102 04/09/2021   CO2 23 04/09/2021   GLUCOSE 130 (H) 04/09/2021   BUN 22 (H) 04/09/2021   CREATININE 0.93 04/09/2021   CALCIUM 9.6 04/09/2021   PROT 6.8 02/02/2019   ALBUMIN 3.7 02/02/2019   AST 20 02/02/2019   ALT 25 02/02/2019   ALKPHOS 88 02/02/2019   BILITOT 0.5 02/02/2019   GFRNONAA >60 04/09/2021   GFRAA >60 02/02/2019    Lab Results  Component Value Date   WBC 13.7 (H) 04/09/2021   NEUTROABS 7.9 (H) 04/09/2021   HGB 13.9 04/09/2021   HCT 40.6 04/09/2021   MCV 95.8 04/09/2021   PLT 560 (H) 04/09/2021     STUDIES: CT CHEST ABDOMEN PELVIS W CONTRAST  Result Date: 04/18/2021 CLINICAL DATA:  Left breast cancer status post lumpectomy, initial staging. EXAM: CT CHEST, ABDOMEN, AND PELVIS WITH CONTRAST TECHNIQUE: Multidetector CT imaging of the chest, abdomen and pelvis was performed following the standard protocol during bolus administration of intravenous contrast. CONTRAST:  148m OMNIPAQUE IOHEXOL 350 MG/ML SOLN COMPARISON:  CT Jan 25, 2019. FINDINGS: CT CHEST FINDINGS  Cardiovascular: Left chest AICD/pacemaker with leads in the right atrium right ventricle. Aortic atherosclerosis without aneurysmal dilation. No central pulmonary embolus. Coronary artery calcifications. Hypoenhancement and myocardial thinning of the apical left ventricle extending to the apex, consistent with sequela of prior infarction. No suspicious intracardiac filling defect visualized. No significant pericardial effusion/thickening. Mediastinum/Nodes: No supraclavicular adenopathy. No discrete thyroid nodule. No pathologically enlarged mediastinal, hilar or  axillary lymph nodes. The trachea esophagus are unremarkable. Lungs/Pleura: No suspicious pulmonary nodules or masses. No airspace consolidation. No pleural effusion. No pneumothorax. Musculoskeletal: Postsurgical change in the left breast and axilla related to lumpectomy and axillary lymph node dissection and subsequent drainage of left breast abscess, there is soft tissue stranding and subcutaneous gas in the left breast without walled-off fluid collection. There are few soft tissue nodules in the axillary tail of the left breast measuring up to 4 mm on image 30/2 common nonspecific but possibly reflecting reactive lymph nodes. Curvilinear band of soft tissue in the left axilla adjacent to a metallic localization clip measuring approximately 2.3 x 0.9 cm on image 15/2 with a few prominent lymph nodes measuring up to 4 mm. CT ABDOMEN PELVIS FINDINGS Hepatobiliary: No suspicious hepatic lesion. Gallbladder surgically absent. No biliary ductal dilation. Pancreas: Within normal limits. Spleen: Within normal limits. Adrenals/Urinary Tract: Adrenal glands are unremarkable. Kidneys are normal, without renal calculi, solid enhancing lesion, or hydronephrosis. Bladder is unremarkable, for degree of distension. Stomach/Bowel: Radiopaque enteric contrast traverses the sigmoid colon. Stomach is grossly unremarkable for degree of distension. No pathologic dilation of  small or large bowel. The appendix and terminal ileum are unremarkable. Colonic diverticulosis without findings of acute diverticulitis. Vascular/Lymphatic: Aortic atherosclerosis without aneurysmal dilation. No pathologically enlarged abdominal or pelvic lymph nodes. Reproductive: Uterus and bilateral adnexa are unremarkable. Other: No abdominopelvic ascites. Musculoskeletal: Multilevel degenerative changes spine. No aggressive lytic or blastic lesion of bone. IMPRESSION: 1. Postsurgical change in the left breast and axilla related to lumpectomy with axillary lymph node dissection and subsequent surgical drainage of left breast abscess. There are few soft tissue nodules in the axillary tail of the left breast measuring up to 4 mm, nonspecific but possibly reflecting reactive lymph nodes related to recent infection. Attention on follow-up versus further evaluation with dedicated breast ultrasound is recommended. 2. Curvilinear band of soft tissue in the left axilla with some adjacent prominent lymph nodes measuring up to 4 mm, nonspecific and possibly reflecting postsurgical change with reactive lymph nodes related to recent infection. Attention on follow-up imaging versus further evaluation with dedicated breast ultrasound is suggested. 3. No evidence of distant metastatic disease within the chest, abdomen, or pelvis. 4. Colonic diverticulosis without findings of acute diverticulitis. 5. Hypoenhancement and myocardial thinning of the apical left ventricle extending through the apex, consistent with sequela of prior infarction. 6.  Aortic Atherosclerosis (ICD10-I70.0). Electronically Signed   By: Dahlia Bailiff M.D.   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  PLAN:    1.  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  Case discussed with surgery and cardiology.  Patient also had consultation with plastic surgery.  It was decided that it was too dangerous to undergo reconstruction and patient instead underwent lobectomy with axillary node dissection on March 15, 2021.   Continue neoadjuvant letrozole.  CT scan on April 18, 2021 did not reveal any metastatic disease.  Will get nuclear med bone scan to complete the staging work-up.  Case discussed with [facility] Annabell Sabal at [facility] and recommendation is to proceed with Taxotere, Cytoxan, and Herceptin and check BNP and cardiac enzymes with each cycle.  Will repeat echo prior to initiating treatment and then repeat echo after every 2 infusions.  Can consider adding Perjeta after several cycles  if cardiac status remains stable.  Patient will require port placement and further healing of her axillary abscess prior to initiating treatment.  Continue letrozole as prescribed.  Return to clinic in 1 to 2 weeks for further evaluation and possible initiation of treatment.    2.  Cardiomyopathy: Patient's most recent cardiac echo on February 12, 2021 reported ejection fraction of 30%.  Appreciate cardiology input.  I provided 30 minutes of face-to-face video visit time during this encounter which included chart review, counseling, and coordination of care as documented above.    Patient expressed understanding and was in agreement with this plan. She also understands that She can call clinic at any time with any questions, concerns, or complaints.   Cancer Staging Carcinoma of breast, female, left [facility]) Staging form: Breast, AJCC 8th Edition - Pathologic stage from 03/25/2021: Stage IIIA (pT2, pN3a, cM0, G2, ER+, PR+, HER2+) - Signed by Lloyd Huger, MD on 03/25/2021 Histologic grading system: 3 grade system   Lloyd Huger, MD   04/25/2021 7:02 PM

## 2021-04-19 NOTE — Telephone Encounter (Signed)
Doctor would like to discuss this mutual patient.

## 2021-04-23 ENCOUNTER — Telehealth: Payer: 59 | Admitting: Oncology

## 2021-04-25 ENCOUNTER — Encounter: Payer: Self-pay | Admitting: Oncology

## 2021-04-25 ENCOUNTER — Inpatient Hospital Stay: Payer: 59 | Attending: Oncology | Admitting: Oncology

## 2021-04-25 DIAGNOSIS — C50912 Malignant neoplasm of unspecified site of left female breast: Secondary | ICD-10-CM

## 2021-04-29 ENCOUNTER — Other Ambulatory Visit: Payer: Self-pay

## 2021-04-29 DIAGNOSIS — C50912 Malignant neoplasm of unspecified site of left female breast: Secondary | ICD-10-CM

## 2021-04-29 NOTE — Addendum Note (Signed)
Addended by: Vanice Sarah on: 04/29/2021 09:51 AM   Modules accepted: Orders

## 2021-04-29 NOTE — Addendum Note (Signed)
Addended by: Vanice Sarah on: 04/29/2021 10:11 AM   Modules accepted: Orders

## 2021-04-30 ENCOUNTER — Other Ambulatory Visit: Payer: Self-pay | Admitting: General Surgery

## 2021-05-01 ENCOUNTER — Encounter
Admission: RE | Admit: 2021-05-01 | Discharge: 2021-05-01 | Disposition: A | Discharge status: Home / Self Care | Payer: 59 | Source: Ambulatory Visit | Attending: Oncology | Admitting: Oncology

## 2021-05-01 DIAGNOSIS — C50912 Malignant neoplasm of unspecified site of left female breast: Secondary | ICD-10-CM | POA: Diagnosis present

## 2021-05-01 MED ORDER — TECHNETIUM TC 99M MEDRONATE IV KIT
20.0000 | PACK | Freq: Once | INTRAVENOUS | Status: AC | PRN
  Administered 2021-05-01: 22.89 via INTRAVENOUS

## 2021-05-02 NOTE — Progress Notes (Signed)
Subjective:     Patient ID: Erika Rios is a 60 y.o. female.   HPI   The following portions of the patient's history were reviewed and updated as appropriate.   This an established patient is here today for: office visit. The patient is here today to follow up from an incision and drainage of a left breast abscess done on 04-09-21. She is also status post left lumpectomy. Discharge on her bandage is minimal and states are is improving.    The patient is accompanied today by her husband, Erika Rios.         Chief Complaint  Patient presents with   Post Operative Visit      BP 122/80   Pulse 73   Temp (!) 35.8 C (96.4 F)   Ht 158.8 cm (5' 2.5")   Wt 94.3 kg (208 lb)   LMP  (LMP Unknown)   BMI 37.44 kg/m        Past Medical History:  Diagnosis Date   Breast cancer metastasized to axillary lymph node, left (CMS-HCC) 01/23/2021    Left upper outer quadrant and retroareolar area, 4 x 4.5 x 4.7 cm, 15 mm secondary tumor.  10/13 nodes positive.  Triple positive.   Cardiomyopathy, secondary (CMS-HCC)     COPD (chronic obstructive pulmonary disease) (CMS-HCC)     Coronary artery disease     GERD (gastroesophageal reflux disease)     Heart attack (CMS-HCC) 2017   History of tobacco abuse     Obesity             Past Surgical History:  Procedure Laterality Date   cardiac catherization   02/24/2016   CHOLECYSTECTOMY   02/02/2019    Dr Hampton Abbot   heart stint       implantable cardioverter   10/22/2018    defibrillator   INCISION AND DRAINAGE BREAST ABSCESS Left 04/09/2021   MASTECTOMY PARTIAL / LUMPECTOMY Left 03/15/2021   right breast biopsy   2018                OB History     Gravida  2   Para  2   Term      Preterm      AB      Living         SAB      IAB      Ectopic      Molar      Multiple      Live Births           Obstetric Comments  Age at first period 60 Age of first pregnancy 89             Social History            Socioeconomic History   Marital status: Married  Tobacco Use   Smoking status: Former Smoker      Packs/day: 0.50      Years: 34.00      Pack years: 17.00      Types: Cigarettes      Quit date: 03/19/2016      Years since quitting: 5.1   Smokeless tobacco: Never Used   Tobacco comment: pt has not smoked in 3.5 weeks, since her heart attack  Vaping Use   Vaping Use: Never used  Substance and Sexual Activity   Alcohol use: Yes      Comment: occasionally   Drug use: Never   Sexual activity: Not Currently  Allergies  Allergen Reactions   Statins-Hmg-Coa Reductase Inhibitors Unknown      Hip pain, constant   Latex Dermatitis   Morphine Rash   Iron Nausea And Vomiting   Oxycodone Anxiety and Other (See Comments)   Sacubitril-Valsartan Palpitations   Zinc Rash      Current Medications        Current Outpatient Medications  Medication Sig Dispense Refill   acetaminophen (TYLENOL) 500 MG tablet Take 1,000 mg by mouth as needed         albuterol 90 mcg/actuation inhaler Inhale 2 inhalations into the lungs every 6 (six) hours as needed for Wheezing 18 g 5   carvediloL (COREG) 3.125 MG tablet TAKE 1 TABLET BY MOUTH TWICE A DAY WITH MEALS 60 tablet 11   cyanocobalamin, vitamin B-12, 5,000 mcg Cap Take 5,000 mcg by mouth once daily       FUROsemide (LASIX) 20 MG tablet Take 20 mg by mouth once daily       FUROsemide (LASIX) 20 MG tablet Take 1 tablet (20 mg total) by mouth once daily 90 tablet 3   gabapentin (NEURONTIN) 300 MG capsule Take 1 capsule (300 mg total) by mouth 3 (three) times daily 90 capsule 11   ibuprofen (MOTRIN) 200 MG tablet Take by mouth as needed       letrozole (FEMARA) 2.5 mg tablet Take 2.5 mg by mouth once daily       lisinopriL (ZESTRIL) 5 MG tablet Take 1 tablet (5 mg total) by mouth once daily 90 tablet 3   magnesium oxide (MAG-OX) 400 mg (241.3 mg magnesium) tablet Take by mouth once daily       nitroGLYcerin (NITROSTAT) 0.4 MG SL tablet  PLACE 1 TABLET UNDER TONGUE EVERY 5 MINUTES IF NEEDED FOR CHEST PAIN UP TO 3 DOSES   12   ONETOUCH ULTRA BLUE TEST STRIP test strip USE 3 (THREE) TIMES DAILY BEFORE MEALS 300 strip 1   pantoprazole (PROTONIX) 40 MG DR tablet TAKE 1 TABLET (40 MG TOTAL) BY MOUTH ONCE DAILY. 90 tablet 1   potassium gluconate 2.5 mEq Tab Take 198 mg by mouth once daily       rosuvastatin (CRESTOR) 5 MG tablet Take 1 tab twice a week 30 tablet 3   spironolactone (ALDACTONE) 25 MG tablet TAKE 1/2 TABLET BY MOUTH ONCE DAILY 15 tablet 11   triamcinolone 0.1 % cream APPLY TOPICALLY 2 (TWO) TIMES DAILY APPLY TO RASH TWICE DAILY. (Patient taking differently: Apply topically as needed Apply to rash twice daily.) 30 g 3   aspirin 325 MG EC tablet Take 325 mg by mouth once daily (Patient not taking: Reported on 05/02/2021)   0   blood glucose diagnostic, drum test strip Use 3 (three) times daily before meals for 180 days Use as instructed. (Patient not taking: Reported on 01/29/2021) 300 each 1   blood glucose meter kit Use 3 (three) times daily before meals (Patient not taking: Reported on 01/29/2021) 1 each 0   FUROsemide (LASIX) 20 MG tablet Take 1 tablet (20 mg total) by mouth once daily (Patient taking differently: Take 20 mg by mouth once daily as needed) 30 tablet 11   lancets Use 1 each 3 (three) times daily before meals Use as instructed. (Patient not taking: Reported on 01/29/2021) 300 each 1    No current facility-administered medications for this visit.             Family History  Problem Relation Age of Onset  Cancer Mother     Heart disease Mother     Breast cancer Mother 58   Myocardial Infarction (Heart attack) Mother     Diabetes Father     Colon cancer Neg Hx          Labs and Radiology:    Bone scan completed May 01, 2021: No evidence of metastatic disease.       Review of Systems  Constitutional: Negative for chills and fever.  Respiratory: Negative for choking.          Objective:    Physical Exam Exam conducted with a chaperone present.  Constitutional:      Appearance: Normal appearance.  Cardiovascular:     Rate and Rhythm: Normal rate.     Pulses: Normal pulses.     Heart sounds: Normal heart sounds.  Pulmonary:     Effort: Pulmonary effort is normal.  Chest:       Comments: Wide excision site healing very nicely.  1 mm wide, 15 mm long area of cannulation tissue in the midportion treated with silver nitrate with moderate discomfort which resolved by the end of the visit.  Good shoulder range of motion. Neurological:     Mental Status: She is alert and oriented to person, place, and time.  Psychiatric:        Mood and Affect: Mood normal.        Behavior: Behavior normal.           Assessment:     Doing well post drainage of a left breast abscess post wide excision.   Plans for adjuvant chemotherapy reviewed.    Plan:     The majority of today's visit was related to port placement plans and review of medical oncology goals.   Indications for PowerPort placement discussed.  Risks associate with the procedure including injury to the lung and vasculature and neural structures was reviewed.  She has fairly modest veins on the right upper extremity it would be unlikely she would be able to complete a course of adjuvant treatment without central venous access.   She reported that there was some discussion about the possible need for a bone marrow biopsy.  At this time this is not on her personal "to do list". Surgery as scheduled.     This note is partially prepared by Ledell Noss, CMA acting as a scribe in the presence of Dr. Hervey Ard, MD.    The documentation recorded by the scribe accurately reflects the service I personally performed and the decisions made by me.    Robert Bellow, MD FACS

## 2021-05-03 ENCOUNTER — Other Ambulatory Visit
Admission: RE | Admit: 2021-05-03 | Discharge: 2021-05-03 | Disposition: A | Discharge status: Home / Self Care | Payer: 59 | Source: Ambulatory Visit | Attending: General Surgery | Admitting: General Surgery

## 2021-05-03 ENCOUNTER — Ambulatory Visit
Admission: RE | Admit: 2021-05-03 | Discharge: 2021-05-03 | Disposition: A | Discharge status: Home / Self Care | Payer: 59 | Source: Ambulatory Visit | Attending: Oncology | Admitting: Oncology

## 2021-05-03 ENCOUNTER — Other Ambulatory Visit: Payer: Self-pay

## 2021-05-03 DIAGNOSIS — J449 Chronic obstructive pulmonary disease, unspecified: Secondary | ICD-10-CM | POA: Diagnosis not present

## 2021-05-03 DIAGNOSIS — I213 ST elevation (STEMI) myocardial infarction of unspecified site: Secondary | ICD-10-CM | POA: Insufficient documentation

## 2021-05-03 DIAGNOSIS — I10 Essential (primary) hypertension: Secondary | ICD-10-CM | POA: Diagnosis not present

## 2021-05-03 DIAGNOSIS — Z01810 Encounter for preprocedural cardiovascular examination: Secondary | ICD-10-CM | POA: Insufficient documentation

## 2021-05-03 DIAGNOSIS — C50912 Malignant neoplasm of unspecified site of left female breast: Secondary | ICD-10-CM | POA: Insufficient documentation

## 2021-05-03 HISTORY — DX: Dyspnea, unspecified: R06.00

## 2021-05-03 LAB — ECHOCARDIOGRAM COMPLETE
AR max vel: 1.86 cm2
AV Area VTI: 2 cm2
AV Area mean vel: 2.05 cm2
AV Mean grad: 2 mmHg
AV Peak grad: 5.4 mmHg
Ao pk vel: 1.16 m/s
Area-P 1/2: 6.37 cm2
Calc EF: 20.2 %
S' Lateral: 4.5 cm
Single Plane A2C EF: 11.7 %
Single Plane A4C EF: 26.5 %

## 2021-05-03 NOTE — Patient Instructions (Addendum)
Your procedure is scheduled on: Wednesday May 15, 2021. Report to Day Surgery. To find out your arrival time please call [phone] between 1PM - 3PM on Tuesday May 14, 2021.  Remember: Instructions that are not followed completely may result in serious medical risk,  up to and including death, or upon the discretion of your surgeon and anesthesiologist your  surgery may need to be rescheduled.     _X__ 1. Do not eat food after midnight the night before your procedure.                 No chewing gum or hard candies. You may drink clear liquids up to 2 hours                 before you are scheduled to arrive for your surgery- DO not drink clear                 liquids within 2 hours of the start of your surgery.                 Clear Liquids include:  water, apple juice without pulp, clear Gatorade, G2 or                  Gatorade Zero (avoid Red/Purple/Blue), Black Coffee or Tea (Do not add                 anything to coffee or tea).  __X__2.  On the morning of surgery brush your teeth with toothpaste and water, you                may rinse your mouth with mouthwash if you wish.  Do not swallow any toothpaste of mouthwash.     _X__ 3.  No Alcohol for 24 hours before or after surgery.   _X__ 4.  Do Not Smoke or use e-cigarettes For 24 Hours Prior to Your Surgery.                 Do not use any chewable tobacco products for at least 6 hours prior to                 Surgery.  _X__  5.  Do not use any recreational drugs (marijuana, cocaine, heroin, ecstasy, MDMA or other)                For at least one week prior to your surgery.  Combination of these drugs with anesthesia                May have life threatening results.  __X__ 6.  Notify your doctor if there is any change in your medical condition      (cold, fever, infections).     Do not wear jewelry, make-up, hairpins, clips or nail polish. Do not wear lotions, powders, or perfumes. You may  wear deodorant. Do not shave 48 hours prior to surgery.  Do not bring valuables to the hospital.    [facility] is not responsible for any belongings or valuables.  Contacts, dentures or bridgework may not be worn into surgery. Leave your suitcase in the car. After surgery it may be brought to your room. For patients admitted to the hospital, discharge time is determined by your treatment team.   Patients discharged the day of surgery will not be allowed to drive home.   Make arrangements for someone to be with you for the first 24 hours of your Same  Day Discharge.   __X__ Take these medicines the morning of surgery with A SIP OF WATER:    1. carvedilol (COREG) 3.125 MG  2. gabapentin (NEURONTIN) 300 MG  3. letrozole (FEMARA) 2.5 MG   4.  5.  6.  ____ Fleet Enema (as directed)   __X__ Use CHG Soap (or wipes) as directed  ____ Use Benzoyl Peroxide Gel as instructed  __X__ Use inhalers on the day of surgery  albuterol (VENTOLIN HFA) 108 (90 Base) MCG/ACT inhaler  ____ Stop metformin 2 days prior to surgery    ____ Take 1/2 of usual insulin dose the night before surgery. No insulin the morning          of surgery.   ____ Call your PCP, cardiologist, or Pulmonologist if taking Coumadin/Plavix/aspirin and ask when to stop before your surgery.   __X__ One Week prior to surgery- Stop Anti-inflammatories such as Ibuprofen, Aleve, Advil, Motrin, meloxicam (MOBIC), diclofenac, etodolac, ketorolac, Toradol, Daypro, piroxicam, Goody's or BC powders. OK TO USE TYLENOL IF NEEDED   __X__ Stop supplements until after surgery.    ____ Bring C-Pap to the hospital.    If you have any questions regarding your pre-procedure instructions,  Please call Pre-admit Testing at [phone]

## 2021-05-03 NOTE — Progress Notes (Signed)
*  PRELIMINARY RESULTS* Echocardiogram 2D Echocardiogram has been performed.  Sherrie Sport 05/03/2021, 9:47 AM

## 2021-05-07 ENCOUNTER — Telehealth: Payer: Self-pay

## 2021-05-07 NOTE — Telephone Encounter (Signed)
Please review Echo report.  Are there any changes in treatment plan?

## 2021-05-14 NOTE — Telephone Encounter (Signed)
Returned call to patient per Dr. Gary Fleet request to follow-up on message left by him on 9/9. Pt answered and stated she had received his message to call and schedule follow-up, but she wanted him to call her back first. I informed patient that I was calling to schedule the communication between her and Dr, Grayland Ormond so they could discuss how they were going to move forward with her care. PT still refused to schedule any appts with me again. Dr. Grayland Ormond was notified of patients refusal. Will proceed with scheduling patient via Dr. Gary Fleet recommendation.

## 2021-05-15 ENCOUNTER — Ambulatory Visit: Admit: 2021-05-15 | Payer: 59 | Admitting: General Surgery

## 2021-05-15 SURGERY — INSERTION, TUNNELED CENTRAL VENOUS DEVICE, WITH PORT
Anesthesia: Monitor Anesthesia Care | Laterality: Right

## 2021-05-17 ENCOUNTER — Encounter: Payer: Self-pay | Admitting: Oncology

## 2021-05-20 NOTE — Progress Notes (Deleted)
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  INTERVAL HISTORY: Patient agreed to video assisted telemedicine visit for further evaluation and treatment planning.  She recently had consultation at [facility].  She also had abscess draining at the site of her surgery, but states this is healing.  She continues to be anxious.  She is tolerating letrozole only occasional hot flashes that do not disrupt her day-to-day activity.  She has no neurologic complaints.  She denies any recent fevers or illnesses.  She has a good appetite and denies weight loss.  She has no chest pain, shortness of breath, cough, or hemoptysis.  She denies any nausea, vomiting, constipation, or diarrhea.  She has no urinary complaints.  Patient offers no further specific complaints today.  REVIEW OF SYSTEMS:   Review of Systems  Constitutional: Negative.  Negative for fever, malaise/fatigue and weight loss.  Respiratory: Negative.  Negative for cough, hemoptysis and shortness of breath.   Cardiovascular: Negative.  Negative for chest pain and leg swelling.  Gastrointestinal: Negative.  Negative for abdominal pain.  Genitourinary: Negative.  Negative for frequency.  Musculoskeletal: Negative.  Negative for back pain.  Skin: Negative.  Negative for rash.  Neurological:  Positive for sensory change. Negative for dizziness, focal weakness, weakness and headaches.  Psychiatric/Behavioral:  The patient is nervous/anxious.    As per HPI. Otherwise, a complete review of systems is negative.  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   GERD (gastroesophageal reflux disease)    History of 2019 novel coronavirus disease (COVID-19) 04/23/2020   HLD (hyperlipidemia)    HTN (hypertension)    Invasive carcinoma of breast ([place]) 01/23/2021   LEFT invasive mammary carcinoma; ER/PR (+), HER2/neu (+)   Ischemic cardiomyopathy    s/p AICD placement   ST elevation myocardial infarction (STEMI) of anterior wall, initial episode of care ([place]) 02/24/2016   DES x 1 to pLAD   Statin intolerance    T2DM (type 2 diabetes mellitus) ([place])    Tobacco abuse    quit smoking 2017    PAST SURGICAL HISTORY: Past Surgical History:  Procedure Laterality Date   BREAST BIOPSY Right 12/10/2016   neg bx FIBROADENOMATOUS CHANGES WITH STROMAL AND LUMINAL CALCIFICATIONS. NEGATIVE   BREAST LUMPECTOMY Left 03/15/2021   lumpectomy with NL bracketing   BREAST LUMPECTOMY WITH NEEDLE LOCALIZATION Left 03/15/2021   Procedure: BREAST LUMPECTOMY WITH NEEDLE LOCALIZATION;  Surgeon: Robert Bellow, MD;  Location: [facility];  Service: General;  Laterality: Left;   CARDIAC CATHETERIZATION N/A 02/24/2016   Procedure: Left Heart Cath and Coronary Angiography;  Surgeon: Troy Sine, MD;  Location: [place] [facility];  Service: Cardiovascular;  Laterality: N/A;   CARDIAC CATHETERIZATION N/A 02/24/2016   Procedure: Coronary Stent Intervention (3.2518 mm Xience Alpine DES  stent);  Surgeon: Troy Sine, MD;  Location: [place] [facility];  Service: Cardiovascular   CHOLECYSTECTOMY N/A 02/02/2019   Procedure: LAPAROSCOPIC CHOLECYSTECTOMY - LATEX ALLERGY;  Surgeon: Olean Ree, MD;  Location: [facility];   Service: General;  Laterality: N/A;   IMPLANTABLE CARDIOVERTER DEFIBRILLATOR (ICD) GENERATOR CHANGE Left 10/22/2018   Procedure: ICD GENERATOR INSERTION;  Surgeon: Marzetta Board, MD;  Location: [facility];  Service: Cardiovascular;  Laterality: Left;   INCISION AND DRAINAGE ABSCESS Left 04/09/2021   Procedure: INCISION AND DRAINAGE ABSCESS;  Surgeon: Robert Bellow, MD;  Location: [facility];  Service: General;  Laterality: Left;  breast   IR BILIARY DRAIN PLACEMENT WITH CHOLANGIOGRAM      FAMILY HISTORY: Family History  Problem Relation Age of Onset   Cancer Mother    Heart disease Mother    Heart attack Mother    Breast cancer Mother    Diabetes Father     ADVANCED DIRECTIVES (Y/N):  N  HEALTH MAINTENANCE: Social History   Tobacco Use   Smoking status: Former    Packs/day: 0.50    Years: 34.00    Pack years: 17.00    Types: Cigarettes    Quit date: 03/19/2016    Years since quitting: 5.1   Smokeless tobacco: Never  Vaping Use   Vaping Use: Never used  Substance Use Topics   Alcohol use: Yes    Alcohol/week: 0.0 standard drinks    Comment: occ   Drug use: No     Colonoscopy:  PAP:  Bone density:  Lipid panel:  Allergies  Allergen Reactions   Statins Rash and Other (See Comments)    Hip pain, constant    Elastic Bandages & [Zinc] Rash    Paper tape should be okay   Entresto [Sacubitril-Valsartan] Palpitations   Iron Nausea And Vomiting   Latex Dermatitis   Nsaids     Told not to take d/t heart status   Oxycodone Anxiety    Current Outpatient Medications  Medication Sig Dispense Refill   acetaminophen (TYLENOL) 500 MG tablet Take 2 tablets (1,000 mg total) by mouth every 6 (six) hours as needed for mild pain, moderate pain or headache. 30 tablet 0   albuterol (VENTOLIN HFA) 108 (90 Base) MCG/ACT inhaler Inhale 1-2 puffs into the lungs every 6 (six) hours as needed for wheezing or shortness of breath.     aspirin 325 MG EC tablet Take 325 mg by mouth  daily.     carvedilol (COREG) 3.125 MG tablet Take 3.125 mg by mouth 2 (two) times daily with a meal.     ciprofloxacin (CIPRO) 500 MG tablet Take 500 mg by mouth 2 (two) times daily.     furosemide (LASIX) 20 MG tablet Take 20 mg by mouth daily.     gabapentin (NEURONTIN) 300 MG capsule Take 300 mg by mouth 3 (three) times daily.     HYDROcodone-acetaminophen (NORCO/VICODIN) 5-325 MG tablet Take 1 tablet by mouth every 4 (four) hours as needed for moderate pain. (Patient not taking: No sig reported) 15 tablet 0   ibuprofen (ADVIL) 200 MG tablet Take 600 mg by mouth every 6 (six) hours as needed for headache or moderate pain.     letrozole (FEMARA) 2.5 MG tablet Take 1 tablet (2.5 mg total) by mouth daily. 90 tablet 3   lisinopril (PRINIVIL,ZESTRIL) 2.5 MG tablet Take 1 tablet (2.5 mg total) by mouth daily. PLEASE CONTACT OFFICE FOR ADDITIONAL REFILLS 30 tablet 0   magnesium  oxide (MAG-OX) 400 MG tablet Take 400 mg by mouth daily.     nitroGLYCERIN (NITROSTAT) 0.4 MG SL tablet PLACE 1 TABLET UNDER TONGUE EVERY 5 MINUTES IF NEEDED FOR CHEST PAIN UP TO 3 DOSES 25 tablet 5   Potassium 99 MG TABS Take 198 mg by mouth every evening.     rosuvastatin (CRESTOR) 5 MG tablet Take 5 mg by mouth 2 (two) times a week.     spironolactone (ALDACTONE) 25 MG tablet Take 0.5 tablets (12.5 mg total) by mouth daily. (Patient taking differently: Take 12.5 mg by mouth every evening.) 30 tablet 6   triamcinolone cream (KENALOG) 0.1 % Apply 1 application topically 2 (two) times daily.  3   vitamin B-12 (CYANOCOBALAMIN) 250 MCG tablet Take 250 mcg by mouth daily.     No current facility-administered medications for this visit.    OBJECTIVE: There were no vitals filed for this visit.     There is no height or weight on file to calculate BMI.    ECOG FS:0 - Asymptomatic  General: Well-developed, well-nourished, no acute distress. HEENT: Normocephalic. Neuro: Alert, answering all questions appropriately. Cranial  nerves grossly intact. Psych: Normal affect. But  LAB RESULTS:  Lab Results  Component Value Date   NA 135 04/09/2021   K 4.5 04/09/2021   CL 102 04/09/2021   CO2 23 04/09/2021   GLUCOSE 130 (H) 04/09/2021   BUN 22 (H) 04/09/2021   CREATININE 0.93 04/09/2021   CALCIUM 9.6 04/09/2021   PROT 6.8 02/02/2019   ALBUMIN 3.7 02/02/2019   AST 20 02/02/2019   ALT 25 02/02/2019   ALKPHOS 88 02/02/2019   BILITOT 0.5 02/02/2019   GFRNONAA >60 04/09/2021   GFRAA >60 02/02/2019    Lab Results  Component Value Date   WBC 13.7 (H) 04/09/2021   NEUTROABS 7.9 (H) 04/09/2021   HGB 13.9 04/09/2021   HCT 40.6 04/09/2021   MCV 95.8 04/09/2021   PLT 560 (H) 04/09/2021     STUDIES: NM Bone Scan Whole Body  Result Date: 05/01/2021 CLINICAL DATA:  Breast cancer workup in a 60 year old female. EXAM: NUCLEAR MEDICINE WHOLE BODY BONE SCAN TECHNIQUE: Whole body anterior and posterior images were obtained approximately 3 hours after intravenous injection of radiopharmaceutical. RADIOPHARMACEUTICALS:  22.89 mCi Technetium-43mMDP IV COMPARISON:  CT of the chest, abdomen and pelvis from August of 2022. FINDINGS:W: FINDINGS:W Radiotracer excretion from the bilateral kidneys. Radiotracer passes into the urinary bladder. Areas of asymmetric accumulation in the RIGHT lower cervical spine, midthoracic spine and in the LEFT lower lumbar spine are favored to represent sequela of degenerative change. No signs of radiotracer accumulation to suggest metastatic disease at this time. IMPRESSION: No scintigraphic evidence of metastatic disease. Electronically Signed   By: GZetta BillsM.D.   On: 05/01/2021 13:26   ECHOCARDIOGRAM COMPLETE  Result Date: 05/03/2021    ECHOCARDIOGRAM REPORT   Patient Name:   Erika ADOLPHDate of Exam: 05/03/2021 Medical Rec #:  [id number]      Height:       63.0 in Accession #:    [id number]     Weight:       201.3 lb Date of Birth:  [birth year]      BSA:          1.939 m Patient Age:     89years        BP:           108/74 mmHg Patient Gender: F  HR:           68 bpm. Exam Location:  [facility] Procedure: 2D Echo, Cardiac Doppler and Color Doppler Indications:     Chemo Z09  History:         Patient has prior history of Echocardiogram examinations, most                  recent 04/02/2016. AICD, COPD; Risk Factors:Hypertension. STEMI.  Sonographer:     Sherrie Sport Referring Phys:  [place] Diagnosing Phys: Neoma Laming  Sonographer Comments: Suboptimal apical window. IMPRESSIONS  1. Left ventricular ejection fraction, by estimation, is 20 to 25%. The left ventricle has severely decreased function. The left ventricle demonstrates global hypokinesis. The left ventricular internal cavity size was severely dilated. There is mild concentric left ventricular hypertrophy. Left ventricular diastolic function could not be evaluated.  2. Right ventricular systolic function is mildly reduced. The right ventricular size is mildly enlarged. Mildly increased right ventricular wall thickness.  3. Left atrial size was moderately dilated.  4. Right atrial size was moderately dilated.  5. The mitral valve is degenerative. Trivial mitral valve regurgitation.  6. The aortic valve is calcified. Aortic valve regurgitation not assessed.  7. Pulmonic valve regurgitation not assessed. 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 FINDINGS  Left Ventricle: Left ventricular ejection fraction, by estimation, is 20 to 25%. The left ventricle has severely decreased function. The left ventricle demonstrates global hypokinesis. The left ventricular internal cavity size was severely dilated. There is mild concentric left ventricular hypertrophy. Left ventricular diastolic function could not be evaluated. Right Ventricle: The right ventricular size is mildly enlarged. Mildly increased right ventricular wall thickness. Right ventricular  systolic function is mildly reduced. Left Atrium: Left atrial size was moderately dilated. Right Atrium: Right atrial size was moderately dilated. Pericardium: The pericardium was not well visualized. Mitral Valve: The mitral valve is degenerative in appearance. Trivial mitral valve regurgitation. Tricuspid Valve: The tricuspid valve is grossly normal. Tricuspid valve regurgitation is trivial. Aortic Valve: The aortic valve is calcified. Aortic valve regurgitation not assessed. Aortic valve mean gradient measures 2.0 mmHg. Aortic valve peak gradient measures 5.4 mmHg. Aortic valve area, by VTI measures 2.00 cm. Pulmonic Valve: The pulmonic valve was not assessed. Pulmonic valve regurgitation not assessed. Aorta: The aortic root, ascending aorta and aortic arch are all structurally normal, with no evidence of dilitation or obstruction. IAS/Shunts: The interatrial septum was not assessed.  LEFT VENTRICLE PLAX 2D LVIDd:         5.00 cm      Diastology LVIDs:         4.50 cm      LV e' medial:    4.90 cm/s LV PW:         0.80 cm      LV E/e' medial:  12.7 LV IVS:        0.90 cm      LV e' lateral:   4.68 cm/s LVOT diam:     2.00 cm      LV E/e' lateral: 13.3 LV SV:         42 LV SV Index:   22 LVOT Area:     3.14 cm  LV Volumes (MOD) LV vol d, MOD A2C: 120.0 ml LV vol d, MOD A4C: 170.0 ml LV vol s, MOD A2C: 106.0 ml LV vol s, MOD A4C: 125.0 ml LV SV MOD A2C:     14.0 ml LV SV  MOD A4C:     170.0 ml LV SV MOD BP:      29.1 ml RIGHT VENTRICLE RV S prime:     15.30 cm/s TAPSE (M-mode): 4.2 cm LEFT ATRIUM             Index       RIGHT ATRIUM           Index LA diam:        4.00 cm 2.06 cm/m  RA Area:     14.10 cm LA Vol (A2C):   63.1 ml 32.54 ml/m RA Volume:   36.70 ml  18.93 ml/m LA Vol (A4C):   65.4 ml 33.73 ml/m LA Biplane Vol: 69.1 ml 35.64 ml/m  AORTIC VALVE AV Area (Vmax):    1.86 cm AV Area (Vmean):   2.05 cm AV Area (VTI):     2.00 cm AV Vmax:           116.00 cm/s AV Vmean:          68.400 cm/s AV VTI:             0.211 m AV Peak Grad:      5.4 mmHg AV Mean Grad:      2.0 mmHg LVOT Vmax:         68.50 cm/s LVOT Vmean:        44.600 cm/s LVOT VTI:          0.134 m LVOT/AV VTI ratio: 0.64 MITRAL VALVE               TRICUSPID VALVE MV Area (PHT): 6.37 cm    TR Peak grad:   12.4 mmHg MV Decel Time: 119 msec    TR Vmax:        176.00 cm/s MV E velocity: 62.10 cm/s MV A velocity: 96.00 cm/s  SHUNTS MV E/A ratio:  0.65        Systemic VTI:  0.13 m                            Systemic Diam: 2.00 cm Neoma Laming Electronically signed by Neoma Laming Signature Date/Time: 05/03/2021/10:59:02 AM    Final      ASSESSMENT: 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  PLAN:    1.  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  Case discussed with surgery and cardiology.  Patient also had consultation with plastic surgery.  It was decided that it was too dangerous to undergo reconstruction and patient instead underwent lobectomy with axillary node dissection on March 15, 2021.   Continue neoadjuvant letrozole.  CT scan on April 18, 2021 did not reveal any metastatic disease.  Will get nuclear med bone scan to complete the staging work-up.  Case discussed with Dr. Annabell Sabal at [facility] and recommendation is to proceed with Taxotere, Cytoxan, and Herceptin and check BNP and cardiac enzymes with each cycle.  Will repeat echo prior to initiating treatment and then repeat echo after every 2 infusions.  Can consider adding Perjeta after several cycles if cardiac status remains stable.  Patient will require port placement and further healing of her axillary abscess prior to initiating treatment.  Continue letrozole as prescribed.  Return to clinic in 1 to 2 weeks for further evaluation and possible initiation of treatment.    2.  Cardiomyopathy: Patient's most recent cardiac  echo on February 12, 2021 reported ejection fraction of 30%.  Appreciate cardiology input.    Patient expressed understanding and was in agreement with this plan. She also understands that She can call clinic at any time with any questions, concerns, or complaints.   Cancer Staging Carcinoma of breast, female, left [facility]) Staging form: Breast, AJCC 8th Edition - Pathologic stage from 03/25/2021: Stage IIIA (pT2, pN3a, cM0, G2, ER+, PR+, HER2+) - Signed by Lloyd Huger, MD on 03/25/2021 Histologic grading system: 3 grade system   Lloyd Huger, MD   05/20/2021 11:56 AM

## 2021-05-21 ENCOUNTER — Other Ambulatory Visit: Payer: Self-pay | Admitting: General Surgery

## 2021-05-21 ENCOUNTER — Telehealth: Payer: Self-pay | Admitting: Licensed Clinical Social Worker

## 2021-05-21 DIAGNOSIS — C50412 Malignant neoplasm of upper-outer quadrant of left female breast: Secondary | ICD-10-CM

## 2021-05-21 NOTE — Telephone Encounter (Signed)
Patient has an appointment with Dr. Grayland Ormond on Thursday at 10:45, she is under the impression she doesn't need to see him anymore. We have a referral to see her for radiation which we made for Thursday at 11:00.  I told her she still needs an oncologist but she said she only sees Dr. Bary Castilla and now Dr. Baruch Gouty.  Message was sent to Dr. Grayland Ormond and his team. Patient called and cancelled her appointment with Dr. Grayland Ormond for this Thursday. Patient is agreeable to seeing Dr. Baruch Gouty for radiation consult on Thursday at 11:00.

## 2021-05-23 ENCOUNTER — Inpatient Hospital Stay: Payer: 59 | Admitting: Oncology

## 2021-05-23 ENCOUNTER — Ambulatory Visit
Admission: RE | Admit: 2021-05-23 | Discharge: 2021-05-23 | Disposition: A | Discharge status: Home / Self Care | Payer: 59 | Source: Ambulatory Visit | Attending: Radiation Oncology | Admitting: Radiation Oncology

## 2021-05-23 DIAGNOSIS — C50412 Malignant neoplasm of upper-outer quadrant of left female breast: Secondary | ICD-10-CM | POA: Insufficient documentation

## 2021-05-23 DIAGNOSIS — I509 Heart failure, unspecified: Secondary | ICD-10-CM | POA: Insufficient documentation

## 2021-05-23 DIAGNOSIS — E119 Type 2 diabetes mellitus without complications: Secondary | ICD-10-CM | POA: Diagnosis not present

## 2021-05-23 DIAGNOSIS — K219 Gastro-esophageal reflux disease without esophagitis: Secondary | ICD-10-CM | POA: Diagnosis not present

## 2021-05-23 DIAGNOSIS — I255 Ischemic cardiomyopathy: Secondary | ICD-10-CM | POA: Insufficient documentation

## 2021-05-23 DIAGNOSIS — I2510 Atherosclerotic heart disease of native coronary artery without angina pectoris: Secondary | ICD-10-CM | POA: Diagnosis not present

## 2021-05-23 DIAGNOSIS — Z87891 Personal history of nicotine dependence: Secondary | ICD-10-CM | POA: Diagnosis not present

## 2021-05-23 DIAGNOSIS — Z803 Family history of malignant neoplasm of breast: Secondary | ICD-10-CM | POA: Insufficient documentation

## 2021-05-23 DIAGNOSIS — E785 Hyperlipidemia, unspecified: Secondary | ICD-10-CM | POA: Insufficient documentation

## 2021-05-23 DIAGNOSIS — Z923 Personal history of irradiation: Secondary | ICD-10-CM | POA: Insufficient documentation

## 2021-05-23 DIAGNOSIS — Z8616 Personal history of COVID-19: Secondary | ICD-10-CM | POA: Diagnosis not present

## 2021-05-23 DIAGNOSIS — I10 Essential (primary) hypertension: Secondary | ICD-10-CM | POA: Diagnosis not present

## 2021-05-23 DIAGNOSIS — I252 Old myocardial infarction: Secondary | ICD-10-CM | POA: Diagnosis not present

## 2021-05-23 DIAGNOSIS — Z809 Family history of malignant neoplasm, unspecified: Secondary | ICD-10-CM | POA: Diagnosis not present

## 2021-05-23 DIAGNOSIS — C50912 Malignant neoplasm of unspecified site of left female breast: Secondary | ICD-10-CM

## 2021-05-23 DIAGNOSIS — Z9221 Personal history of antineoplastic chemotherapy: Secondary | ICD-10-CM | POA: Diagnosis not present

## 2021-05-23 DIAGNOSIS — Z170 Estrogen receptor positive status [ER+]: Secondary | ICD-10-CM | POA: Diagnosis not present

## 2021-05-23 DIAGNOSIS — J449 Chronic obstructive pulmonary disease, unspecified: Secondary | ICD-10-CM | POA: Insufficient documentation

## 2021-05-23 NOTE — Consult Note (Signed)
NEW PATIENT EVALUATION  Name: Erika Rios  MRN: [id number]  Date:   05/23/2021     DOB: [birth year]   This 60 y.o. 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  REFERRING PHYSICIAN: Tracie Harrier, MD  CHIEF COMPLAINT:  Chief Complaint  Patient presents with   Breast Cancer    Initial consultation    DIAGNOSIS: The encounter diagnosis was Carcinoma of breast, female, left ([place]).   PREVIOUS INVESTIGATIONS:  Pathology report reviewed Clinical notes reviewed Mammogram CT scan chest abdomen pelvis and bone scan all reviewed  HPI: Patient is a 60 year old female who presented with self discovered mass in the left breast.  Left breast mammogram showed highly suspicious suspicious masslike distortion posterior lateral left breast with suspicious calcifications spanning the lateral left breast.  These spanned proximally 12.2 cm.  She also had suspicious axillary lymph nodes.  She underwent biopsy which was positive for invasive mammary carcinoma.  Based on consultation with cardiology and surgery it was opted not to undergo mastectomy with reconstruction and the patient underwent wide local excision by Dr. Tollie Pizza.  She was on neoadjuvant letrozole.  At the time of resection tumor was 4.7 x 4.5 x 3.7 cm.  That was a size of tumor 1 size of tumor 2 was 15 x 14 x 13 mm.  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  All margins were negative.  13 lymph nodes were examined and 10 lymph nodes were positive for metastatic disease with extracapsular extension.  Bone scan CT scan of chest abdomen pelvis showed no evidence of metastatic disease.  Patient insists has been slow to heal although is completely healed at this time.  She has been seen by medical oncology with recommendation for adjuvant chemotherapy  although patient is adamantly refusing chemotherapy at this time although has not followed up with Dr. Grayland Ormond.  Today she specifically denies breast tenderness cough or bone pain.  Patient had recent echocardiogram showing ejection fraction of only 20 to 25%.  PLANNED TREATMENT REGIMEN: Left whole breast and peripheral lymphatic radiation  PAST MEDICAL HISTORY:  has a past medical history of AICD (automatic cardioverter/defibrillator) present (10/22/2018), CAD (coronary artery disease), CHF (congestive heart failure) ([place]), COPD (chronic obstructive pulmonary disease) ([place]), DOE (dyspnea on exertion), Dyspnea, Family history of adverse reaction to anesthesia, GERD (gastroesophageal reflux disease), History of 2019 novel coronavirus disease (COVID-19) (04/23/2020), HLD (hyperlipidemia), HTN (hypertension), Invasive carcinoma of breast ([place]) (01/23/2021), Ischemic cardiomyopathy, ST elevation myocardial infarction (STEMI) of anterior wall, initial episode of care [facility]) (02/24/2016), Statin intolerance, T2DM (type 2 diabetes mellitus) ([place]), and Tobacco abuse.    PAST SURGICAL HISTORY:  Past Surgical History:  Procedure Laterality Date   BREAST BIOPSY Right 12/10/2016   neg bx FIBROADENOMATOUS CHANGES WITH STROMAL AND LUMINAL CALCIFICATIONS. NEGATIVE   BREAST LUMPECTOMY Left 03/15/2021   lumpectomy with NL bracketing   BREAST LUMPECTOMY WITH NEEDLE LOCALIZATION Left 03/15/2021   Procedure: BREAST LUMPECTOMY WITH NEEDLE LOCALIZATION;  Surgeon: Robert Bellow, MD;  Location: [facility] ORS;  Service: General;  Laterality: Left;   CARDIAC CATHETERIZATION N/A 02/24/2016   Procedure: Left Heart Cath and Coronary Angiography;  Surgeon: Troy Sine, MD;  Location: [place] [facility];  Service: Cardiovascular;  Laterality: N/A;   CARDIAC CATHETERIZATION N/A 02/24/2016   Procedure: Coronary Stent Intervention (3.2518 mm Xience [place] DES  stent);  Surgeon: Troy Sine, MD;  Location: [place] [facility];   Service: Cardiovascular   CHOLECYSTECTOMY N/A 02/02/2019   Procedure: LAPAROSCOPIC CHOLECYSTECTOMY - LATEX ALLERGY;  Surgeon: Olean Ree, MD;  Location: [facility] OR[facility];  Service: General;  Laterality: N/A;   IMPLANTABLE CARDIOVERTER DEFIBRILLATOR (ICD) GENERATOR CHANGE Left 10/22/2018   Procedure: ICD GENERATOR INSERTION;  Surgeon: Marzetta Board, MD;  Location: [facility];  Service: Cardiovascular;  Laterality: Left;   INCISION AND DRAINAGE ABSCESS Left 04/09/2021   Procedure: INCISION AND DRAINAGE ABSCESS;  Surgeon: Robert Bellow, MD;  Location: [facility];  Service: General;  Laterality: Left;  breast   IR BILIARY DRAIN PLACEMENT WITH CHOLANGIOGRAM      FAMILY HISTORY: family history includes Breast cancer in her mother; Cancer in her mother; Diabetes in her father; Heart attack in her mother; Heart disease in her mother.  SOCIAL HISTORY:  reports that she quit smoking about 5 years ago. Her smoking use included cigarettes. She has a 17.00 pack-year smoking history. She has never used smokeless tobacco. She reports current alcohol use. She reports that she does not use drugs.  ALLERGIES: Statins, Elastic bandages & [zinc], Entresto [sacubitril-valsartan], Iron, Latex, Nsaids, and Oxycodone  MEDICATIONS:  Current Outpatient Medications  Medication Sig Dispense Refill   acetaminophen (TYLENOL) 500 MG tablet Take 2 tablets (1,000 mg total) by mouth every 6 (six) hours as needed for mild pain, moderate pain or headache. 30 tablet 0   albuterol (VENTOLIN HFA) 108 (90 Base) MCG/ACT inhaler Inhale 1-2 puffs into the lungs every 6 (six) hours as needed for wheezing or shortness of breath.     aspirin 325 MG EC tablet Take 325 mg by mouth daily.     carvedilol (COREG) 3.125 MG tablet Take 3.125 mg by mouth 2 (two) times daily with a meal.     furosemide (LASIX) 20 MG tablet Take 20 mg by mouth daily.     gabapentin (NEURONTIN) 300 MG capsule Take 300 mg by mouth 3 (three) times daily.     ibuprofen  (ADVIL) 200 MG tablet Take 600 mg by mouth every 6 (six) hours as needed for headache or moderate pain.     letrozole (FEMARA) 2.5 MG tablet Take 1 tablet (2.5 mg total) by mouth daily. 90 tablet 3   lisinopril (ZESTRIL) 5 MG tablet Take 5 mg by mouth daily.     magnesium oxide (MAG-OX) 400 MG tablet Take 400 mg by mouth daily.     nitroGLYCERIN (NITROSTAT) 0.4 MG SL tablet PLACE 1 TABLET UNDER TONGUE EVERY 5 MINUTES IF NEEDED FOR CHEST PAIN UP TO 3 DOSES 25 tablet 5   Potassium 99 MG TABS Take 198 mg by mouth every evening.     rosuvastatin (CRESTOR) 5 MG tablet Take 5 mg by mouth 2 (two) times a week.     spironolactone (ALDACTONE) 25 MG tablet Take 0.5 tablets (12.5 mg total) by mouth daily. (Patient taking differently: Take 12.5 mg by mouth every evening.) 30 tablet 6   triamcinolone cream (KENALOG) 0.1 % Apply 1 application topically 2 (two) times daily.  3   vitamin B-12 (CYANOCOBALAMIN) 250 MCG tablet Take 250 mcg by mouth daily.     HYDROcodone-acetaminophen (NORCO/VICODIN) 5-325 MG tablet Take 1 tablet by mouth every 4 (four) hours as needed for moderate pain. (Patient not taking: No sig reported) 15 tablet 0   No current facility-administered medications for this encounter.    ECOG PERFORMANCE STATUS:  0 - Asymptomatic  REVIEW OF SYSTEMS: Patient denies any weight loss, fatigue, weakness, fever, chills or night sweats. Patient denies any loss of vision, blurred vision. Patient denies any ringing  of the ears or hearing loss. No irregular heartbeat. Patient denies heart murmur or history of fainting. Patient denies any chest pain or pain radiating to her upper extremities. Patient denies any shortness of breath, difficulty breathing at night, cough or hemoptysis. Patient denies any swelling in the lower legs. Patient denies any nausea vomiting, vomiting of blood, or coffee ground material in the vomitus. Patient denies any stomach pain. Patient states has had normal bowel movements no  significant constipation or diarrhea. Patient denies any dysuria, hematuria or significant nocturia. Patient denies any problems walking, swelling in the joints or loss of balance. Patient denies any skin changes, loss of hair or loss of weight. Patient denies any excessive worrying or anxiety or significant depression. Patient denies any problems with insomnia. Patient denies excessive thirst, polyuria, polydipsia. Patient denies any swollen glands, patient denies easy bruising or easy bleeding. Patient denies any recent infections, allergies or URI. Patient "s visual fields have not changed significantly in recent time.   PHYSICAL EXAM: BP (!) (P) 142/76 (BP Location: Right Arm, Patient Position: Sitting)   Pulse (P) 77   Temp (P) 97.7 F (36.5 C) (Tympanic)   Resp (P) 16   Wt (P) 207 lb 4.8 oz (94 kg)   BMI (P) 37.31 kg/m patient has extremely large pendulous breast.  Left breast is healed well with some granulation tissue in her scar.  No dominant masses noted in either breast.  No axillary or supraclavicular adenopathy is appreciated. Well-developed well-nourished patient in NAD. HEENT reveals PERLA, EOMI, discs not visualized.  Oral cavity is clear. No oral mucosal lesions are identified. Neck is clear without evidence of cervical or supraclavicular adenopathy. Lungs are clear to A&P. Cardiac examination is essentially unremarkable with regular rate and rhythm without murmur rub or thrill. Abdomen is benign with no organomegaly or masses noted. Motor sensory and DTR levels are equal and symmetric in the upper and lower extremities. Cranial nerves II through XII are grossly intact. Proprioception is intact. No peripheral adenopathy or edema is identified. No motor or sensory levels are noted. Crude visual fields are within normal range.  LABORATORY DATA: Pathology report reviewed    RADIOLOGY RESULTS: Mammogram and ultrasound reviewed CT scan of chest abdomen pelvis and bone scan also  reviewed   IMPRESSION: Stage IIIa invasive mammary carcinoma of the left breast triple positive status post wide local excision and axillary lymph node dissection in 60 year old female with significant cardiomyopathy with an ejection fraction in the 20 to 25% range.  PLAN: At this time I requested the patient return to Dr. Grayland Ormond for his opinion.  She is adamant about refusing chemotherapy and is having vitamin C infusions at this time according to the patient.  I have recommended left whole breast and peripheral lymphatic radiation.  I would plan on delivering 5040 cGy 28 fractions boosting her scar another 1600 cGy using electron beam.  Risks and benefits of treatment including skin reaction fatigue alteration of blood counts possible inclusion of superficial lung and chance of lymphedema of the left upper extremity were reviewed in detail with the patient.  She seems to comprehend my treatment plan well.  I have personally set up and ordered CT simulation for the middle to end of next week having her see Dr. Grayland Ormond prior to that visit.  Patient knows to call  with any concerns.  I would like to take this opportunity to thank you for allowing me to participate in the care of your patient.Noreene Filbert, MD

## 2021-05-29 ENCOUNTER — Inpatient Hospital Stay: Payer: 59 | Attending: Oncology | Admitting: Oncology

## 2021-05-29 ENCOUNTER — Ambulatory Visit: Payer: 59

## 2021-05-29 VITALS — BP 145/72 | HR 75 | Temp 97.9°F | Resp 16 | Wt 211.1 lb

## 2021-05-29 DIAGNOSIS — Z170 Estrogen receptor positive status [ER+]: Secondary | ICD-10-CM | POA: Insufficient documentation

## 2021-05-29 DIAGNOSIS — C50912 Malignant neoplasm of unspecified site of left female breast: Secondary | ICD-10-CM | POA: Diagnosis not present

## 2021-05-29 DIAGNOSIS — Z87891 Personal history of nicotine dependence: Secondary | ICD-10-CM | POA: Insufficient documentation

## 2021-05-29 DIAGNOSIS — C50412 Malignant neoplasm of upper-outer quadrant of left female breast: Secondary | ICD-10-CM | POA: Insufficient documentation

## 2021-05-29 DIAGNOSIS — Z79811 Long term (current) use of aromatase inhibitors: Secondary | ICD-10-CM | POA: Insufficient documentation

## 2021-05-29 DIAGNOSIS — Z79899 Other long term (current) drug therapy: Secondary | ICD-10-CM | POA: Diagnosis not present

## 2021-05-29 NOTE — Progress Notes (Signed)
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  INTERVAL HISTORY: Patient has not been evaluated since April 25, 2021 canceling several appointments in the interim.  She is adamant about not undergoing adjuvant chemotherapy despite medical recommendation and has elected to proceed with adjuvant XRT.  She continues to tolerate letrozole well without significant side effects.  She has no neurologic complaints.  She denies any recent fevers or illnesses.  She has a good appetite and denies weight loss.  She has no chest pain, shortness of breath, cough, or hemoptysis.  She denies any nausea, vomiting, constipation, or diarrhea.  She has no urinary complaints.  Patient offers no further specific complaints today.  REVIEW OF SYSTEMS:   Review of Systems  Constitutional: Negative.  Negative for fever, malaise/fatigue and weight loss.  Respiratory: Negative.  Negative for cough, hemoptysis and shortness of breath.   Cardiovascular: Negative.  Negative for chest pain and leg swelling.  Gastrointestinal: Negative.  Negative for abdominal pain.  Genitourinary: Negative.  Negative for frequency.  Musculoskeletal: Negative.  Negative for back pain.  Skin: Negative.  Negative for rash.  Neurological: Negative.  Negative for dizziness, sensory change, focal weakness, weakness and headaches.  Psychiatric/Behavioral:  The patient is nervous/anxious.    As per HPI. Otherwise, a complete review of systems is negative.  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   GERD (gastroesophageal reflux disease)    History of 2019 novel coronavirus disease (COVID-19) 04/23/2020   HLD (hyperlipidemia)    HTN (hypertension)    Invasive carcinoma of breast (HCC) 01/23/2021   LEFT invasive mammary carcinoma; ER/PR (+), HER2/neu (+)   Ischemic cardiomyopathy    s/p AICD placement   ST elevation myocardial infarction (STEMI) of anterior wall, initial episode of care (HCC) 02/24/2016   DES x 1 to pLAD   Statin intolerance    T2DM (type 2 diabetes mellitus) (HCC)    Tobacco abuse    quit smoking 2017    PAST SURGICAL HISTORY: Past Surgical History:  Procedure Laterality Date   BREAST BIOPSY Right 12/10/2016   neg bx FIBROADENOMATOUS CHANGES WITH STROMAL AND LUMINAL CALCIFICATIONS. NEGATIVE   BREAST LUMPECTOMY Left 03/15/2021   lumpectomy with NL bracketing   BREAST LUMPECTOMY WITH NEEDLE LOCALIZATION Left 03/15/2021   Procedure: BREAST LUMPECTOMY WITH NEEDLE LOCALIZATION;  Surgeon: Byrnett, Jeffrey W, MD;  Location: [facility] OR[facility];  Service: General;  Laterality: Left;   CARDIAC CATHETERIZATION N/A 02/24/2016   Procedure: Left Heart Cath and Coronary Angiography;  Surgeon: Thomas A Kelly, MD;  Location: [facility];  Service: Cardiovascular;  Laterality: N/A;   CARDIAC CATHETERIZATION N/A 02/24/2016   Procedure: Coronary Stent Intervention (3.2518 mm Xience Alpine DES stent);  Surgeon: Thomas A Kelly, MD;  Location: [facility];  Service:   Cardiovascular   CHOLECYSTECTOMY N/A 02/02/2019   Procedure: LAPAROSCOPIC CHOLECYSTECTOMY - LATEX ALLERGY;  Surgeon: Olean Ree, MD;  Location: [facility];  Service: General;  Laterality: N/A;   IMPLANTABLE  CARDIOVERTER DEFIBRILLATOR (ICD) GENERATOR CHANGE Left 10/22/2018   Procedure: ICD GENERATOR INSERTION;  Surgeon: Marzetta Board, MD;  Location: [facility];  Service: Cardiovascular;  Laterality: Left;   INCISION AND DRAINAGE ABSCESS Left 04/09/2021   Procedure: INCISION AND DRAINAGE ABSCESS;  Surgeon: Robert Bellow, MD;  Location: [facility];  Service: General;  Laterality: Left;  breast   IR BILIARY DRAIN PLACEMENT WITH CHOLANGIOGRAM      FAMILY HISTORY: Family History  Problem Relation Age of Onset   Cancer Mother    Heart disease Mother    Heart attack Mother    Breast cancer Mother    Diabetes Father     ADVANCED DIRECTIVES (Y/N):  N  HEALTH MAINTENANCE: Social History   Tobacco Use   Smoking status: Former    Packs/day: 0.50    Years: 34.00    Pack years: 17.00    Types: Cigarettes    Quit date: 03/19/2016    Years since quitting: 5.2   Smokeless tobacco: Never  Vaping Use   Vaping Use: Never used  Substance Use Topics   Alcohol use: Yes    Alcohol/week: 0.0 standard drinks    Comment: occ   Drug use: No     Colonoscopy:  PAP:  Bone density:  Lipid panel:  Allergies  Allergen Reactions   Statins Rash and Other (See Comments)    Hip pain, constant    Elastic Bandages & [Zinc] Rash    Paper tape should be okay   Entresto [Sacubitril-Valsartan] Palpitations   Iron Nausea And Vomiting   Latex Dermatitis   Nsaids     Told not to take d/t heart status   Oxycodone Anxiety    Current Outpatient Medications  Medication Sig Dispense Refill   acetaminophen (TYLENOL) 500 MG tablet Take 2 tablets (1,000 mg total) by mouth every 6 (six) hours as needed for mild pain, moderate pain or headache. 30 tablet 0   albuterol (VENTOLIN HFA) 108 (90 Base) MCG/ACT inhaler Inhale 1-2 puffs into the lungs every 6 (six) hours as needed for wheezing or shortness of breath.     aspirin 325 MG EC tablet Take 325 mg by mouth daily.     carvedilol (COREG) 3.125 MG tablet Take  3.125 mg by mouth 2 (two) times daily with a meal.     furosemide (LASIX) 20 MG tablet Take 20 mg by mouth daily.     gabapentin (NEURONTIN) 300 MG capsule Take 300 mg by mouth 3 (three) times daily.     letrozole (FEMARA) 2.5 MG tablet Take 1 tablet (2.5 mg total) by mouth daily. 90 tablet 3   lisinopril (ZESTRIL) 5 MG tablet Take 5 mg by mouth daily.     magnesium oxide (MAG-OX) 400 MG tablet Take 400 mg by mouth daily.     nitroGLYCERIN (NITROSTAT) 0.4 MG SL tablet PLACE 1 TABLET UNDER TONGUE EVERY 5 MINUTES IF NEEDED FOR CHEST PAIN UP TO 3 DOSES 25 tablet 5   Potassium 99 MG TABS Take 198 mg by mouth every evening.     rosuvastatin (CRESTOR) 5 MG tablet Take 5 mg by mouth 2 (two) times a week.     spironolactone (ALDACTONE) 25 MG tablet Take 0.5 tablets (12.5 mg total) by mouth daily. (Patient taking differently: Take 12.5 mg by  mouth every evening.) 30 tablet 6   triamcinolone cream (KENALOG) 0.1 % Apply 1 application topically 2 (two) times daily.  3   vitamin B-12 (CYANOCOBALAMIN) 250 MCG tablet Take 250 mcg by mouth daily.     HYDROcodone-acetaminophen (NORCO/VICODIN) 5-325 MG tablet Take 1 tablet by mouth every 4 (four) hours as needed for moderate pain. (Patient not taking: No sig reported) 15 tablet 0   ibuprofen (ADVIL) 200 MG tablet Take 600 mg by mouth every 6 (six) hours as needed for headache or moderate pain. (Patient not taking: Reported on 05/29/2021)     No current facility-administered medications for this visit.    OBJECTIVE: Vitals:   05/29/21 1001  BP: (!) 145/72  Pulse: 75  Resp: 16  Temp: 97.9 F (36.6 C)  SpO2: 95%       Body mass index is 38 kg/m.    ECOG FS:0 - Asymptomatic  General: Well-developed, well-nourished, no acute distress. Eyes: Pink conjunctiva, anicteric sclera. HEENT: Normocephalic, moist mucous membranes. Lungs: No audible wheezing or coughing. Heart: Regular rate and rhythm. Abdomen: Soft, nontender, no obvious  distention. Musculoskeletal: No edema, cyanosis, or clubbing. Neuro: Alert, answering all questions appropriately. Cranial nerves grossly intact. Skin: No rashes or petechiae noted. Psych: Normal affect.   LAB RESULTS:  Lab Results  Component Value Date   NA 135 04/09/2021   K 4.5 04/09/2021   CL 102 04/09/2021   CO2 23 04/09/2021   GLUCOSE 130 (H) 04/09/2021   BUN 22 (H) 04/09/2021   CREATININE 0.93 04/09/2021   CALCIUM 9.6 04/09/2021   PROT 6.8 02/02/2019   ALBUMIN 3.7 02/02/2019   AST 20 02/02/2019   ALT 25 02/02/2019   ALKPHOS 88 02/02/2019   BILITOT 0.5 02/02/2019   GFRNONAA >60 04/09/2021   GFRAA >60 02/02/2019    Lab Results  Component Value Date   WBC 13.7 (H) 04/09/2021   NEUTROABS 7.9 (H) 04/09/2021   HGB 13.9 04/09/2021   HCT 40.6 04/09/2021   MCV 95.8 04/09/2021   PLT 560 (H) 04/09/2021     STUDIES: NM Bone Scan Whole Body  Result Date: 05/01/2021 CLINICAL DATA:  Breast cancer workup in a 60-year-old female. EXAM: NUCLEAR MEDICINE WHOLE BODY BONE SCAN TECHNIQUE: Whole body anterior and posterior images were obtained approximately 3 hours after intravenous injection of radiopharmaceutical. RADIOPHARMACEUTICALS:  22.89 mCi Technetium-99m MDP IV COMPARISON:  CT of the chest, abdomen and pelvis from August of 2022. FINDINGS:W: FINDINGS:W Radiotracer excretion from the bilateral kidneys. Radiotracer passes into the urinary bladder. Areas of asymmetric accumulation in the RIGHT lower cervical spine, midthoracic spine and in the LEFT lower lumbar spine are favored to represent sequela of degenerative change. No signs of radiotracer accumulation to suggest metastatic disease at this time. IMPRESSION: No scintigraphic evidence of metastatic disease. Electronically Signed   By: Geoffrey  Wile M.D.   On: 05/01/2021 13:26   ECHOCARDIOGRAM COMPLETE  Result Date: 05/03/2021    ECHOCARDIOGRAM REPORT   Patient Name:   Erika Rios Date of Exam: 05/03/2021 Medical Rec #:   [id number]       Height:       63.0 in Accession #:    [id number]      Weight:       201.3 lb Date of Birth:  06/22/1961       BSA:          1.939 m Patient Age:    60 years        BP:             108/74 mmHg Patient Gender: F               HR:           68 bpm. Exam Location:  [facility] Procedure: 2D Echo, Cardiac Doppler and Color Doppler Indications:     Chemo Z09  History:         Patient has prior history of Echocardiogram examinations, most                  recent 04/02/2016. AICD, COPD; Risk Factors:Hypertension. STEMI.  Sonographer:     Sherrie Sport Referring Phys:  [place] Diagnosing Phys: Neoma Laming  Sonographer Comments: Suboptimal apical window. IMPRESSIONS  1. Left ventricular ejection fraction, by estimation, is 20 to 25%. The left ventricle has severely decreased function. The left ventricle demonstrates global hypokinesis. The left ventricular internal cavity size was severely dilated. There is mild concentric left ventricular hypertrophy. Left ventricular diastolic function could not be evaluated.  2. Right ventricular systolic function is mildly reduced. The right ventricular size is mildly enlarged. Mildly increased right ventricular wall thickness.  3. Left atrial size was moderately dilated.  4. Right atrial size was moderately dilated.  5. The mitral valve is degenerative. Trivial mitral valve regurgitation.  6. The aortic valve is calcified. Aortic valve regurgitation not assessed.  7. Pulmonic valve regurgitation not assessed. 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 FINDINGS  Left Ventricle: Left ventricular ejection fraction, by estimation, is 20 to 25%. The left ventricle has severely decreased function. The left ventricle demonstrates global hypokinesis. The left ventricular internal cavity size was severely dilated. There is mild concentric left ventricular hypertrophy. Left ventricular diastolic function  could not be evaluated. Right Ventricle: The right ventricular size is mildly enlarged. Mildly increased right ventricular wall thickness. Right ventricular systolic function is mildly reduced. Left Atrium: Left atrial size was moderately dilated. Right Atrium: Right atrial size was moderately dilated. Pericardium: The pericardium was not well visualized. Mitral Valve: The mitral valve is degenerative in appearance. Trivial mitral valve regurgitation. Tricuspid Valve: The tricuspid valve is grossly normal. Tricuspid valve regurgitation is trivial. Aortic Valve: The aortic valve is calcified. Aortic valve regurgitation not assessed. Aortic valve mean gradient measures 2.0 mmHg. Aortic valve peak gradient measures 5.4 mmHg. Aortic valve area, by VTI measures 2.00 cm. Pulmonic Valve: The pulmonic valve was not assessed. Pulmonic valve regurgitation not assessed. Aorta: The aortic root, ascending aorta and aortic arch are all structurally normal, with no evidence of dilitation or obstruction. IAS/Shunts: The interatrial septum was not assessed.  LEFT VENTRICLE PLAX 2D LVIDd:         5.00 cm      Diastology LVIDs:         4.50 cm      LV e' medial:    4.90 cm/s LV PW:         0.80 cm      LV E/e' medial:  12.7 LV IVS:        0.90 cm      LV e' lateral:   4.68 cm/s LVOT diam:     2.00 cm      LV E/e' lateral: 13.3 LV SV:         42 LV SV Index:   22 LVOT Area:     3.14 cm  LV Volumes (MOD) LV vol d, MOD A2C: 120.0 ml LV vol d, MOD A4C: 170.0 ml LV vol s, MOD A2C: 106.0 ml  LV vol s, MOD A4C: 125.0 ml LV SV MOD A2C:     14.0 ml LV SV MOD A4C:     170.0 ml LV SV MOD BP:      29.1 ml RIGHT VENTRICLE RV S prime:     15.30 cm/s TAPSE (M-mode): 4.2 cm LEFT ATRIUM             Index       RIGHT ATRIUM           Index LA diam:        4.00 cm 2.06 cm/m  RA Area:     14.10 cm LA Vol (A2C):   63.1 ml 32.54 ml/m RA Volume:   36.70 ml  18.93 ml/m LA Vol (A4C):   65.4 ml 33.73 ml/m LA Biplane Vol: 69.1 ml 35.64 ml/m  AORTIC VALVE  AV Area (Vmax):    1.86 cm AV Area (Vmean):   2.05 cm AV Area (VTI):     2.00 cm AV Vmax:           116.00 cm/s AV Vmean:          68.400 cm/s AV VTI:            0.211 m AV Peak Grad:      5.4 mmHg AV Mean Grad:      2.0 mmHg LVOT Vmax:         68.50 cm/s LVOT Vmean:        44.600 cm/s LVOT VTI:          0.134 m LVOT/AV VTI ratio: 0.64 MITRAL VALVE               TRICUSPID VALVE MV Area (PHT): 6.37 cm    TR Peak grad:   12.4 mmHg MV Decel Time: 119 msec    TR Vmax:        176.00 cm/s MV E velocity: 62.10 cm/s MV A velocity: 96.00 cm/s  SHUNTS MV E/A ratio:  0.65        Systemic VTI:  0.13 m                            Systemic Diam: 2.00 cm Shaukat Khan Electronically signed by Shaukat Khan Signature Date/Time: 05/03/2021/10:59:02 AM    Final      ASSESSMENT: 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  PLAN:    1.  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  Case discussed with surgery and cardiology.  Patient also had consultation with plastic surgery.  It was decided that it was too dangerous to undergo reconstruction and patient instead underwent lobectomy with axillary node dissection on March 15, 2021. CT scan on April 18, 2021 and nuclear med bone scan on May 01, 2021 reviewed independently did not reveal any metastatic disease.  Case previously discussed with Dr. Dent at [facility] and recommendation was to proceed with Taxotere, Cytoxan, and Herceptin and check BNP and cardiac enzymes with each cycle and monitor echo after every 2 cycles.  Would consider adding Perjeta after several cycles if cardiac status remained stable.  Despite medical recommendation, patient has adamantly refused any adjuvant chemotherapy given her recent cardiac ejection fraction of 20 to 25%.  She expressed understanding that not undergoing adjuvant  chemotherapy increases her risk of recurrence.  She had   consultation with radiation oncology and plans to proceed with adjuvant XRT.  No further intervention is needed.  Return to clinic in approximately 3 months at the conclusion of her XRT for further evaluation.  Continue letrozole as prescribed. 2.  Cardiomyopathy: Patient's most recent cardiac echo on May 03, 2021 reported an ejection fraction of 20 to 25%.  I spent a total of 30 minutes reviewing chart data, face-to-face evaluation with the patient, counseling and coordination of care as detailed above.    Patient expressed understanding and was in agreement with this plan. She also understands that She can call clinic at any time with any questions, concerns, or complaints.   Cancer Staging Carcinoma of breast, female, left (HCC) Staging form: Breast, AJCC 8th Edition - Pathologic stage from 03/25/2021: Stage IIIA (pT2, pN3a, cM0, G2, ER+, PR+, HER2+) - Signed by Finnegan, Timothy J, MD on 03/25/2021 Histologic grading system: 3 grade system   Timothy J Finnegan, MD   05/31/2021 12:06 PM     

## 2021-05-29 NOTE — Progress Notes (Signed)
Pt and husband concerned about chemo with ejection fraction of 20%. Pt also endorses tingling under arm that coincides with recent lumpectomy

## 2021-05-30 ENCOUNTER — Ambulatory Visit
Admission: RE | Admit: 2021-05-30 | Discharge: 2021-05-30 | Disposition: A | Discharge status: Home / Self Care | Payer: 59 | Source: Ambulatory Visit | Attending: Radiation Oncology | Admitting: Radiation Oncology

## 2021-05-30 DIAGNOSIS — Z170 Estrogen receptor positive status [ER+]: Secondary | ICD-10-CM | POA: Insufficient documentation

## 2021-05-30 DIAGNOSIS — C50912 Malignant neoplasm of unspecified site of left female breast: Secondary | ICD-10-CM | POA: Insufficient documentation

## 2021-05-30 DIAGNOSIS — C773 Secondary and unspecified malignant neoplasm of axilla and upper limb lymph nodes: Secondary | ICD-10-CM | POA: Diagnosis not present

## 2021-05-31 ENCOUNTER — Other Ambulatory Visit: Payer: Self-pay | Admitting: *Deleted

## 2021-05-31 DIAGNOSIS — C50912 Malignant neoplasm of unspecified site of left female breast: Secondary | ICD-10-CM

## 2021-06-05 DIAGNOSIS — Z170 Estrogen receptor positive status [ER+]: Secondary | ICD-10-CM | POA: Insufficient documentation

## 2021-06-05 DIAGNOSIS — I972 Postmastectomy lymphedema syndrome: Secondary | ICD-10-CM | POA: Diagnosis present

## 2021-06-05 DIAGNOSIS — C50912 Malignant neoplasm of unspecified site of left female breast: Secondary | ICD-10-CM | POA: Insufficient documentation

## 2021-06-05 DIAGNOSIS — C773 Secondary and unspecified malignant neoplasm of axilla and upper limb lymph nodes: Secondary | ICD-10-CM | POA: Insufficient documentation

## 2021-06-06 ENCOUNTER — Ambulatory Visit: Admission: RE | Admit: 2021-06-06 | Payer: 59 | Source: Ambulatory Visit

## 2021-06-06 DIAGNOSIS — I972 Postmastectomy lymphedema syndrome: Secondary | ICD-10-CM | POA: Diagnosis not present

## 2021-06-10 ENCOUNTER — Ambulatory Visit
Admission: RE | Admit: 2021-06-10 | Discharge: 2021-06-10 | Disposition: A | Discharge status: Home / Self Care | Payer: 59 | Source: Ambulatory Visit | Attending: Radiation Oncology | Admitting: Radiation Oncology

## 2021-06-10 DIAGNOSIS — I972 Postmastectomy lymphedema syndrome: Secondary | ICD-10-CM | POA: Diagnosis not present

## 2021-06-11 ENCOUNTER — Ambulatory Visit
Admission: RE | Admit: 2021-06-11 | Discharge: 2021-06-11 | Disposition: A | Discharge status: Home / Self Care | Payer: 59 | Source: Ambulatory Visit | Attending: Radiation Oncology | Admitting: Radiation Oncology

## 2021-06-11 DIAGNOSIS — I972 Postmastectomy lymphedema syndrome: Secondary | ICD-10-CM | POA: Diagnosis not present

## 2021-06-12 ENCOUNTER — Ambulatory Visit
Admission: RE | Admit: 2021-06-12 | Discharge: 2021-06-12 | Disposition: A | Discharge status: Home / Self Care | Payer: 59 | Source: Ambulatory Visit | Attending: Radiation Oncology | Admitting: Radiation Oncology

## 2021-06-12 DIAGNOSIS — I972 Postmastectomy lymphedema syndrome: Secondary | ICD-10-CM | POA: Diagnosis not present

## 2021-06-13 ENCOUNTER — Ambulatory Visit
Admission: RE | Admit: 2021-06-13 | Discharge: 2021-06-13 | Disposition: A | Discharge status: Home / Self Care | Payer: 59 | Source: Ambulatory Visit | Attending: Radiation Oncology | Admitting: Radiation Oncology

## 2021-06-13 DIAGNOSIS — I972 Postmastectomy lymphedema syndrome: Secondary | ICD-10-CM | POA: Diagnosis not present

## 2021-06-14 ENCOUNTER — Ambulatory Visit
Admission: RE | Admit: 2021-06-14 | Discharge: 2021-06-14 | Disposition: A | Discharge status: Home / Self Care | Payer: 59 | Source: Ambulatory Visit | Attending: Radiation Oncology | Admitting: Radiation Oncology

## 2021-06-14 DIAGNOSIS — I972 Postmastectomy lymphedema syndrome: Secondary | ICD-10-CM | POA: Diagnosis not present

## 2021-06-17 ENCOUNTER — Ambulatory Visit
Admission: RE | Admit: 2021-06-17 | Discharge: 2021-06-17 | Disposition: A | Discharge status: Home / Self Care | Payer: 59 | Source: Ambulatory Visit | Attending: Radiation Oncology | Admitting: Radiation Oncology

## 2021-06-17 DIAGNOSIS — I972 Postmastectomy lymphedema syndrome: Secondary | ICD-10-CM | POA: Diagnosis not present

## 2021-06-18 ENCOUNTER — Ambulatory Visit
Admission: RE | Admit: 2021-06-18 | Discharge: 2021-06-18 | Disposition: A | Discharge status: Home / Self Care | Payer: 59 | Source: Ambulatory Visit | Attending: Radiation Oncology | Admitting: Radiation Oncology

## 2021-06-18 DIAGNOSIS — I972 Postmastectomy lymphedema syndrome: Secondary | ICD-10-CM | POA: Diagnosis not present

## 2021-06-19 ENCOUNTER — Ambulatory Visit
Admission: RE | Admit: 2021-06-19 | Discharge: 2021-06-19 | Disposition: A | Discharge status: Home / Self Care | Payer: 59 | Source: Ambulatory Visit | Attending: Radiation Oncology | Admitting: Radiation Oncology

## 2021-06-19 DIAGNOSIS — I972 Postmastectomy lymphedema syndrome: Secondary | ICD-10-CM | POA: Diagnosis not present

## 2021-06-20 ENCOUNTER — Other Ambulatory Visit: Payer: Self-pay | Admitting: General Surgery

## 2021-06-20 ENCOUNTER — Ambulatory Visit
Admission: RE | Admit: 2021-06-20 | Discharge: 2021-06-20 | Disposition: A | Discharge status: Home / Self Care | Payer: 59 | Source: Ambulatory Visit | Attending: Radiation Oncology | Admitting: Radiation Oncology

## 2021-06-20 DIAGNOSIS — I972 Postmastectomy lymphedema syndrome: Secondary | ICD-10-CM | POA: Diagnosis not present

## 2021-06-20 DIAGNOSIS — I890 Lymphedema, not elsewhere classified: Secondary | ICD-10-CM

## 2021-06-21 ENCOUNTER — Ambulatory Visit
Admission: RE | Admit: 2021-06-21 | Discharge: 2021-06-21 | Disposition: A | Discharge status: Home / Self Care | Payer: 59 | Source: Ambulatory Visit | Attending: Radiation Oncology | Admitting: Radiation Oncology

## 2021-06-21 DIAGNOSIS — I972 Postmastectomy lymphedema syndrome: Secondary | ICD-10-CM | POA: Diagnosis not present

## 2021-06-24 ENCOUNTER — Inpatient Hospital Stay: Payer: 59

## 2021-06-24 ENCOUNTER — Other Ambulatory Visit: Payer: Self-pay

## 2021-06-24 ENCOUNTER — Other Ambulatory Visit: Payer: 59

## 2021-06-24 ENCOUNTER — Ambulatory Visit
Admission: RE | Admit: 2021-06-24 | Discharge: 2021-06-24 | Disposition: A | Discharge status: Home / Self Care | Payer: 59 | Source: Ambulatory Visit | Attending: Radiation Oncology | Admitting: Radiation Oncology

## 2021-06-24 DIAGNOSIS — C773 Secondary and unspecified malignant neoplasm of axilla and upper limb lymph nodes: Secondary | ICD-10-CM | POA: Insufficient documentation

## 2021-06-24 DIAGNOSIS — I972 Postmastectomy lymphedema syndrome: Secondary | ICD-10-CM | POA: Diagnosis not present

## 2021-06-24 DIAGNOSIS — Z510 Encounter for antineoplastic radiation therapy: Secondary | ICD-10-CM | POA: Insufficient documentation

## 2021-06-24 DIAGNOSIS — C50912 Malignant neoplasm of unspecified site of left female breast: Secondary | ICD-10-CM | POA: Insufficient documentation

## 2021-06-24 LAB — CBC
HCT: 40.4 % (ref 36.0–46.0)
Hemoglobin: 13.7 g/dL (ref 12.0–15.0)
MCH: 34 pg (ref 26.0–34.0)
MCHC: 33.9 g/dL (ref 30.0–36.0)
MCV: 100.2 fL — ABNORMAL HIGH (ref 80.0–100.0)
Platelets: 244 10*3/uL (ref 150–400)
RBC: 4.03 MIL/uL (ref 3.87–5.11)
RDW: 13.1 % (ref 11.5–15.5)
WBC: 6.9 10*3/uL (ref 4.0–10.5)
nRBC: 0 % (ref 0.0–0.2)

## 2021-06-25 ENCOUNTER — Ambulatory Visit
Admission: RE | Admit: 2021-06-25 | Discharge: 2021-06-25 | Disposition: A | Discharge status: Home / Self Care | Payer: 59 | Source: Ambulatory Visit | Attending: Radiation Oncology | Admitting: Radiation Oncology

## 2021-06-25 ENCOUNTER — Other Ambulatory Visit: Payer: 59

## 2021-06-25 DIAGNOSIS — I972 Postmastectomy lymphedema syndrome: Secondary | ICD-10-CM | POA: Diagnosis not present

## 2021-06-26 ENCOUNTER — Other Ambulatory Visit: Payer: 59

## 2021-06-26 ENCOUNTER — Encounter: Payer: Self-pay | Admitting: Occupational Therapy

## 2021-06-26 ENCOUNTER — Other Ambulatory Visit: Payer: Self-pay

## 2021-06-26 ENCOUNTER — Ambulatory Visit: Payer: 59 | Attending: General Surgery | Admitting: Occupational Therapy

## 2021-06-26 ENCOUNTER — Ambulatory Visit
Admission: RE | Admit: 2021-06-26 | Discharge: 2021-06-26 | Disposition: A | Discharge status: Home / Self Care | Payer: 59 | Source: Ambulatory Visit | Attending: Radiation Oncology | Admitting: Radiation Oncology

## 2021-06-26 DIAGNOSIS — I972 Postmastectomy lymphedema syndrome: Secondary | ICD-10-CM | POA: Diagnosis not present

## 2021-06-27 ENCOUNTER — Other Ambulatory Visit: Payer: Self-pay | Admitting: *Deleted

## 2021-06-27 ENCOUNTER — Ambulatory Visit
Admission: RE | Admit: 2021-06-27 | Discharge: 2021-06-27 | Disposition: A | Discharge status: Home / Self Care | Payer: 59 | Source: Ambulatory Visit | Attending: Radiation Oncology | Admitting: Radiation Oncology

## 2021-06-27 DIAGNOSIS — I972 Postmastectomy lymphedema syndrome: Secondary | ICD-10-CM | POA: Diagnosis not present

## 2021-06-28 ENCOUNTER — Ambulatory Visit
Admission: RE | Admit: 2021-06-28 | Discharge: 2021-06-28 | Disposition: A | Discharge status: Home / Self Care | Payer: 59 | Source: Ambulatory Visit | Attending: Radiation Oncology | Admitting: Radiation Oncology

## 2021-06-28 DIAGNOSIS — I972 Postmastectomy lymphedema syndrome: Secondary | ICD-10-CM | POA: Diagnosis not present

## 2021-06-28 NOTE — Therapy (Signed)
[place] [facility] [address] [place], [place], [address] Phone: [phone]   Fax:  [phone]  Occupational Therapy Evaluation  Patient Details  Name: Erika Rios MRN: [id number] Date of Birth: 04-12-1961 Referring Provider (OT): Robert Bellow, MD   Encounter Date: 06/26/2021   OT End of Session - 06/28/21 1119     Visit Number 1    Number of Visits 36    Date for OT Re-Evaluation 09/25/21    Activity Tolerance Patient tolerated treatment well;No increased pain    Behavior During Therapy [facility] for tasks assessed/performed             Past Medical History:  Diagnosis Date   AICD (automatic cardioverter/defibrillator) present 10/22/2018   CAD (coronary artery disease)    CHF (congestive heart failure) (HCC)    COPD (chronic obstructive pulmonary disease) (HCC)    DOE (dyspnea on exertion)    Dyspnea    Family history of adverse reaction to anesthesia    (+) 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   GERD (gastroesophageal reflux disease)    History of 2019 novel coronavirus disease (COVID-19) 04/23/2020   HLD (hyperlipidemia)    HTN (hypertension)    Invasive carcinoma of breast ([place]) 01/23/2021   LEFT invasive mammary carcinoma; ER/PR (+), HER2/neu (+)   Ischemic cardiomyopathy    s/p AICD placement   ST elevation myocardial infarction (STEMI) of anterior wall, initial episode of care ([place]) 02/24/2016   DES x 1 to pLAD   Statin intolerance    T2DM (type 2 diabetes mellitus) ([place])    Tobacco abuse    quit smoking 2017    Past Surgical History:  Procedure Laterality Date   BREAST BIOPSY Right 12/10/2016   neg bx FIBROADENOMATOUS CHANGES WITH STROMAL AND LUMINAL CALCIFICATIONS. NEGATIVE   BREAST LUMPECTOMY Left 03/15/2021   lumpectomy with NL bracketing   BREAST LUMPECTOMY WITH NEEDLE LOCALIZATION Left 03/15/2021   Procedure: BREAST LUMPECTOMY WITH NEEDLE LOCALIZATION;  Surgeon: Robert Bellow, MD;   Location: [facility];  Service: General;  Laterality: Left;   CARDIAC CATHETERIZATION N/A 02/24/2016   Procedure: Left Heart Cath and Coronary Angiography;  Surgeon: Troy Sine, MD;  Location: [place] [facility];  Service: Cardiovascular;  Laterality: N/A;   CARDIAC CATHETERIZATION N/A 02/24/2016   Procedure: Coronary Stent Intervention (3.2518 mm Xience Alpine DES stent);  Surgeon: Troy Sine, MD;  Location: [place] [facility];  Service: Cardiovascular   CHOLECYSTECTOMY N/A 02/02/2019   Procedure: LAPAROSCOPIC CHOLECYSTECTOMY - LATEX ALLERGY;  Surgeon: Olean Ree, MD;  Location: [facility];  Service: General;  Laterality: N/A;   IMPLANTABLE CARDIOVERTER DEFIBRILLATOR (ICD) GENERATOR CHANGE Left 10/22/2018   Procedure: ICD GENERATOR INSERTION;  Surgeon: Marzetta Board, MD;  Location: [facility];  Service: Cardiovascular;  Laterality: Left;   INCISION AND DRAINAGE ABSCESS Left 04/09/2021   Procedure: INCISION AND DRAINAGE ABSCESS;  Surgeon: Robert Bellow, MD;  Location: [facility];  Service: General;  Laterality: Left;  breast   IR BILIARY DRAIN PLACEMENT WITH CHOLANGIOGRAM      There were no vitals filed for this visit.   Subjective Assessment - 06/27/21 1458     Subjective  Erika Rios is referred to Occupational Therapy for evaluation and treatment of LUE/LUQ post-mastectomy lymphedema. Dr Bary Castilla measured a 2.5 cm difference in UE and noted sleeve candidate in his referral. Pt reports she is unclear why she is here. She reports she hasnt noticed swelling in  her arm and hasn't experienced pain or other discomfort associated with lymphedema. Pt denies any known family history of limb swelling. We reviewed lymphedema diagnosis, etiologies and discussed importance of limiting risk. Pt's goal for today's session is to "get a sleeve and know about lymphedema".    Pertinent History Stage IIIa invasive mammary carcinoma, tripple +. S/P wide excision and ALND, 13/10+. S/p incision and  drainage of L breast abcess/ seroma  04/09/21. Currently undergoing XRT to whole L breast and periferal LN. 2019 novel coronavirus, HTN,  Cardiomyopathy, MI, Ejection fraction 20-25%, Tobacco abuse. T2DM.    Limitations decreased endurance, obesity, Onset LUE/ LUQ sweling s/p lumpectomy and SLNB (13/10+) 03/2021.    Special Tests -Stemmer; [place] FOTO  98/100    Patient Stated Goals get sleeve and learn about lymphedema    Currently in Pain? No/denies    Pain Location Arm    Pain Orientation Left    Pain Onset --   5 years              [facility] OT Assessment - 06/28/21 1103       Assessment   Medical Diagnosis Stage I, LUE    Referring Provider (OT) Robert Bellow, MD    Onset Date/Surgical Date --   03/2021   Hand Dominance Left    Prior Therapy no      Precautions   Precautions Other (comment)   LE PRECAUTIONS: CARDIAC     Restrictions   Weight Bearing Restrictions No      Balance Screen   Has the patient fallen in the past 6 months No      Home  Environment   Family/patient expects to be discharged to: Private residence    Living Arrangements Spouse/significant other    Type of [place] Two level    Bathroom Shower/Tub [place]      Prior Function   Level of [employer], cooking, have an airplane, motorcycles      IADL   Prior Level of [place] independently for [employer]   setaed rest breaks when  when > 30 min   Prior Level of Function Light Housekeeping I    Light Housekeeping Performs light daily tasks but cannot maintain acceptable level of cleanliness;Needs help with all home maintenance tasks    Prior Level of Function Meal Prep I    Meal Prep Plans, prepares and serves adequate meals independently   requires seated rest breaks for extended standing   Prior Level of Function Community  Mobility I    Programmer, applications own Art therapist Status Independent      Vision - History   Baseline Vision Wears glasses all the time      Activity Tolerance   Activity Tolerance Tolerates 30 min activity with multiple rests      Cognition   Overall Cognitive Status Within Functional Limits for tasks assessed      Observation/Other Assessments   Observations mild LUE swelling distributed from axilla  to medial fa over flexor muscle bundle. hand swelling very slight. Stemmer sign is negative at  MPs. Skin well hydrated and flexible. No hypersensitivity. No palpable cording , but need further assessment. Mild swelling lateral L trunck and breast within  rad field    Outcome Measures BUE limb volumetrics TBA at firt Rx visit      Posture/Postural Control   Posture/Postural Control No significant limitations      Sensation   Light Touch Appears Intact      Coordination   Gross Motor Movements are Fluid and Coordinated Yes    Fine Motor Movements are Fluid and Coordinated Yes      ROM / Strength   AROM / PROM / Strength AROM      AROM   Overall AROM  Within functional limits for tasks performed    AROM Assessment Site Elbow;Forearm;Wrist;Finger;Shoulder              LYMPHEDEMA/ONCOLOGY QUESTIONNAIRE - 06/28/21 1116       Surgeries   Mastectomy Date --   July 2022   Lumpectomy Date --   same   Number Lymph Nodes Removed --   13/10+     Treatment   Active Radiation Treatment Yes   3/8 weeks complete   Body Site whole breast and periferal LN      What other symptoms do you have   Stemmer Sign No      Lymphedema Stage   Stage STAGE 1 SPONTANEOUSLY REVERSIBLE                    OT Treatments/Exercises (OP) - 06/28/21 1116       Transfers   Transfers Sit to Stand    Sit to Stand 6: Modified independent (Device/Increase time)   armrests     ADLs   Overall ADLs I      Manual Therapy   Manual Therapy Edema management                     OT Education - 06/28/21 1118     Education Details Provided Pt education regarding lymphatic structure and function, etiology, onset patterns and stages of progression. Taught interaction between blood circulatory system and lymphatic circulation.Discussed  impact of gravity and obesity on lymphatic function. Outlined Complete Decongestive Therapy (CDT)  as standard of care and provided in depth information regarding 4 primary components of both Intensive and Self Management Phases, including Manual Lymph Drainage (MLD), compression wrapping and garments, skin care, and therapeutic exercise.   Pilar Plate discussion of high burden of care and contributing impact of existing co morbidities. We discussed  Importance of daily, ongoing LE self-care essential to retaining clinical gains and limiting progression.    Person(s) Educated Patient    Methods Explanation;Demonstration;Handout    Comprehension Verbalized understanding;Returned demonstration                 OT Long Term Goals - 06/28/21 1135       OT LONG TERM GOAL #1   Title Given this patient's Intake score of 98/100 on the functional outcomes FOTO tool, patient will experience an increase in function of 2 points or higher, to increase functional independence performance of basic and instrumental ADLs, including lymphedema self-care.    Baseline 98/100    Time 12    Period Weeks    Status New    Target Date 09/26/21      OT LONG TERM GOAL #2   Title Pt will demonstrate understanding of lymphedema prevention strategies by discussing 5 precautions using printed reference (modified assistance) to reduce risk of progression and to limit infection risk.    Baseline max A    Time 4  Period Days    Status New    Target Date --   4th OT Rx visit     OT LONG TERM GOAL #3   Title Pt will achieve at least a 10% limb volume reduction in LUE to return limb to normal size and shape,  to limit lymphedema  progression, to limit infection risk, and to improve  ADLs performance.    Baseline dependent    Time 12    Period Weeks    Status New    Target Date 09/26/21      OT LONG TERM GOAL #4   Title Pt will achieve and sustain a least 85% compliance with all LE self-care home program components throughout Intensive Phase CDTto limit LE progression.    Baseline dependent    Time 12    Period Weeks    Status New    Target Date 09/26/21      OT LONG TERM GOAL #5   Title Pt will be able to correctly don/ doff and position compression arm sleeve using assistive device/s ( modified independence) to limit LE progression.    Baseline Max A    Time 12    Period Weeks    Status New    Target Date 09/26/21                   Plan - 06/28/21 1120     Clinical Impression Statement Erika Rios is a 60 y o female presenting with mild, stage I, LUE/LUQ post-mastectomy lymphedema syndrome. Non-pitting swelling is observed at L breast, lateral trunk and axilla within radiation field and in LUE extending from axilla to distal volar forearm. Stemmer sign is negative at the MPs. Skin is flexible and well hydrated. Tissue density is increased throughout the L arm and forearm. Pt denies pain and other sensory symptoms often associated with lymphedema. Erika Rios will benefit from skilled Occupational Therapy for modified Intensive Phase CDT to reduce dominant LUE and L trunkal swelling to limit progression of chronic lymphedema. Manual lymphatic drainage (MLD) will help to offset the ongoing  increased lymphatic load s/p surgeries, breast abcess and ongoing XRT. With MLD we will utilze adjacent lymphatic watersheds via posterior axillary and axillary-inguinal anastamoses while avoiding strokes to fragile skin within the radiation field. Initiall Pt will be fit with an off-the-shelf compression arm sleeve instead of wrapping to avoid burden of care. If  volumetric reduction is not achieved to WNL we'll  consider gradient compression wraps. Pt and spouse will be taught simple self-MLD and skin care, as well as lymphatic pumping therex. Without skilled OT for lymphedema management, this chronic, progressive condition is likely to worsen and further functional decline is likely.    OT Occupational Profile and History Comprehensive Assessment- Review of records and extensive additional review of physical, cognitive, psychosocial history related to current functional performance    Occupational performance deficits (Please refer to evaluation for details): ADL's;IADL's;Work;Leisure;Social Participation    Body Structure / Function / Physical Skills ADL;Skin integrity;UE functional use;Decreased knowledge of precautions;Decreased knowledge of use of DME;Edema    Rehab Potential Good    Clinical Decision Making Several treatment options, min-mod task modification necessary    Comorbidities Affecting Occupational Performance: Presence of comorbidities impacting occupational performance    Comorbidities impacting occupational performance description: Cardiomyopathy,COPD, CAD, Hx MI, obesity, tobacco abuse    Modification or Assistance to Complete Evaluation  Min-Moderate modification of tasks or assist with assess necessary to complete eval  OT Frequency 2x / week    OT Duration 12 weeks   and PRN   OT Treatment/Interventions Self-care/ADL training;Therapeutic exercise;Manual Therapy;Coping strategies training;Therapeutic activities;Manual lymph drainage;Energy conservation;DME and/or AE instruction;Compression bandaging;Patient/family education;Other (comment)    Plan CDT: manual lymphatic drainage (MLD)  using defined anastamoses towards adjacent lymphatic watersheds to offload radiation field. No strokes in radiated field during and at least 8 weeks after XRT. Compression: Comparative limb volumetrics TBA at next visit to datermine wether compression wraps are needed, or if Pt can progress to garment  directly. Therex for lymphatic pumping without overloading affected quadrant, and skin care with low ph castor oil to non-radiated skin to ensure optimal hydration and flexibility for MLD    OT Home Exercise Plan fit with appropriate compression arm sleeve    Recommended Other Services teach spouse simple self MLD if interested    Consulted and Agree with Plan of Care Patient             Patient will benefit from skilled therapeutic intervention in order to improve the following deficits and impairments:   Body Structure / Function / Physical Skills: ADL, Skin integrity, UE functional use, Decreased knowledge of precautions, Decreased knowledge of use of DME, Edema       Visit Diagnosis: Postmastectomy lymphedema syndrome - Plan: Ot plan of care cert/re-cert    Problem List Patient Active Problem List   Diagnosis Date Noted   Carcinoma of breast, female, left ([place]) 01/24/2021   Liver abscess 11/26/2018   Acute cholecystitis 10/28/2018   AICD (automatic cardioverter/defibrillator) present 86/38/1771   Chronic systolic heart failure ([place]) 10/22/2018   COPD, mild ([place]) 06/02/2018   SOB (shortness of breath) on exertion 02/17/2018   Diverticulosis of sigmoid colon 12/01/2017   HLD (hyperlipidemia) 10/15/2017   Low vitamin B12 level 06/13/2016   Cardiomyopathy, ischemic 04/07/2016   Unstable angina ([place]) 04/01/2016   Coronary artery disease involving native coronary artery of native heart without angina pectoris 03/13/2016   Status post coronary artery stent placement    STEMI (ST elevation myocardial infarction) ([place]) 02/24/2016   Tobacco abuse    ST elevation myocardial infarction (STEMI) of anterior wall, initial episode of care [facility])    ST elevation myocardial infarction involving left anterior descending (LAD) coronary artery ([place])    Andrey Spearman, MS, OTR/L, CLT-LANA 06/28/21 11:48 AM   [place], [place], [address] Phone: [phone]   Fax:  [phone]  Name: Erika Rios MRN: [id number] Date of Birth: 1961-06-17

## 2021-06-28 NOTE — Patient Instructions (Signed)
Lymphedema Precautions   Dopler study required prior to participating in Complete Decongestive Therapy (CDT) for lymphedema (LE) care  to rule out DVT   If you experience atypical shortness of breath, or notice any signs /symptoms of skin infection (aka cellulitis) remove all compression wraps/ garments, discontinue manual lymphatic drainage (MLD), and report symptoms to your physician immediately. Discontinue MLD and compression for 72 hours after you take your first oral antibiotic so not to spread the infection.   Lymphedema Self- Care Instructions  1. EXERCISE: Perform lymphatic pumping there ex 2 x a day. While wearing your compression wraps or garments. Perform 10 reps of each exercise bilaterally and be sure to perform them in order. Don;t skip around!  OMIT PARTIAL SIT UP  2. MLD: Perform simple self-Manual Lymphatic Drainage (MLD) at least once a day as directed.  3. WRAPS: Compression wraps are to be worn 23 hrs/ 7 days/wk during Intensive Phase of Complete Decongestive Therapy (CDT).Building tolerance may take time and practice, so don't get discouraged. If bandages begin to feel tight during periods of inactivity and/or during the night, try performing your exercises to loosen them.   4. GARMENTS: During Management Phase CDT your compression garments are to be worn during waking hours when active. Do NOT sleep in your garments!!   5. PUT YOUR FEET UP! Elevate your feet and legs and feet to the level of your heart whenever you are sitting down.   6. SKIN: Carefully monitor skin condition and perform impeccable hygiene daily. Bathe skin with mild soap and water and apply low pH lotion (aka Eucerin ) to improve hydration and limit infection risk.

## 2021-07-01 ENCOUNTER — Ambulatory Visit
Admission: RE | Admit: 2021-07-01 | Discharge: 2021-07-01 | Disposition: A | Discharge status: Home / Self Care | Payer: 59 | Source: Ambulatory Visit | Attending: Radiation Oncology | Admitting: Radiation Oncology

## 2021-07-01 DIAGNOSIS — I972 Postmastectomy lymphedema syndrome: Secondary | ICD-10-CM | POA: Diagnosis not present

## 2021-07-02 ENCOUNTER — Other Ambulatory Visit: Payer: Self-pay

## 2021-07-02 ENCOUNTER — Ambulatory Visit: Payer: 59 | Admitting: Occupational Therapy

## 2021-07-02 ENCOUNTER — Ambulatory Visit
Admission: RE | Admit: 2021-07-02 | Discharge: 2021-07-02 | Disposition: A | Discharge status: Home / Self Care | Payer: 59 | Source: Ambulatory Visit | Attending: Radiation Oncology | Admitting: Radiation Oncology

## 2021-07-02 DIAGNOSIS — C50912 Malignant neoplasm of unspecified site of left female breast: Secondary | ICD-10-CM | POA: Insufficient documentation

## 2021-07-02 DIAGNOSIS — I972 Postmastectomy lymphedema syndrome: Secondary | ICD-10-CM | POA: Insufficient documentation

## 2021-07-02 DIAGNOSIS — C773 Secondary and unspecified malignant neoplasm of axilla and upper limb lymph nodes: Secondary | ICD-10-CM | POA: Insufficient documentation

## 2021-07-02 DIAGNOSIS — Z510 Encounter for antineoplastic radiation therapy: Secondary | ICD-10-CM | POA: Diagnosis present

## 2021-07-02 DIAGNOSIS — C50412 Malignant neoplasm of upper-outer quadrant of left female breast: Secondary | ICD-10-CM | POA: Diagnosis not present

## 2021-07-02 DIAGNOSIS — Z170 Estrogen receptor positive status [ER+]: Secondary | ICD-10-CM | POA: Diagnosis not present

## 2021-07-02 NOTE — Therapy (Signed)
[place] MAIN [facility] SERVICES [address] [place], [place], [address] Phone: [phone]   Fax:  [phone]  Occupational Therapy Treatment  Patient Details  Name: Erika Rios MRN: [id number] Date of Birth: Oct 03, 1960 Referring Provider (OT): Robert Bellow, MD   Encounter Date: 07/02/2021   OT End of Session - 07/02/21 1610     Visit Number 2    Number of Visits 36    Date for OT Re-Evaluation 09/25/21    OT Start Time 0105    OT Stop Time 0210    OT Time Calculation (min) 65 min    Activity Tolerance Patient tolerated treatment well;No increased pain    Behavior During Therapy [facility] for tasks assessed/performed             Past Medical History:  Diagnosis Date   AICD (automatic cardioverter/defibrillator) present 10/22/2018   CAD (coronary artery disease)    CHF (congestive heart failure) (HCC)    COPD (chronic obstructive pulmonary disease) (HCC)    DOE (dyspnea on exertion)    Dyspnea    Family history of adverse reaction to anesthesia    (+) 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   GERD (gastroesophageal reflux disease)    History of 2019 novel coronavirus disease (COVID-19) 04/23/2020   HLD (hyperlipidemia)    HTN (hypertension)    Invasive carcinoma of breast ([place]) 01/23/2021   LEFT invasive mammary carcinoma; ER/PR (+), HER2/neu (+)   Ischemic cardiomyopathy    s/p AICD placement   ST elevation myocardial infarction (STEMI) of anterior wall, initial episode of care ([place]) 02/24/2016   DES x 1 to pLAD   Statin intolerance    T2DM (type 2 diabetes mellitus) ([place])    Tobacco abuse    quit smoking 2017    Past Surgical History:  Procedure Laterality Date   BREAST BIOPSY Right 12/10/2016   neg bx FIBROADENOMATOUS CHANGES WITH STROMAL AND LUMINAL CALCIFICATIONS. NEGATIVE   BREAST LUMPECTOMY Left 03/15/2021   lumpectomy with NL bracketing   BREAST LUMPECTOMY WITH NEEDLE LOCALIZATION Left 03/15/2021   Procedure:  BREAST LUMPECTOMY WITH NEEDLE LOCALIZATION;  Surgeon: Robert Bellow, MD;  Location: [facility] ORS;  Service: General;  Laterality: Left;   CARDIAC CATHETERIZATION N/A 02/24/2016   Procedure: Left Heart Cath and Coronary Angiography;  Surgeon: Troy Sine, MD;  Location: [place] CV LAB;  Service: Cardiovascular;  Laterality: N/A;   CARDIAC CATHETERIZATION N/A 02/24/2016   Procedure: Coronary Stent Intervention (3.2518 mm Xience Alpine DES stent);  Surgeon: Troy Sine, MD;  Location: [place] CV LAB;  Service: Cardiovascular   CHOLECYSTECTOMY N/A 02/02/2019   Procedure: LAPAROSCOPIC CHOLECYSTECTOMY - LATEX ALLERGY;  Surgeon: Olean Ree, MD;  Location: [facility] ORS;  Service: General;  Laterality: N/A;   IMPLANTABLE CARDIOVERTER DEFIBRILLATOR (ICD) GENERATOR CHANGE Left 10/22/2018   Procedure: ICD GENERATOR INSERTION;  Surgeon: Marzetta Board, MD;  Location: [facility] ORS;  Service: Cardiovascular;  Laterality: Left;   INCISION AND DRAINAGE ABSCESS Left 04/09/2021   Procedure: INCISION AND DRAINAGE ABSCESS;  Surgeon: Robert Bellow, MD;  Location: [facility] ORS;  Service: General;  Laterality: Left;  breast   IR BILIARY DRAIN PLACEMENT WITH CHOLANGIOGRAM      There were no vitals filed for this visit.   Subjective Assessment - 07/02/21 1615     Subjective  Erika Rios presents for OT visit 1/36 to address LUE/LUQ post-mastectomy lymphedema. Pt denies LE related pain and fluctuation in swelling. Pt has no  new complaints since initial evaluation.    Pertinent History Stage IIIa invasive mammary carcinoma, tripple +. S/P wide excision and ALND, 13/10+. S/p incision and drainage of L breast abcess/ seroma  04/09/21. Currently undergoing XRT to whole L breast and periferal LN. 2019 novel coronavirus, HTN,  Cardiomyopathy, MI, Ejection fraction 20-25%, Tobacco abuse. T2DM.    Limitations decreased endurance, obesity, Onset LUE/ LUQ sweling s/p lumpectomy and SLNB (13/10+) 03/2021.    Special  Tests -Stemmer; [place]  98/100    Patient Stated Goals get sleeve and learn about lymphedema    Pain Onset --   5 years                LYMPHEDEMA/ONCOLOGY QUESTIONNAIRE - 07/02/21 1618       Lymphedema Assessments   Lymphedema Assessments Upper extremities      Right Upper Extremity Lymphedema   Other LUE full limb volume = 2802.2 ml.    Other Limb volume differential = .5%, L>R      Left Upper Extremity Lymphedema   Other RUE full limb volume = 2789.2 ml.                     OT Treatments/Exercises (OP) - 07/02/21 1617       ADLs   ADL Education Given Yes      Manual Therapy   Manual Therapy Edema management    Manual therapy comments comparative limb volumetrics, anatomical measurements for compression arm sleeve.                    OT Education - 07/02/21 1619     Education Details Continued Pt/ CG edu for lymphedema self care  and home program throughout session. Topics include multilayer, gradient compression wrapping, simple self-MLD, therapeutic lymphatic pumping exercises, skin/nail care, risk reduction factors and LE precautions, compression garments/recommendations and wear and care schedule and compression garment donning / doffing using assistive devices. All questions answered to the Pt's satisfaction, and Pt demonstrates understanding by report.    Person(s) Educated Patient    Methods Explanation;Demonstration;Handout    Comprehension Verbalized understanding;Returned demonstration                 OT Long Term Goals - 06/28/21 1135       OT LONG TERM GOAL #1   Title Given this patient's Intake score of 98/100 on the functional outcomes FOTO tool, patient will experience an increase in function of 2 points or higher, to increase functional independence performance of basic and instrumental ADLs, including lymphedema self-care.    Baseline 98/100    Time 12    Period Weeks    Status New    Target Date 09/26/21       OT LONG TERM GOAL #2   Title Pt will demonstrate understanding of lymphedema prevention strategies by discussing 5 precautions using printed reference (modified assistance) to reduce risk of progression and to limit infection risk.    Baseline max A    Time 4    Period Days    Status New    Target Date --   4th OT Rx visit     OT LONG TERM GOAL #3   Title Pt will achieve at least a 10% limb volume reduction in LUE to return limb to normal size and shape,  to limit lymphedema progression, to limit infection risk, and to improve  ADLs performance.    Baseline dependent    Time 12  Period Weeks    Status New    Target Date 09/26/21      OT LONG TERM GOAL #4   Title Pt will achieve and sustain a least 85% compliance with all LE self-care home program components throughout Intensive Phase CDTto limit LE progression.    Baseline dependent    Time 12    Period Weeks    Status New    Target Date 09/26/21      OT LONG TERM GOAL #5   Title Pt will be able to correctly don/ doff and position compression arm sleeve using assistive device/s ( modified independence) to limit LE progression.    Baseline Max A    Time 12    Period Weeks    Status New    Target Date 09/26/21                   Plan - 07/02/21 1610     Clinical Impression Statement Completed BUE comparative limb volumetrics which reveal a minimal .5% LIMB VOLUME DIFFERENTIAL WITH l DOMINANT UPPER EXTREMITY GREATER IN VOLUME THAN NON-DOMINANT RIGHT. wHAT VOLUMETRICS ALSO SHOW IS GREATER VOLUME FROM THE ELBOW TO AXILLA on the left than on the R. Performed anatomical measurements for compression arm sleeve and Pt's arm does not fit into a a standard off the shelf size. Faxed demographics and referral info after session to DME vendor to check on insurance benefits for custom garment. 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    OT  Occupational Profile and History Comprehensive Assessment- Review of records and extensive additional review of physical, cognitive, psychosocial history related to current functional performance    Occupational performance deficits (Please refer to evaluation for details): ADL's;IADL's;Work;Leisure;Social Participation    Body Structure / Function / Physical Skills ADL;Skin integrity;UE functional use;Decreased knowledge of precautions;Decreased knowledge of use of DME;Edema    Rehab Potential Good    Clinical Decision Making Several treatment options, min-mod task modification necessary    Comorbidities Affecting Occupational Performance: Presence of comorbidities impacting occupational performance    Comorbidities impacting occupational performance description: Cardiomyopathy,COPD, CAD, Hx MI, obesity, tobacco abuse    Modification or Assistance to Complete Evaluation  Min-Moderate modification of tasks or assist with assess necessary to complete eval    OT Frequency 2x / week    OT Duration 12 weeks   and PRN   OT Treatment/Interventions Self-care/ADL training;Therapeutic exercise;Manual Therapy;Coping strategies training;Therapeutic activities;Manual lymph drainage;Energy conservation;DME and/or AE instruction;Compression bandaging;Patient/family education;Other (comment)    Plan CDT: manual lymphatic drainage (MLD)  using defined anastamoses towards adjacent lymphatic watersheds to offload radiation field. No strokes in radiated field during and at least 8 weeks after XRT. Compression: Comparative limb volumetrics TBA at next visit to datermine wether compression wraps are needed, or if Pt can progress to garment directly. Therex for lymphatic pumping without overloading affected quadrant, and skin care with low ph castor oil to non-radiated skin to ensure optimal hydration and flexibility for MLD    OT Home Exercise Plan fit with appropriate compression arm sleeve    Recommended Other Services  teach spouse simple self MLD if interested    Consulted and Agree with Plan of Care Patient             Patient will benefit from skilled therapeutic intervention in order to improve the following deficits and impairments:   Body Structure / Function / Physical Skills: ADL, Skin  integrity, UE functional use, Decreased knowledge of precautions, Decreased knowledge of use of DME, Edema       Visit Diagnosis: Postmastectomy lymphedema syndrome    Problem List Patient Active Problem List   Diagnosis Date Noted   Carcinoma of breast, female, left ([place]) 01/24/2021   Liver abscess 11/26/2018   Acute cholecystitis 10/28/2018   AICD (automatic cardioverter/defibrillator) present 15/95/3967   Chronic systolic heart failure ([place]) 10/22/2018   COPD, mild ([place]) 06/02/2018   SOB (shortness of breath) on exertion 02/17/2018   Diverticulosis of sigmoid colon 12/01/2017   HLD (hyperlipidemia) 10/15/2017   Low vitamin B12 level 06/13/2016   Cardiomyopathy, ischemic 04/07/2016   Unstable angina ([place]) 04/01/2016   Coronary artery disease involving native coronary artery of native heart without angina pectoris 03/13/2016   Status post coronary artery stent placement    STEMI (ST elevation myocardial infarction) ([place]) 02/24/2016   Tobacco abuse    ST elevation myocardial infarction (STEMI) of anterior wall, initial episode of care [facility])    ST elevation myocardial infarction involving left anterior descending (LAD) coronary artery ([place])     Andrey Spearman, MS, OTR/L, CLT-LANA 07/02/21 4:21 PM   [place] [address] [place], [place], [address] Phone: [phone]   Fax:  [phone]  Name: Erika Rios MRN: [id number] Date of Birth: [birth year]

## 2021-07-03 ENCOUNTER — Ambulatory Visit: Payer: 59 | Admitting: Occupational Therapy

## 2021-07-03 ENCOUNTER — Ambulatory Visit
Admission: RE | Admit: 2021-07-03 | Discharge: 2021-07-03 | Disposition: A | Discharge status: Home / Self Care | Payer: 59 | Source: Ambulatory Visit | Attending: Radiation Oncology | Admitting: Radiation Oncology

## 2021-07-03 DIAGNOSIS — I972 Postmastectomy lymphedema syndrome: Secondary | ICD-10-CM

## 2021-07-03 DIAGNOSIS — Z510 Encounter for antineoplastic radiation therapy: Secondary | ICD-10-CM | POA: Diagnosis not present

## 2021-07-04 ENCOUNTER — Ambulatory Visit
Admission: RE | Admit: 2021-07-04 | Discharge: 2021-07-04 | Disposition: A | Discharge status: Home / Self Care | Payer: 59 | Source: Ambulatory Visit | Attending: Radiation Oncology | Admitting: Radiation Oncology

## 2021-07-04 ENCOUNTER — Ambulatory Visit: Payer: 59 | Admitting: Occupational Therapy

## 2021-07-04 DIAGNOSIS — Z510 Encounter for antineoplastic radiation therapy: Secondary | ICD-10-CM | POA: Diagnosis not present

## 2021-07-05 ENCOUNTER — Ambulatory Visit
Admission: RE | Admit: 2021-07-05 | Discharge: 2021-07-05 | Disposition: A | Discharge status: Home / Self Care | Payer: 59 | Source: Ambulatory Visit | Attending: Radiation Oncology | Admitting: Radiation Oncology

## 2021-07-05 DIAGNOSIS — Z510 Encounter for antineoplastic radiation therapy: Secondary | ICD-10-CM | POA: Diagnosis not present

## 2021-07-08 ENCOUNTER — Other Ambulatory Visit: Payer: Self-pay

## 2021-07-08 ENCOUNTER — Ambulatory Visit
Admission: RE | Admit: 2021-07-08 | Discharge: 2021-07-08 | Disposition: A | Discharge status: Home / Self Care | Payer: 59 | Source: Ambulatory Visit | Attending: Radiation Oncology | Admitting: Radiation Oncology

## 2021-07-08 ENCOUNTER — Inpatient Hospital Stay: Payer: 59 | Attending: Oncology

## 2021-07-08 ENCOUNTER — Ambulatory Visit: Payer: 59 | Admitting: Occupational Therapy

## 2021-07-08 DIAGNOSIS — C773 Secondary and unspecified malignant neoplasm of axilla and upper limb lymph nodes: Secondary | ICD-10-CM | POA: Insufficient documentation

## 2021-07-08 DIAGNOSIS — Z170 Estrogen receptor positive status [ER+]: Secondary | ICD-10-CM | POA: Insufficient documentation

## 2021-07-08 DIAGNOSIS — C50912 Malignant neoplasm of unspecified site of left female breast: Secondary | ICD-10-CM

## 2021-07-08 DIAGNOSIS — C50412 Malignant neoplasm of upper-outer quadrant of left female breast: Secondary | ICD-10-CM | POA: Insufficient documentation

## 2021-07-08 DIAGNOSIS — Z510 Encounter for antineoplastic radiation therapy: Secondary | ICD-10-CM | POA: Insufficient documentation

## 2021-07-08 LAB — CBC
HCT: 39.4 % (ref 36.0–46.0)
Hemoglobin: 13.3 g/dL (ref 12.0–15.0)
MCH: 33.6 pg (ref 26.0–34.0)
MCHC: 33.8 g/dL (ref 30.0–36.0)
MCV: 99.5 fL (ref 80.0–100.0)
Platelets: 250 10*3/uL (ref 150–400)
RBC: 3.96 MIL/uL (ref 3.87–5.11)
RDW: 13.1 % (ref 11.5–15.5)
WBC: 6.1 10*3/uL (ref 4.0–10.5)
nRBC: 0 % (ref 0.0–0.2)

## 2021-07-08 NOTE — Therapy (Signed)
[place] MAIN [facility] SERVICES [address] [place], [place], [address] Phone: [phone]   Fax:  [phone]  Occupational Therapy Treatment  Patient Details  Name: Erika Rios MRN: [id number] Date of Birth: Apr 30, 1961 Referring Provider (OT): Robert Bellow, MD   Encounter Date: 07/03/2021   OT End of Session - 07/08/21 0819     Visit Number 3    Number of Visits 36    Date for OT Re-Evaluation 09/25/21    Activity Tolerance Patient tolerated treatment well;No increased pain    Behavior During Therapy [facility] for tasks assessed/performed             Past Medical History:  Diagnosis Date   AICD (automatic cardioverter/defibrillator) present 10/22/2018   CAD (coronary artery disease)    CHF (congestive heart failure) (HCC)    COPD (chronic obstructive pulmonary disease) (HCC)    DOE (dyspnea on exertion)    Dyspnea    Family history of adverse reaction to anesthesia    (+) 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   GERD (gastroesophageal reflux disease)    History of 2019 novel coronavirus disease (COVID-19) 04/23/2020   HLD (hyperlipidemia)    HTN (hypertension)    Invasive carcinoma of breast ([place]) 01/23/2021   LEFT invasive mammary carcinoma; ER/PR (+), HER2/neu (+)   Ischemic cardiomyopathy    s/p AICD placement   ST elevation myocardial infarction (STEMI) of anterior wall, initial episode of care ([place]) 02/24/2016   DES x 1 to pLAD   Statin intolerance    T2DM (type 2 diabetes mellitus) ([place])    Tobacco abuse    quit smoking 2017    Past Surgical History:  Procedure Laterality Date   BREAST BIOPSY Right 12/10/2016   neg bx FIBROADENOMATOUS CHANGES WITH STROMAL AND LUMINAL CALCIFICATIONS. NEGATIVE   BREAST LUMPECTOMY Left 03/15/2021   lumpectomy with NL bracketing   BREAST LUMPECTOMY WITH NEEDLE LOCALIZATION Left 03/15/2021   Procedure: BREAST LUMPECTOMY WITH NEEDLE LOCALIZATION;  Surgeon: Robert Bellow, MD;   Location: [facility] ORS;  Service: General;  Laterality: Left;   CARDIAC CATHETERIZATION N/A 02/24/2016   Procedure: Left Heart Cath and Coronary Angiography;  Surgeon: Troy Sine, MD;  Location: [place] CV LAB;  Service: Cardiovascular;  Laterality: N/A;   CARDIAC CATHETERIZATION N/A 02/24/2016   Procedure: Coronary Stent Intervention (3.2518 mm Xience Alpine DES stent);  Surgeon: Troy Sine, MD;  Location: [place] CV LAB;  Service: Cardiovascular   CHOLECYSTECTOMY N/A 02/02/2019   Procedure: LAPAROSCOPIC CHOLECYSTECTOMY - LATEX ALLERGY;  Surgeon: Olean Ree, MD;  Location: [facility] ORS;  Service: General;  Laterality: N/A;   IMPLANTABLE CARDIOVERTER DEFIBRILLATOR (ICD) GENERATOR CHANGE Left 10/22/2018   Procedure: ICD GENERATOR INSERTION;  Surgeon: Marzetta Board, MD;  Location: [facility] ORS;  Service: Cardiovascular;  Laterality: Left;   INCISION AND DRAINAGE ABSCESS Left 04/09/2021   Procedure: INCISION AND DRAINAGE ABSCESS;  Surgeon: Robert Bellow, MD;  Location: [facility] ORS;  Service: General;  Laterality: Left;  breast   IR BILIARY DRAIN PLACEMENT WITH CHOLANGIOGRAM      There were no vitals filed for this visit.   Subjective Assessment - 07/08/21 0820     Subjective  Erika Rios presents for OT visit 3/36 to address LUE/LUQ post-mastectomy lymphedema. Pt denies LE related pain and fluctuation in swelling. Pt has no new complaints  today.    Pertinent History Stage IIIa invasive mammary carcinoma, tripple +. S/P wide excision and ALND, 13/10+. S/p  incision and drainage of L breast abcess/ seroma  04/09/21. Currently undergoing XRT to whole L breast and periferal LN. 2019 novel coronavirus, HTN,  Cardiomyopathy, MI, Ejection fraction 20-25%, Tobacco abuse. T2DM.    Limitations decreased endurance, obesity, Onset LUE/ LUQ sweling s/p lumpectomy and SLNB (13/10+) 03/2021.    Special Tests -Stemmer; [place] FOTO  98/100    Patient Stated Goals get sleeve and learn about lymphedema     Pain Onset --   5 years                         OT Treatments/Exercises (OP) - 07/08/21 0820       Manual Therapy   Manual Therapy Edema management;Manual Lymphatic Drainage (MLD);Compression Bandaging    Edema Management completed anatomical measurements for custom, flat knit, ccl 2 Jobst Elvarex armsleeve and faxed to vendor after session. Unable to fit Pt with off-the-shelf sleeve as wrist and fatty upper arm do not fit        into OTS garment size ranged.    Manual Lymphatic Drainage (MLD) commenced LUE/LUQ MLD utilizing modified short neck sequence, alternative AAA , PAA and AI pathways, deep breathing , and J strokes to arm and hand proximal to distal.    Compression Bandaging no wraps. Vo9lume differential is low , so we will utilize sleeve for daily compression as soon as it is fitted.                    OT Education - 07/08/21 0824     Education Details Continued Pt/ CG edu for lymphedema self care  and home program throughout session. Topics include multilayer, gradient compression wrapping, simple self-MLD, therapeutic lymphatic pumping exercises, skin/nail care, risk reduction factors and LE precautions, compression garments/recommendations and wear and care schedule and compression garment donning / doffing using assistive devices. All questions answered to the Pt's satisfaction, and Pt demonstrates understanding by report.    Person(s) Educated Patient    Methods Explanation;Demonstration;Handout    Comprehension Verbalized understanding;Returned demonstration                 OT Long Term Goals - 06/28/21 1135       OT LONG TERM GOAL #1   Title Given this patient's Intake score of 98/100 on the functional outcomes FOTO tool, patient will experience an increase in function of 2 points or higher, to increase functional independence performance of basic and instrumental ADLs, including lymphedema self-care.    Baseline 98/100    Time 12     Period Weeks    Status New    Target Date 09/26/21      OT LONG TERM GOAL #2   Title Pt will demonstrate understanding of lymphedema prevention strategies by discussing 5 precautions using printed reference (modified assistance) to reduce risk of progression and to limit infection risk.    Baseline max A    Time 4    Period Days    Status New    Target Date --   4th OT Rx visit     OT LONG TERM GOAL #3   Title Pt will achieve at least a 10% limb volume reduction in LUE to return limb to normal size and shape,  to limit lymphedema progression, to limit infection risk, and to improve  ADLs performance.    Baseline dependent    Time 12    Period Weeks    Status New    Target  Date 09/26/21      OT LONG TERM GOAL #4   Title Pt will achieve and sustain a least 85% compliance with all LE self-care home program components throughout Intensive Phase CDTto limit LE progression.    Baseline dependent    Time 12    Period Weeks    Status New    Target Date 09/26/21      OT LONG TERM GOAL #5   Title Pt will be able to correctly don/ doff and position compression arm sleeve using assistive device/s ( modified independence) to limit LE progression.    Baseline Max A    Time 12    Period Weeks    Status New    Target Date 09/26/21                   Plan - 07/08/21 0819     Clinical Impression Statement Commenced LUE/LUQ MLD in supine and sidelying. In keeping with skin precautions I omitted strokes to radiation field and directing lymphatic overload towards alternative contralateral R axillary LN via anterior and posterior  anastamoses, and towards adjacent L inguinal LN via axillary -inguinal anastamosis.Pt tolerated MLD without increased pain. No visible change in limb volume or breast swelling observed after manual therapy. Completed anatomical measurements for custom, ccl 2, Jobst Elvarex SOFT armsleeve and faxed to DME vendor. Will fit ASAP. No plans to use compression wraps at  this stage. Cont as pre POC.    OT Occupational Profile and History Comprehensive Assessment- Review of records and extensive additional review of physical, cognitive, psychosocial history related to current functional performance    Occupational performance deficits (Please refer to evaluation for details): ADL's;IADL's;Work;Leisure;Social Participation    Body Structure / Function / Physical Skills ADL;Skin integrity;UE functional use;Decreased knowledge of precautions;Decreased knowledge of use of DME;Edema    Rehab Potential Good    Clinical Decision Making Several treatment options, min-mod task modification necessary    Comorbidities Affecting Occupational Performance: Presence of comorbidities impacting occupational performance    Comorbidities impacting occupational performance description: Cardiomyopathy,COPD, CAD, Hx MI, obesity, tobacco abuse    Modification or Assistance to Complete Evaluation  Min-Moderate modification of tasks or assist with assess necessary to complete eval    OT Frequency 2x / week    OT Duration 12 weeks   and PRN   OT Treatment/Interventions Self-care/ADL training;Therapeutic exercise;Manual Therapy;Coping strategies training;Therapeutic activities;Manual lymph drainage;Energy conservation;DME and/or AE instruction;Compression bandaging;Patient/family education;Other (comment)    Plan CDT: manual lymphatic drainage (MLD)  using defined anastamoses towards adjacent lymphatic watersheds to offload radiation field. No strokes in radiated field during and at least 8 weeks after XRT. Compression: Comparative limb volumetrics TBA at next visit to datermine wether compression wraps are needed, or if Pt can progress to garment directly. Therex for lymphatic pumping without overloading affected quadrant, and skin care with low ph castor oil to non-radiated skin to ensure optimal hydration and flexibility for MLD    OT Home Exercise Plan fit with appropriate compression arm  sleeve    Recommended Other Services teach spouse simple self MLD if interested    Consulted and Agree with Plan of Care Patient             Patient will benefit from skilled therapeutic intervention in order to improve the following deficits and impairments:   Body Structure / Function / Physical Skills: ADL, Skin integrity, UE functional use, Decreased knowledge of precautions, Decreased knowledge of use of DME, Edema  Visit Diagnosis: Postmastectomy lymphedema syndrome    Problem List Patient Active Problem List   Diagnosis Date Noted   Carcinoma of breast, female, left ([place]) 01/24/2021   Liver abscess 11/26/2018   Acute cholecystitis 10/28/2018   AICD (automatic cardioverter/defibrillator) present 26/20/3559   Chronic systolic heart failure ([place]) 10/22/2018   COPD, mild ([place]) 06/02/2018   SOB (shortness of breath) on exertion 02/17/2018   Diverticulosis of sigmoid colon 12/01/2017   HLD (hyperlipidemia) 10/15/2017   Low vitamin B12 level 06/13/2016   Cardiomyopathy, ischemic 04/07/2016   Unstable angina ([place]) 04/01/2016   Coronary artery disease involving native coronary artery of native heart without angina pectoris 03/13/2016   Status post coronary artery stent placement    STEMI (ST elevation myocardial infarction) ([place]) 02/24/2016   Tobacco abuse    ST elevation myocardial infarction (STEMI) of anterior wall, initial episode of care [facility])    ST elevation myocardial infarction involving left anterior descending (LAD) coronary artery ([place])    Andrey Spearman, MS, OTR/L, CLT-LANA 07/08/21 8:26 AM   [place] MAIN [facility] SERVICES [place], [place], [address] Phone: [phone]   Fax:  [phone]  Name: ASSYRIA MORREALE MRN: [id number] Date of Birth: 03/12/1961

## 2021-07-09 ENCOUNTER — Ambulatory Visit
Admission: RE | Admit: 2021-07-09 | Discharge: 2021-07-09 | Disposition: A | Discharge status: Home / Self Care | Payer: 59 | Source: Ambulatory Visit | Attending: Radiation Oncology | Admitting: Radiation Oncology

## 2021-07-09 DIAGNOSIS — Z510 Encounter for antineoplastic radiation therapy: Secondary | ICD-10-CM | POA: Diagnosis not present

## 2021-07-10 ENCOUNTER — Other Ambulatory Visit: Payer: Self-pay | Admitting: *Deleted

## 2021-07-10 ENCOUNTER — Ambulatory Visit
Admission: RE | Admit: 2021-07-10 | Discharge: 2021-07-10 | Disposition: A | Discharge status: Home / Self Care | Payer: 59 | Source: Ambulatory Visit | Attending: Radiation Oncology | Admitting: Radiation Oncology

## 2021-07-10 DIAGNOSIS — Z510 Encounter for antineoplastic radiation therapy: Secondary | ICD-10-CM | POA: Diagnosis not present

## 2021-07-11 ENCOUNTER — Ambulatory Visit: Payer: 59 | Admitting: Occupational Therapy

## 2021-07-11 ENCOUNTER — Other Ambulatory Visit: Payer: Self-pay

## 2021-07-11 ENCOUNTER — Ambulatory Visit
Admission: RE | Admit: 2021-07-11 | Discharge: 2021-07-11 | Disposition: A | Discharge status: Home / Self Care | Payer: 59 | Source: Ambulatory Visit | Attending: Radiation Oncology | Admitting: Radiation Oncology

## 2021-07-11 DIAGNOSIS — I972 Postmastectomy lymphedema syndrome: Secondary | ICD-10-CM

## 2021-07-11 DIAGNOSIS — Z510 Encounter for antineoplastic radiation therapy: Secondary | ICD-10-CM | POA: Diagnosis not present

## 2021-07-11 NOTE — Therapy (Signed)
[place] MAIN [facility] SERVICES [address] [place], [place], [address] Phone: [phone]   Fax:  [phone]  Occupational Therapy Treatment  Patient Details  Name: Erika Rios MRN: [id number] Date of Birth: 1960-10-20 Referring Provider (OT): Robert Bellow, MD   Encounter Date: 07/11/2021   OT End of Session - 07/11/21 1616     Visit Number 4    Number of Visits 36    Date for OT Re-Evaluation 09/25/21    OT Start Time 0307    OT Stop Time 0355    OT Time Calculation (min) 48 min    Activity Tolerance Patient tolerated treatment well;No increased pain    Behavior During Therapy [facility] for tasks assessed/performed             Past Medical History:  Diagnosis Date   AICD (automatic cardioverter/defibrillator) present 10/22/2018   CAD (coronary artery disease)    CHF (congestive heart failure) (HCC)    COPD (chronic obstructive pulmonary disease) (HCC)    DOE (dyspnea on exertion)    Dyspnea    Family history of adverse reaction to anesthesia    (+) 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   GERD (gastroesophageal reflux disease)    History of 2019 novel coronavirus disease (COVID-19) 04/23/2020   HLD (hyperlipidemia)    HTN (hypertension)    Invasive carcinoma of breast ([place]) 01/23/2021   LEFT invasive mammary carcinoma; ER/PR (+), HER2/neu (+)   Ischemic cardiomyopathy    s/p AICD placement   ST elevation myocardial infarction (STEMI) of anterior wall, initial episode of care ([place]) 02/24/2016   DES x 1 to pLAD   Statin intolerance    T2DM (type 2 diabetes mellitus) ([place])    Tobacco abuse    quit smoking 2017    Past Surgical History:  Procedure Laterality Date   BREAST BIOPSY Right 12/10/2016   neg bx FIBROADENOMATOUS CHANGES WITH STROMAL AND LUMINAL CALCIFICATIONS. NEGATIVE   BREAST LUMPECTOMY Left 03/15/2021   lumpectomy with NL bracketing   BREAST LUMPECTOMY WITH NEEDLE LOCALIZATION Left 03/15/2021   Procedure:  BREAST LUMPECTOMY WITH NEEDLE LOCALIZATION;  Surgeon: Robert Bellow, MD;  Location: [facility] ORS;  Service: General;  Laterality: Left;   CARDIAC CATHETERIZATION N/A 02/24/2016   Procedure: Left Heart Cath and Coronary Angiography;  Surgeon: Troy Sine, MD;  Location: [place] CV LAB;  Service: Cardiovascular;  Laterality: N/A;   CARDIAC CATHETERIZATION N/A 02/24/2016   Procedure: Coronary Stent Intervention (3.2518 mm Xience Alpine DES stent);  Surgeon: Troy Sine, MD;  Location: [place] CV LAB;  Service: Cardiovascular   CHOLECYSTECTOMY N/A 02/02/2019   Procedure: LAPAROSCOPIC CHOLECYSTECTOMY - LATEX ALLERGY;  Surgeon: Olean Ree, MD;  Location: [facility] ORS;  Service: General;  Laterality: N/A;   IMPLANTABLE CARDIOVERTER DEFIBRILLATOR (ICD) GENERATOR CHANGE Left 10/22/2018   Procedure: ICD GENERATOR INSERTION;  Surgeon: Marzetta Board, MD;  Location: [facility] ORS;  Service: Cardiovascular;  Laterality: Left;   INCISION AND DRAINAGE ABSCESS Left 04/09/2021   Procedure: INCISION AND DRAINAGE ABSCESS;  Surgeon: Robert Bellow, MD;  Location: [facility] ORS;  Service: General;  Laterality: Left;  breast   IR BILIARY DRAIN PLACEMENT WITH CHOLANGIOGRAM      There were no vitals filed for this visit.   Subjective Assessment - 07/11/21 1617     Subjective  [name] presents for OT visit 4/36 to address LUE/LUQ post-mastectomy lymphedema. Pt is accompanied by her sister. Pt reports increased pain in breast  today as XRT advances. Pt rates breast pain as 8/10. She denies pain in arm lateral trunck or axilla.    Pertinent History Stage IIIa invasive mammary carcinoma, tripple +. S/P wide excision and ALND, 13/10+. S/p incision and drainage of L breast abcess/ seroma  04/09/21. Currently undergoing XRT to whole L breast and periferal LN. 2019 novel coronavirus, HTN,  Cardiomyopathy, MI, Ejection fraction 20-25%, Tobacco abuse. T2DM.    Limitations decreased endurance, obesity, Onset LUE/ LUQ  sweling s/p lumpectomy and SLNB (13/10+) 03/2021.    Special Tests -Stemmer; Intae FOTO  98/100    Patient Stated Goals get sleeve and learn about lymphedema    Pain Onset --   5 years                         OT Treatments/Exercises (OP) - 07/11/21 1619       ADLs   ADL Education Given Yes      Manual Therapy   Manual Therapy Edema management;Manual Lymphatic Drainage (MLD);Compression Bandaging    Manual Lymphatic Drainage (MLD) LUE/LUQ MLD utilizing modified short neck sequence, alternative AAA , PAA and AI pathways, deep breathing , and J strokes to arm and hand proximal to distal.    Compression Bandaging no wraps. awaiting sleeve fitting                    OT Education - 07/11/21 1620     Education Details Continued Pt/ CG edu for lymphedema self care  and home program throughout session. Topics include multilayer, gradient compression wrapping, simple self-MLD, therapeutic lymphatic pumping exercises, skin/nail care, risk reduction factors and LE precautions, compression garments/recommendations and wear and care schedule and compression garment donning / doffing using assistive devices. All questions answered to the Pt's satisfaction, and Pt demonstrates understanding by report.    Person(s) Educated Patient    Methods Explanation;Demonstration;Handout    Comprehension Verbalized understanding;Returned demonstration                 OT Long Term Goals - 06/28/21 1135       OT LONG TERM GOAL #1   Title Given this patient's Intake score of 98/100 on the functional outcomes FOTO tool, patient will experience an increase in function of 2 points or higher, to increase functional independence performance of basic and instrumental ADLs, including lymphedema self-care.    Baseline 98/100    Time 12    Period Weeks    Status New    Target Date 09/26/21      OT LONG TERM GOAL #2   Title Pt will demonstrate understanding of lymphedema prevention  strategies by discussing 5 precautions using printed reference (modified assistance) to reduce risk of progression and to limit infection risk.    Baseline max A    Time 4    Period Days    Status New    Target Date --   4th OT Rx visit     OT LONG TERM GOAL #3   Title Pt will achieve at least a 10% limb volume reduction in LUE to return limb to normal size and shape,  to limit lymphedema progression, to limit infection risk, and to improve  ADLs performance.    Baseline dependent    Time 12    Period Weeks    Status New    Target Date 09/26/21      OT LONG TERM GOAL #4   Title Pt will achieve  and sustain a least 85% compliance with all LE self-care home program components throughout Intensive Phase CDTto limit LE progression.    Baseline dependent    Time 12    Period Weeks    Status New    Target Date 09/26/21      OT LONG TERM GOAL #5   Title Pt will be able to correctly don/ doff and position compression arm sleeve using assistive device/s ( modified independence) to limit LE progression.    Baseline Max A    Time 12    Period Weeks    Status New    Target Date 09/26/21                   Plan - 07/11/21 1616     Clinical Impression Statement Continued LUE/LUQ MLD in supine and sidelying. In keeping with skin precautions I omitted strokes to radiation field and directing lymphatic overload towards alternative contralateral R axillary LN via anterior and posterior  anastamoses, and towards adjacent L inguinal LN via axillary -inguinal anastamosis.Pt tolerated MLD without increased pain. Cont as per POC.    OT Occupational Profile and History Comprehensive Assessment- Review of records and extensive additional review of physical, cognitive, psychosocial history related to current functional performance    Occupational performance deficits (Please refer to evaluation for details): ADL's;IADL's;Work;Leisure;Social Participation    Body Structure / Function / Physical  Skills ADL;Skin integrity;UE functional use;Decreased knowledge of precautions;Decreased knowledge of use of DME;Edema    Rehab Potential Good    Clinical Decision Making Several treatment options, min-mod task modification necessary    Comorbidities Affecting Occupational Performance: Presence of comorbidities impacting occupational performance    Comorbidities impacting occupational performance description: Cardiomyopathy,COPD, CAD, Hx MI, obesity, tobacco abuse    Modification or Assistance to Complete Evaluation  Min-Moderate modification of tasks or assist with assess necessary to complete eval    OT Frequency 2x / week    OT Duration 12 weeks   and PRN   OT Treatment/Interventions Self-care/ADL training;Therapeutic exercise;Manual Therapy;Coping strategies training;Therapeutic activities;Manual lymph drainage;Energy conservation;DME and/or AE instruction;Compression bandaging;Patient/family education;Other (comment)    Plan CDT: manual lymphatic drainage (MLD)  using defined anastamoses towards adjacent lymphatic watersheds to offload radiation field. No strokes in radiated field during and at least 8 weeks after XRT. Compression: Comparative limb volumetrics TBA at next visit to datermine wether compression wraps are needed, or if Pt can progress to garment directly. Therex for lymphatic pumping without overloading affected quadrant, and skin care with low ph castor oil to non-radiated skin to ensure optimal hydration and flexibility for MLD    OT Home Exercise Plan fit with appropriate compression arm sleeve    Recommended Other Services teach spouse simple self MLD if interested    Consulted and Agree with Plan of Care Patient             Patient will benefit from skilled therapeutic intervention in order to improve the following deficits and impairments:   Body Structure / Function / Physical Skills: ADL, Skin integrity, UE functional use, Decreased knowledge of precautions, Decreased  knowledge of use of DME, Edema       Visit Diagnosis: Postmastectomy lymphedema syndrome    Problem List Patient Active Problem List   Diagnosis Date Noted   Carcinoma of breast, female, left ([place]) 01/24/2021   Liver abscess 11/26/2018   Acute cholecystitis 10/28/2018   AICD (automatic cardioverter/defibrillator) present 08/65/7846   Chronic systolic heart failure ([place]) 10/22/2018  COPD, mild ([place]) 06/02/2018   SOB (shortness of breath) on exertion 02/17/2018   Diverticulosis of sigmoid colon 12/01/2017   HLD (hyperlipidemia) 10/15/2017   Low vitamin B12 level 06/13/2016   Cardiomyopathy, ischemic 04/07/2016   Unstable angina ([place]) 04/01/2016   Coronary artery disease involving native coronary artery of native heart without angina pectoris 03/13/2016   Status post coronary artery stent placement    STEMI (ST elevation myocardial infarction) ([place]) 02/24/2016   Tobacco abuse    ST elevation myocardial infarction (STEMI) of anterior wall, initial episode of care [facility])    ST elevation myocardial infarction involving left anterior descending (LAD) coronary artery ([place])     Andrey Spearman, MS, OTR/L, CLT-LANA 07/11/21 4:20 PM    [place], [place], [address] Phone: [phone]   Fax:  [phone]  Name: JELENA MALICOAT MRN: [id number] Date of Birth: Jul 18, 1961

## 2021-07-12 ENCOUNTER — Ambulatory Visit
Admission: RE | Admit: 2021-07-12 | Discharge: 2021-07-12 | Disposition: A | Discharge status: Home / Self Care | Payer: 59 | Source: Ambulatory Visit | Attending: Radiation Oncology | Admitting: Radiation Oncology

## 2021-07-12 DIAGNOSIS — Z510 Encounter for antineoplastic radiation therapy: Secondary | ICD-10-CM | POA: Diagnosis not present

## 2021-07-15 ENCOUNTER — Ambulatory Visit
Admission: RE | Admit: 2021-07-15 | Discharge: 2021-07-15 | Disposition: A | Discharge status: Home / Self Care | Payer: 59 | Source: Ambulatory Visit | Attending: Radiation Oncology | Admitting: Radiation Oncology

## 2021-07-15 DIAGNOSIS — Z510 Encounter for antineoplastic radiation therapy: Secondary | ICD-10-CM | POA: Diagnosis not present

## 2021-07-16 ENCOUNTER — Ambulatory Visit
Admission: RE | Admit: 2021-07-16 | Discharge: 2021-07-16 | Disposition: A | Discharge status: Home / Self Care | Payer: 59 | Source: Ambulatory Visit | Attending: Radiation Oncology | Admitting: Radiation Oncology

## 2021-07-16 DIAGNOSIS — Z510 Encounter for antineoplastic radiation therapy: Secondary | ICD-10-CM | POA: Diagnosis not present

## 2021-07-17 ENCOUNTER — Ambulatory Visit
Admission: RE | Admit: 2021-07-17 | Discharge: 2021-07-17 | Disposition: A | Discharge status: Home / Self Care | Payer: 59 | Source: Ambulatory Visit | Attending: Radiation Oncology | Admitting: Radiation Oncology

## 2021-07-17 DIAGNOSIS — Z510 Encounter for antineoplastic radiation therapy: Secondary | ICD-10-CM | POA: Diagnosis not present

## 2021-07-18 ENCOUNTER — Ambulatory Visit
Admission: RE | Admit: 2021-07-18 | Discharge: 2021-07-18 | Disposition: A | Discharge status: Home / Self Care | Payer: 59 | Source: Ambulatory Visit | Attending: Radiation Oncology | Admitting: Radiation Oncology

## 2021-07-18 ENCOUNTER — Ambulatory Visit: Payer: 59 | Admitting: Occupational Therapy

## 2021-07-18 DIAGNOSIS — Z510 Encounter for antineoplastic radiation therapy: Secondary | ICD-10-CM | POA: Diagnosis not present

## 2021-07-19 ENCOUNTER — Ambulatory Visit
Admission: RE | Admit: 2021-07-19 | Discharge: 2021-07-19 | Disposition: A | Discharge status: Home / Self Care | Payer: 59 | Source: Ambulatory Visit | Attending: Radiation Oncology | Admitting: Radiation Oncology

## 2021-07-19 DIAGNOSIS — Z510 Encounter for antineoplastic radiation therapy: Secondary | ICD-10-CM | POA: Diagnosis not present

## 2021-07-22 ENCOUNTER — Inpatient Hospital Stay: Payer: 59

## 2021-07-22 ENCOUNTER — Ambulatory Visit: Payer: 59 | Admitting: Occupational Therapy

## 2021-07-22 ENCOUNTER — Other Ambulatory Visit: Payer: Self-pay

## 2021-07-22 ENCOUNTER — Ambulatory Visit
Admission: RE | Admit: 2021-07-22 | Discharge: 2021-07-22 | Disposition: A | Discharge status: Home / Self Care | Payer: 59 | Source: Ambulatory Visit | Attending: Radiation Oncology | Admitting: Radiation Oncology

## 2021-07-22 DIAGNOSIS — I972 Postmastectomy lymphedema syndrome: Secondary | ICD-10-CM

## 2021-07-22 DIAGNOSIS — C50912 Malignant neoplasm of unspecified site of left female breast: Secondary | ICD-10-CM

## 2021-07-22 DIAGNOSIS — Z510 Encounter for antineoplastic radiation therapy: Secondary | ICD-10-CM | POA: Diagnosis not present

## 2021-07-22 LAB — CBC
HCT: 40.7 % (ref 36.0–46.0)
Hemoglobin: 13.9 g/dL (ref 12.0–15.0)
MCH: 33.9 pg (ref 26.0–34.0)
MCHC: 34.2 g/dL (ref 30.0–36.0)
MCV: 99.3 fL (ref 80.0–100.0)
Platelets: 299 10*3/uL (ref 150–400)
RBC: 4.1 MIL/uL (ref 3.87–5.11)
RDW: 13.2 % (ref 11.5–15.5)
WBC: 7 10*3/uL (ref 4.0–10.5)
nRBC: 0 % (ref 0.0–0.2)

## 2021-07-22 NOTE — Therapy (Signed)
[place] MAIN [facility] SERVICES [address] [place], [place], [address] Phone: [phone]   Fax:  [phone]  Occupational Therapy Treatment  Patient Details  Name: Erika Rios MRN: [id number] Date of Birth: 28-Oct-1960 Referring Provider (OT): Robert Bellow, MD   Encounter Date: 07/22/2021   OT End of Session - 07/22/21 1511     Visit Number 5    Number of Visits 36    Date for OT Re-Evaluation 09/25/21    OT Start Time 0305    OT Stop Time 0350    OT Time Calculation (min) 45 min    Activity Tolerance Patient tolerated treatment well;No increased pain    Behavior During Therapy [facility] for tasks assessed/performed             Past Medical History:  Diagnosis Date   AICD (automatic cardioverter/defibrillator) present 10/22/2018   CAD (coronary artery disease)    CHF (congestive heart failure) (HCC)    COPD (chronic obstructive pulmonary disease) (HCC)    DOE (dyspnea on exertion)    Dyspnea    Family history of adverse reaction to anesthesia    (+) 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   GERD (gastroesophageal reflux disease)    History of 2019 novel coronavirus disease (COVID-19) 04/23/2020   HLD (hyperlipidemia)    HTN (hypertension)    Invasive carcinoma of breast ([place]) 01/23/2021   LEFT invasive mammary carcinoma; ER/PR (+), HER2/neu (+)   Ischemic cardiomyopathy    s/p AICD placement   ST elevation myocardial infarction (STEMI) of anterior wall, initial episode of care ([place]) 02/24/2016   DES x 1 to pLAD   Statin intolerance    T2DM (type 2 diabetes mellitus) ([place])    Tobacco abuse    quit smoking 2017    Past Surgical History:  Procedure Laterality Date   BREAST BIOPSY Right 12/10/2016   neg bx FIBROADENOMATOUS CHANGES WITH STROMAL AND LUMINAL CALCIFICATIONS. NEGATIVE   BREAST LUMPECTOMY Left 03/15/2021   lumpectomy with NL bracketing   BREAST LUMPECTOMY WITH NEEDLE LOCALIZATION Left 03/15/2021   Procedure:  BREAST LUMPECTOMY WITH NEEDLE LOCALIZATION;  Surgeon: Robert Bellow, MD;  Location: [facility] ORS;  Service: General;  Laterality: Left;   CARDIAC CATHETERIZATION N/A 02/24/2016   Procedure: Left Heart Cath and Coronary Angiography;  Surgeon: Troy Sine, MD;  Location: [place] CV LAB;  Service: Cardiovascular;  Laterality: N/A;   CARDIAC CATHETERIZATION N/A 02/24/2016   Procedure: Coronary Stent Intervention (3.2518 mm Xience Alpine DES stent);  Surgeon: Troy Sine, MD;  Location: [place] CV LAB;  Service: Cardiovascular   CHOLECYSTECTOMY N/A 02/02/2019   Procedure: LAPAROSCOPIC CHOLECYSTECTOMY - LATEX ALLERGY;  Surgeon: Olean Ree, MD;  Location: [facility] ORS;  Service: General;  Laterality: N/A;   IMPLANTABLE CARDIOVERTER DEFIBRILLATOR (ICD) GENERATOR CHANGE Left 10/22/2018   Procedure: ICD GENERATOR INSERTION;  Surgeon: Marzetta Board, MD;  Location: [facility] ORS;  Service: Cardiovascular;  Laterality: Left;   INCISION AND DRAINAGE ABSCESS Left 04/09/2021   Procedure: INCISION AND DRAINAGE ABSCESS;  Surgeon: Robert Bellow, MD;  Location: [facility] ORS;  Service: General;  Laterality: Left;  breast   IR BILIARY DRAIN PLACEMENT WITH CHOLANGIOGRAM      There were no vitals filed for this visit.   Subjective Assessment - 07/22/21 1608     Subjective  Erika Rios presents for OT visit 5/36 to address LUE/LUQ post-mastectomy lymphedema. Pt is un-accompanied today. Pt reports increased pain in breast today as  XRT advances. Erika Rios denies increased arm or LUQ swelling wince our last visit on 11/10/    Pertinent History Stage IIIa invasive mammary carcinoma, tripple +. S/P wide excision and ALND, 13/10+. S/p incision and drainage of L breast abcess/ seroma  04/09/21. Currently undergoing XRT to whole L breast and periferal LN. 2019 novel coronavirus, HTN,  Cardiomyopathy, MI, Ejection fraction 20-25%, Tobacco abuse. T2DM.    Limitations decreased endurance, obesity, Onset LUE/ LUQ sweling s/p  lumpectomy and SLNB (13/10+) 03/2021.    Special Tests -Stemmer; Intae FOTO  98/100    Patient Stated Goals get sleeve and learn about lymphedema    Pain Onset --   5 years                         OT Treatments/Exercises (OP) - 07/22/21 1609       ADLs   ADL Education Given Yes      Manual Therapy   Manual Therapy Edema management;Manual Lymphatic Drainage (MLD);Compression Bandaging    Manual Lymphatic Drainage (MLD) MLD to LUE only. XRT field too painful  for skin stretch across anastomoses.    Compression Bandaging no wraps. awaiting sleeve fitting                    OT Education - 07/22/21 1610     Education Details Continued Pt/ CG edu for lymphedema self care  and home program throughout session. Topics include multilayer, gradient compression wrapping, simple self-MLD, therapeutic lymphatic pumping exercises, skin/nail care, risk reduction factors and LE precautions, compression garments/recommendations and wear and care schedule and compression garment donning / doffing using assistive devices. All questions answered to the Pt's satisfaction, and Pt demonstrates understanding by report.    Person(s) Educated Patient    Methods Explanation;Demonstration;Handout    Comprehension Verbalized understanding;Returned demonstration                 OT Long Term Goals - 06/28/21 1135       OT LONG TERM GOAL #1   Title Given this patient's Intake score of 98/100 on the functional outcomes FOTO tool, patient will experience an increase in function of 2 points or higher, to increase functional independence performance of basic and instrumental ADLs, including lymphedema self-care.    Baseline 98/100    Time 12    Period Weeks    Status New    Target Date 09/26/21      OT LONG TERM GOAL #2   Title Pt will demonstrate understanding of lymphedema prevention strategies by discussing 5 precautions using printed reference (modified assistance) to reduce  risk of progression and to limit infection risk.    Baseline max A    Time 4    Period Days    Status New    Target Date --   4th OT Rx visit     OT LONG TERM GOAL #3   Title Pt will achieve at least a 10% limb volume reduction in LUE to return limb to normal size and shape,  to limit lymphedema progression, to limit infection risk, and to improve  ADLs performance.    Baseline dependent    Time 12    Period Weeks    Status New    Target Date 09/26/21      OT LONG TERM GOAL #4   Title Pt will achieve and sustain a least 85% compliance with all LE self-care home program components throughout Intensive Phase CDTto  limit LE progression.    Baseline dependent    Time 12    Period Weeks    Status New    Target Date 09/26/21      OT LONG TERM GOAL #5   Title Pt will be able to correctly don/ doff and position compression arm sleeve using assistive device/s ( modified independence) to limit LE progression.    Baseline Max A    Time 12    Period Weeks    Status New    Target Date 09/26/21                   Plan - 07/22/21 1557     Clinical Impression Statement Pt unable to tolerate MLD without pain today as skin stretch withing radiation field combined with desquamation on lateral L breast is too painful.  We focused manual therapy on L upper extremity in supine only. OT and Pt agree to hold off on scheduling additional manual lymphedema care until XRT is complete and pain starts to reduuce. I'll call her to fit custom arm sleeve when it arrives from DME vendor. It's been ordered for a few weeks so I expect it any day. Once pain has decreased we'll resume LE care PRN. Cont as per POC.    OT Occupational Profile and History Comprehensive Assessment- Review of records and extensive additional review of physical, cognitive, psychosocial history related to current functional performance    Occupational performance deficits (Please refer to evaluation for details):  ADL's;IADL's;Work;Leisure;Social Participation    Body Structure / Function / Physical Skills ADL;Skin integrity;UE functional use;Decreased knowledge of precautions;Decreased knowledge of use of DME;Edema    Rehab Potential Good    Clinical Decision Making Several treatment options, min-mod task modification necessary    Comorbidities Affecting Occupational Performance: Presence of comorbidities impacting occupational performance    Comorbidities impacting occupational performance description: Cardiomyopathy,COPD, CAD, Hx MI, obesity, tobacco abuse    Modification or Assistance to Complete Evaluation  Min-Moderate modification of tasks or assist with assess necessary to complete eval    OT Frequency 2x / week    OT Duration 12 weeks   and PRN   OT Treatment/Interventions Self-care/ADL training;Therapeutic exercise;Manual Therapy;Coping strategies training;Therapeutic activities;Manual lymph drainage;Energy conservation;DME and/or AE instruction;Compression bandaging;Patient/family education;Other (comment)    Plan CDT: manual lymphatic drainage (MLD)  using defined anastamoses towards adjacent lymphatic watersheds to offload radiation field. No strokes in radiated field during and at least 8 weeks after XRT. Compression: Comparative limb volumetrics TBA at next visit to datermine wether compression wraps are needed, or if Pt can progress to garment directly. Therex for lymphatic pumping without overloading affected quadrant, and skin care with low ph castor oil to non-radiated skin to ensure optimal hydration and flexibility for MLD    OT Home Exercise Plan fit with appropriate compression arm sleeve    Recommended Other Services teach spouse simple self MLD if interested    Consulted and Agree with Plan of Care Patient             Patient will benefit from skilled therapeutic intervention in order to improve the following deficits and impairments:   Body Structure / Function / Physical  Skills: ADL, Skin integrity, UE functional use, Decreased knowledge of precautions, Decreased knowledge of use of DME, Edema       Visit Diagnosis: Postmastectomy lymphedema syndrome    Problem List Patient Active Problem List   Diagnosis Date Noted   Carcinoma of breast, female, left ([place])  01/24/2021   Liver abscess 11/26/2018   Acute cholecystitis 10/28/2018   AICD (automatic cardioverter/defibrillator) present 29/92/4268   Chronic systolic heart failure ([place]) 10/22/2018   COPD, mild ([place]) 06/02/2018   SOB (shortness of breath) on exertion 02/17/2018   Diverticulosis of sigmoid colon 12/01/2017   HLD (hyperlipidemia) 10/15/2017   Low vitamin B12 level 06/13/2016   Cardiomyopathy, ischemic 04/07/2016   Unstable angina ([place]) 04/01/2016   Coronary artery disease involving native coronary artery of native heart without angina pectoris 03/13/2016   Status post coronary artery stent placement    STEMI (ST elevation myocardial infarction) ([place]) 02/24/2016   Tobacco abuse    ST elevation myocardial infarction (STEMI) of anterior wall, initial episode of care [facility])    ST elevation myocardial infarction involving left anterior descending (LAD) coronary artery ([place])     Andrey Spearman, MS, OTR/L, CLT-LANA 07/22/21 4:11 PM   [place] MAIN [facility] SERVICES [address] [place], [place], [address] Phone: [phone]   Fax:  [phone]  Name: Erika Rios MRN: [id number] Date of Birth: [birth year]

## 2021-07-23 ENCOUNTER — Ambulatory Visit
Admission: RE | Admit: 2021-07-23 | Discharge: 2021-07-23 | Disposition: A | Discharge status: Home / Self Care | Payer: 59 | Source: Ambulatory Visit | Attending: Radiation Oncology | Admitting: Radiation Oncology

## 2021-07-23 DIAGNOSIS — Z510 Encounter for antineoplastic radiation therapy: Secondary | ICD-10-CM | POA: Diagnosis not present

## 2021-07-24 ENCOUNTER — Ambulatory Visit
Admission: RE | Admit: 2021-07-24 | Discharge: 2021-07-24 | Disposition: A | Discharge status: Home / Self Care | Payer: 59 | Source: Ambulatory Visit | Attending: Radiation Oncology | Admitting: Radiation Oncology

## 2021-07-24 DIAGNOSIS — Z510 Encounter for antineoplastic radiation therapy: Secondary | ICD-10-CM | POA: Diagnosis not present

## 2021-07-29 ENCOUNTER — Ambulatory Visit
Admission: RE | Admit: 2021-07-29 | Discharge: 2021-07-29 | Disposition: A | Discharge status: Home / Self Care | Payer: 59 | Source: Ambulatory Visit | Attending: Radiation Oncology | Admitting: Radiation Oncology

## 2021-07-29 DIAGNOSIS — Z510 Encounter for antineoplastic radiation therapy: Secondary | ICD-10-CM | POA: Diagnosis not present

## 2021-07-30 ENCOUNTER — Ambulatory Visit
Admission: RE | Admit: 2021-07-30 | Discharge: 2021-07-30 | Disposition: A | Discharge status: Home / Self Care | Payer: 59 | Source: Ambulatory Visit | Attending: Radiation Oncology | Admitting: Radiation Oncology

## 2021-07-30 DIAGNOSIS — Z510 Encounter for antineoplastic radiation therapy: Secondary | ICD-10-CM | POA: Diagnosis not present

## 2021-07-31 ENCOUNTER — Ambulatory Visit
Admission: RE | Admit: 2021-07-31 | Discharge: 2021-07-31 | Disposition: A | Discharge status: Home / Self Care | Payer: 59 | Source: Ambulatory Visit | Attending: Radiation Oncology | Admitting: Radiation Oncology

## 2021-07-31 DIAGNOSIS — Z510 Encounter for antineoplastic radiation therapy: Secondary | ICD-10-CM | POA: Diagnosis not present

## 2021-08-09 ENCOUNTER — Encounter: Payer: Self-pay | Admitting: Oncology

## 2021-08-28 ENCOUNTER — Encounter: Payer: Self-pay | Admitting: Oncology

## 2021-08-28 ENCOUNTER — Other Ambulatory Visit: Payer: Self-pay

## 2021-08-28 ENCOUNTER — Inpatient Hospital Stay: Payer: 59 | Attending: Oncology | Admitting: Oncology

## 2021-08-28 VITALS — BP 111/73 | HR 69 | Temp 97.5°F | Resp 18 | Wt 204.7 lb

## 2021-08-28 DIAGNOSIS — C50912 Malignant neoplasm of unspecified site of left female breast: Secondary | ICD-10-CM | POA: Diagnosis not present

## 2021-08-28 DIAGNOSIS — I255 Ischemic cardiomyopathy: Secondary | ICD-10-CM | POA: Insufficient documentation

## 2021-08-28 DIAGNOSIS — Z79811 Long term (current) use of aromatase inhibitors: Secondary | ICD-10-CM | POA: Insufficient documentation

## 2021-08-28 DIAGNOSIS — Z809 Family history of malignant neoplasm, unspecified: Secondary | ICD-10-CM

## 2021-08-28 DIAGNOSIS — C50412 Malignant neoplasm of upper-outer quadrant of left female breast: Secondary | ICD-10-CM | POA: Insufficient documentation

## 2021-08-28 DIAGNOSIS — Z170 Estrogen receptor positive status [ER+]: Secondary | ICD-10-CM | POA: Insufficient documentation

## 2021-08-28 NOTE — Progress Notes (Signed)
Patient here to establish care with Dr. Tasia Catchings. Pt stopped  taking Letrozole in October, prior to starting radiation. Medication was making top of legs sore.

## 2021-08-28 NOTE — Progress Notes (Signed)
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  History of present illness 60 y.o. female with past medical history including ischemic cardiomyopathy status post AICD placement, LVEF 20 to 25%,  Left breast stage IIIa triple positive invasive carcinoma, status post lumpectomy and axillary dissection, adjuvant radiation presented to establish care. Patient previously followed up by Dr. Grayland Ormond, patient switched care to me on 08/28/21 Extensive medical record review was performed by me  Patient had clinical stage IIB Triple positive left breast.   Patient took letrozole as a protracted evaluation for plastic surgery consultation and surgical intervention was anticipated.  03/15/2021, left breast lumpectomy showed invasive mammary carcinoma, axillary dissection showed metastatic carcinoma involving 10 out of 13 lymph nodes.  Extracapsular extension is present.  mpT2,pN3a, biomarker was tested on previous biopsy.  ER positive, PR positive, HER2 positive 04/18/2021, CT chest abdomen pelvis and 05/01/2021 bone scan showed no distant metastasis.  Dr. Cecille Aver recommended adjuvant chemotherapy with Taxotere, Cytoxan plus trastuzumab  Her case was discussed with Dr. Annabell Sabal at [facility] and recommendation is to proceed with chemotherapy and check a BMP and cardiac enzymes with each cycles.  Could consider to add pertuzumab after several cycles if cardiac status remained stable.  Patient declined chemotherapy.   Patient finished adjuvant radiation.   06/10/2021 - 07/17/2021  Patient mentioned that while she was taking letrozole prior to the surgery, she noticed muscle soreness of her right thigh.  Patient reports that she is currently not taking letrozole. Patient also reports that she saw a naturopathic doctor and she had high-dose vitamin C infusion. Patient was recently seen by Dr. Bary Castilla on 07/24/2021 and had a breast examination.   REVIEW OF SYSTEMS:   Review of Systems  Constitutional:  Negative for chills, fever, malaise/fatigue and weight loss.  HENT:  Negative for sore throat.   Eyes:  Negative for redness.  Respiratory:  Negative for cough, shortness of breath and wheezing.   Cardiovascular:  Negative for chest pain.       Chronic shortness of breath with exertion.  Intermittent pedal edema Rare palpitation  Gastrointestinal:  Negative for abdominal pain, blood in stool, nausea and vomiting.  Genitourinary:  Negative for dysuria.  Musculoskeletal:  Negative for myalgias.  Skin:  Negative for rash.  Neurological:  Negative for dizziness, tingling and tremors.  Endo/Heme/Allergies:  Does not bruise/bleed easily.  Psychiatric/Behavioral:  Negative for hallucinations.      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   GERD (gastroesophageal reflux disease)    History of 2019 novel coronavirus disease (COVID-19) 04/23/2020   HLD (hyperlipidemia)    HTN (hypertension)    Invasive carcinoma of breast ([place]) 01/23/2021   LEFT invasive mammary carcinoma; ER/PR (+), HER2/neu (+)   Ischemic  cardiomyopathy    s/p AICD placement   ST elevation myocardial infarction (STEMI) of anterior  wall, initial episode of care ([place]) 02/24/2016   DES x 1 to pLAD   Statin intolerance    T2DM (type 2 diabetes mellitus) ([place])    Tobacco abuse    quit smoking 2017    PAST SURGICAL HISTORY: Past Surgical History:  Procedure Laterality Date   BREAST BIOPSY Right 12/10/2016   neg bx FIBROADENOMATOUS CHANGES WITH STROMAL AND LUMINAL CALCIFICATIONS. NEGATIVE   BREAST LUMPECTOMY Left 03/15/2021   lumpectomy with NL bracketing   BREAST LUMPECTOMY WITH NEEDLE LOCALIZATION Left 03/15/2021   Procedure: BREAST LUMPECTOMY WITH NEEDLE LOCALIZATION;  Surgeon: Robert Bellow, MD;  Location: [facility] OR[facility];  Service: General;  Laterality: Left;   CARDIAC CATHETERIZATION N/A 02/24/2016   Procedure: Left Heart Cath and Coronary Angiography;  Surgeon: Troy Sine, MD;  Location: [place] [facility];  Service: Cardiovascular;  Laterality: N/A;   CARDIAC CATHETERIZATION N/A 02/24/2016   Procedure: Coronary Stent Intervention (3.2518 mm Xience Alpine DES stent);  Surgeon: Troy Sine, MD;  Location: [place] [facility];  Service: Cardiovascular   CHOLECYSTECTOMY N/A 02/02/2019   Procedure: LAPAROSCOPIC CHOLECYSTECTOMY - LATEX ALLERGY;  Surgeon: Olean Ree, MD;  Location: [facility];  Service: General;  Laterality: N/A;   IMPLANTABLE CARDIOVERTER DEFIBRILLATOR (ICD) GENERATOR CHANGE Left 10/22/2018   Procedure: ICD GENERATOR INSERTION;  Surgeon: Marzetta Board, MD;  Location: [facility];  Service: Cardiovascular;  Laterality: Left;   INCISION AND DRAINAGE ABSCESS Left 04/09/2021   Procedure: INCISION AND DRAINAGE ABSCESS;  Surgeon: Robert Bellow, MD;  Location: [facility];  Service: General;  Laterality: Left;  breast   IR BILIARY DRAIN PLACEMENT WITH CHOLANGIOGRAM      FAMILY HISTORY: Family History  Problem Relation Age of Onset   Cancer Mother    Heart disease Mother    Heart attack Mother    Breast cancer Mother    Diabetes Father     ADVANCED DIRECTIVES (Y/N):  N  HEALTH  MAINTENANCE: Social History   Tobacco Use   Smoking status: Former    Packs/day: 0.50    Years: 34.00    Pack years: 17.00    Types: Cigarettes    Quit date: 03/19/2016    Years since quitting: 5.4   Smokeless tobacco: Never  Vaping Use   Vaping Use: Never used  Substance Use Topics   Alcohol use: Yes    Alcohol/week: 0.0 standard drinks    Comment: occ   Drug use: No     Allergies  Allergen Reactions   Statins Rash and Other (See Comments)    Hip pain, constant    Elastic Bandages & [Zinc] Rash    Paper tape should be okay   Entresto [Sacubitril-Valsartan] Palpitations   Iron Nausea And Vomiting   Latex Dermatitis   Nsaids     Told not to take d/t heart status   Oxycodone Anxiety    Current Outpatient Medications  Medication Sig Dispense Refill   acetaminophen (TYLENOL) 500 MG tablet Take 2 tablets (1,000 mg total) by mouth every 6 (six) hours as needed for mild pain, moderate pain or headache. 30 tablet 0   albuterol (VENTOLIN HFA) 108 (90 Base) MCG/ACT inhaler Inhale 1-2 puffs into the lungs every 6 (six) hours as needed for wheezing or shortness of breath.     aspirin 325 MG EC tablet Take 325 mg by mouth daily.     carvedilol (COREG)  3.125 MG tablet Take 3.125 mg by mouth 2 (two) times daily with a meal.     furosemide (LASIX) 20 MG tablet Take 20 mg by mouth daily.     gabapentin (NEURONTIN) 300 MG capsule Take 300 mg by mouth 3 (three) times daily.     HYDROcodone-acetaminophen (NORCO/VICODIN) 5-325 MG tablet Take 1 tablet by mouth every 4 (four) hours as needed for moderate pain. 15 tablet 0   ibuprofen (ADVIL) 200 MG tablet Take 600 mg by mouth every 6 (six) hours as needed for headache or moderate pain.     lisinopril (ZESTRIL) 5 MG tablet Take 5 mg by mouth daily.     magnesium oxide (MAG-OX) 400 MG tablet Take 400 mg by mouth daily.     Potassium 99 MG TABS Take 198 mg by mouth every evening.     rosuvastatin (CRESTOR) 5 MG tablet Take 5 mg by mouth 2  (two) times a week.     spironolactone (ALDACTONE) 25 MG tablet Take 0.5 tablets (12.5 mg total) by mouth daily. (Patient taking differently: Take 12.5 mg by mouth every evening.) 30 tablet 6   triamcinolone cream (KENALOG) 0.1 % Apply 1 application topically 2 (two) times daily.  3   vitamin B-12 (CYANOCOBALAMIN) 250 MCG tablet Take 250 mcg by mouth daily.     letrozole (FEMARA) 2.5 MG tablet Take 1 tablet (2.5 mg total) by mouth daily. (Patient not taking: Reported on 08/28/2021) 90 tablet 3   nitroGLYCERIN (NITROSTAT) 0.4 MG SL tablet PLACE 1 TABLET UNDER TONGUE EVERY 5 MINUTES IF NEEDED FOR CHEST PAIN UP TO 3 DOSES (Patient not taking: Reported on 08/28/2021) 25 tablet 5   No current facility-administered medications for this visit.    OBJECTIVE: Vitals:   08/28/21 1018  BP: 111/73  Pulse: 69  Resp: 18  Temp: (!) 97.5 F (36.4 C)       Body mass index is 36.84 kg/m.    ECOG FS:1 - Symptomatic but completely ambulatory  Physical Exam Constitutional:      General: She is not in acute distress.    Appearance: She is obese. She is not diaphoretic.  HENT:     Head: Normocephalic and atraumatic.     Nose: Nose normal.     Mouth/Throat:     Pharynx: No oropharyngeal exudate.  Eyes:     General: No scleral icterus.    Pupils: Pupils are equal, round, and reactive to light.  Cardiovascular:     Rate and Rhythm: Normal rate and regular rhythm.     Heart sounds: No murmur heard. Pulmonary:     Effort: Pulmonary effort is normal. No respiratory distress.     Breath sounds: No rales.  Chest:     Chest wall: No tenderness.  Abdominal:     General: There is no distension.     Palpations: Abdomen is soft.     Tenderness: There is no abdominal tenderness.  Musculoskeletal:        General: Normal range of motion.     Cervical back: Normal range of motion and neck supple.  Skin:    General: Skin is warm and dry.     Findings: No erythema.  Neurological:     Mental Status: She  is alert and oriented to person, place, and time.     Cranial Nerves: No cranial nerve deficit.     Motor: No abnormal muscle tone.     Coordination: Coordination normal.  Psychiatric:  Mood and Affect: Mood and affect normal.      LAB RESULTS:  Lab Results  Component Value Date   NA 135 04/09/2021   K 4.5 04/09/2021   CL 102 04/09/2021   CO2 23 04/09/2021   GLUCOSE 130 (H) 04/09/2021   BUN 22 (H) 04/09/2021   CREATININE 0.93 04/09/2021   CALCIUM 9.6 04/09/2021   PROT 6.8 02/02/2019   ALBUMIN 3.7 02/02/2019   AST 20 02/02/2019   ALT 25 02/02/2019   ALKPHOS 88 02/02/2019   BILITOT 0.5 02/02/2019   GFRNONAA >60 04/09/2021   GFRAA >60 02/02/2019    Lab Results  Component Value Date   WBC 7.0 07/22/2021   NEUTROABS 7.9 (H) 04/09/2021   HGB 13.9 07/22/2021   HCT 40.7 07/22/2021   MCV 99.3 07/22/2021   PLT 299 07/22/2021     ASSESSMENT and Plan.  60 y.o. female present for follow-up of pathologic stage IIIa triple positive invasive carcinoma of left breast. 1. Carcinoma of breast, female, left ([place])   2. Family history of cancer   3. Ischemic cardiomyopathy     #Stage III triple positive invasive carcinoma of left breast. Status post lumpectomy and axillary dissection.  Declined adjuvant chemotherapy. Finished adjuvant radiation. Rationale and potential side effects of letrozole were reviewed with patient. Patient plans to try letrozole again.  Patient understands that she is at high risk of disease recurrence. Recommend patient to have mammogram surveillance.  Patient will get mammogram scheduled through Dr. Dwyane Luo office. I recommend baseline bone density-patient will consider and update me. Recommend calcium and vitamin D supplementation Will do breast examination at the next visit.  Patient had a CBC done recently on 07/22/2021.  Results were normal.  Patient is not enthusiastic of having blood work done today.  We will check CBC, CMP at the next  visit.  #Family history of cancer, refer to genetic counselor.  #Cardiomyopathy, continue follow-up with cardiology.  Most recent echocardiogram showed an LVEF of 20 to 25%.  Status post AICD.   Patient expressed understanding and was in agreement with this plan. She also understands that She can call clinic at any time with any questions, concerns, or complaints.    Cancer Staging  Carcinoma of breast, female, left [facility]) Staging form: Breast, AJCC 8th Edition - Pathologic stage from 03/25/2021: Stage IIIA (pT2, pN3a, cM0, G2, ER+, PR+, HER2+) - Signed by Lloyd Huger, MD on 03/25/2021 Histologic grading system: 3 grade system  Follow-up in 4 months. Earlie Server, MD   08/28/2021 8:43 PM

## 2021-09-09 ENCOUNTER — Ambulatory Visit: Payer: 59 | Admitting: Radiation Oncology

## 2021-09-13 NOTE — Telephone Encounter (Signed)
error 

## 2021-09-17 ENCOUNTER — Encounter: Payer: 59 | Admitting: Licensed Clinical Social Worker

## 2021-09-17 ENCOUNTER — Other Ambulatory Visit: Payer: 59

## 2021-09-19 ENCOUNTER — Other Ambulatory Visit: Payer: Self-pay

## 2021-09-19 ENCOUNTER — Ambulatory Visit: Payer: 59 | Attending: General Surgery | Admitting: Occupational Therapy

## 2021-09-19 DIAGNOSIS — I972 Postmastectomy lymphedema syndrome: Secondary | ICD-10-CM | POA: Diagnosis present

## 2021-09-19 NOTE — Patient Instructions (Signed)
Do NOT sleep in compression arm sleeve. Reviewed wear and care instructions.

## 2021-09-19 NOTE — Therapy (Signed)
[place] MAIN [facility] SERVICES [address] [place], [place], [address] Phone: [phone]   Fax:  [phone]  Occupational Therapy Treatment  Patient Details  Name: Erika Rios MRN: [id number] Date of Birth: [birth year] Referring Provider (OT): Robert Bellow, MD   Encounter Date: 09/19/2021   OT End of Session - 09/19/21 1212     Visit Number 6    Number of Visits 36    Date for OT Re-Evaluation 09/25/21    OT Start Time 1100    OT Stop Time 1200    OT Time Calculation (min) 60 min    Activity Tolerance Patient tolerated treatment well;No increased pain    Behavior During Therapy [facility] for tasks assessed/performed             Past Medical History:  Diagnosis Date   AICD (automatic cardioverter/defibrillator) present 10/22/2018   CAD (coronary artery disease)    CHF (congestive heart failure) (HCC)    COPD (chronic obstructive pulmonary disease) (HCC)    DOE (dyspnea on exertion)    Dyspnea    Family history of adverse reaction to anesthesia    (+) 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   GERD (gastroesophageal reflux disease)    History of 2019 novel coronavirus disease (COVID-19) 04/23/2020   HLD (hyperlipidemia)    HTN (hypertension)    Invasive carcinoma of breast ([place]) 01/23/2021   LEFT invasive mammary carcinoma; ER/PR (+), HER2/neu (+)   Ischemic cardiomyopathy    s/p AICD placement   ST elevation myocardial infarction (STEMI) of anterior wall, initial episode of care ([place]) 02/24/2016   DES x 1 to pLAD   Statin intolerance    T2DM (type 2 diabetes mellitus) ([place])    Tobacco abuse    quit smoking 2017    Past Surgical History:  Procedure Laterality Date   BREAST BIOPSY Right 12/10/2016   neg bx FIBROADENOMATOUS CHANGES WITH STROMAL AND LUMINAL CALCIFICATIONS. NEGATIVE   BREAST LUMPECTOMY Left 03/15/2021   lumpectomy with NL bracketing   BREAST LUMPECTOMY WITH NEEDLE LOCALIZATION Left 03/15/2021   Procedure:  BREAST LUMPECTOMY WITH NEEDLE LOCALIZATION;  Surgeon: Robert Bellow, MD;  Location: [facility] ORS;  Service: General;  Laterality: Left;   CARDIAC CATHETERIZATION N/A 02/24/2016   Procedure: Left Heart Cath and Coronary Angiography;  Surgeon: Troy Sine, MD;  Location: [place] CV LAB;  Service: Cardiovascular;  Laterality: N/A;   CARDIAC CATHETERIZATION N/A 02/24/2016   Procedure: Coronary Stent Intervention (3.2518 mm Xience Alpine DES stent);  Surgeon: Troy Sine, MD;  Location: [place] CV LAB;  Service: Cardiovascular   CHOLECYSTECTOMY N/A 02/02/2019   Procedure: LAPAROSCOPIC CHOLECYSTECTOMY - LATEX ALLERGY;  Surgeon: Olean Ree, MD;  Location: [facility] ORS;  Service: General;  Laterality: N/A;   IMPLANTABLE CARDIOVERTER DEFIBRILLATOR (ICD) GENERATOR CHANGE Left 10/22/2018   Procedure: ICD GENERATOR INSERTION;  Surgeon: Marzetta Board, MD;  Location: [facility] ORS;  Service: Cardiovascular;  Laterality: Left;   INCISION AND DRAINAGE ABSCESS Left 04/09/2021   Procedure: INCISION AND DRAINAGE ABSCESS;  Surgeon: Robert Bellow, MD;  Location: [facility] ORS;  Service: General;  Laterality: Left;  breast   IR BILIARY DRAIN PLACEMENT WITH CHOLANGIOGRAM      There were no vitals filed for this visit.   Subjective Assessment - 09/19/21 1226     Subjective  Erika Rios presents for OT visit 6/36 to address LUE/LUQ post-mastectomy lymphedema. Pt is un-accompanied today. Pt reports she is feeling better than last  visit. She completed XRT and recently saw her referring surgeon who feels arm swelling overal is controlled.   ° Pertinent History Stage IIIa invasive mammary carcinoma, tripple +. S/P wide excision and ALND, 13/10+. S/p incision and drainage of L breast abcess/ seroma  04/09/21. Currently undergoing XRT to whole L breast and periferal LN. 2019 novel coronavirus, HTN,  Cardiomyopathy, MI, Ejection fraction 20-25%, Tobacco abuse. T2DM.   ° Limitations decreased endurance, obesity, Onset  LUE/ LUQ sweling s/p lumpectomy and SLNB (13/10+) 03/2021.   ° Special Tests -Stemmer; Intake FOTO  98/100   ° Patient Stated Goals get sleeve and learn about lymphedema   ° Currently in Pain? No/denies   ° Pain Onset --   5 years  ° °  °  ° °  ° ° ° ° ° ° ° ° ° ° ° ° ° ° ° OT Treatments/Exercises (OP) - 09/19/21 1228   ° °  ° ADLs  ° ADL Education Given Yes   °  ° Manual Therapy  ° Manual Therapy Edema management   ° Edema Management custom LUE garment fitting. [employer]st Elvarex SOFT, flat knit, ccl 2 ( 23-32 mmHg) with SB x 2- 1 inside and one on top. Garment too long by ~ 5 cm and wrong color. Requested HONEY. They sent BEIGE. need remake to improve fit and comfort.   ° °  °  ° °  ° ° ° ° ° ° ° ° ° OT Education - 09/19/21 1230   ° ° Education Details Continued Pt/ CG edu for lymphedema self care  and home program throughout session. Topics include multilayer, gradient compression wrapping, simple self-MLD, therapeutic lymphatic pumping exercises, skin/nail care, risk reduction factors and LE precautions, compression garments/recommendations and wear and care schedule and compression garment donning / doffing using assistive devices. All questions answered to the Pt's satisfaction, and Pt demonstrates understanding by report.   ° Person(s) Educated Patient   ° Methods Explanation;Demonstration;Handout   ° Comprehension Verbalized understanding;Returned demonstration   ° °  °  ° °  ° ° ° ° ° ° OT Long Term Goals - 06/28/21 1135   ° °  ° OT LONG TERM GOAL #1  ° Title Given this patient’s Intake score of 98/100 on the functional outcomes FOTO tool, patient will experience an increase in function of 2 points or higher, to increase functional independence performance of basic and instrumental ADLs, including lymphedema self-care.   ° Baseline 98/100   ° Time 12   ° Period Weeks   ° Status New   ° Target Date 09/26/21   °  ° OT LONG TERM GOAL #2  ° Title Pt will demonstrate understanding of lymphedema prevention strategies  by discussing 5 precautions using printed reference (modified assistance) to reduce risk of progression and to limit infection risk.   ° Baseline max A   ° Time 4   ° Period Days   ° Status New   ° Target Date --   4th OT Rx visit  °  ° OT LONG TERM GOAL #3  ° Title Pt will achieve at least a 10% limb volume reduction in LUE to return limb to normal size and shape,  to limit lymphedema progression, to limit infection risk, and to improve  ADLs performance.   ° Baseline dependent   ° Time 12   ° Period Weeks   ° Status New   ° Target Date 09/26/21   °  ° OT LONG TERM   GOAL #4  ° Title Pt will achieve and sustain a least 85% compliance with all LE self-care home program components throughout Intensive Phase CDTto limit LE progression.   ° Baseline dependent   ° Time 12   ° Period Weeks   ° Status New   ° Target Date 09/26/21   °  ° OT LONG TERM GOAL #5  ° Title Pt will be able to correctly don/ doff and position compression arm sleeve using assistive device/s ( modified independence) to limit LE progression.   ° Baseline Max A   ° Time 12   ° Period Weeks   ° Status New   ° Target Date 09/26/21   ° °  °  ° °  ° ° ° ° ° ° ° ° Plan - 09/19/21 1213   ° ° Clinical Impression Statement Completed LUE comparative limb volumetrics. Corrected error in initial LUE volumetrics where one measurement was erroneously entered twice in spreadsheet. Comparison of todays LUE volume with volume measured on 07/02/21 reveals a 3.32% decrease. Limb volume differential measures LUE (Rx, dominant) > than RUE by 11.9%, L>R. LVD is typically 3-4%, so we'll continue to monitor limb volumes for several monmths. Pt has undergone XRT with wound and infection (by reort), ALND and seroma , so we'll give her tissue and lymphatics a bit more time to recover then follow along. Fitted custom compression sleeve today. Overall circumferences fit well and Pt expresses comfort in garment, but upon assessment it appears ~ 5 cm too long with some bunching at  the elbow. Vendor also sent wrong color. OT emailed DME vendor with issues and IDd need for remake. Trained Pt to use custom Tyveck donning aid fabricated by OT to assidst with donning and doffing garment. Taught garment wear and care protocol and precautions. Pt will call  on Monday with any other issues that arise after wearing garment over the weekend, aka hand swelling, etc. Will complete 2nd fitting when garment arrives to endure ptimal fit. Will also re-examine L breast for LE.   ° OT Occupational Profile and History Comprehensive Assessment- Review of records and extensive additional review of physical, cognitive, psychosocial history related to current functional performance   ° Occupational performance deficits (Please refer to evaluation for details): ADL's;IADL's;Work;Leisure;Social Participation   ° Body Structure / Function / Physical Skills ADL;Skin integrity;UE functional use;Decreased knowledge of precautions;Decreased knowledge of use of DME;Edema   ° Rehab Potential Good   ° Clinical Decision Making Several treatment options, min-mod task modification necessary   ° Comorbidities Affecting Occupational Performance: Presence of comorbidities impacting occupational performance   ° Comorbidities impacting occupational performance description: Cardiomyopathy,COPD, CAD, Hx MI, obesity, tobacco abuse   ° Modification or Assistance to Complete Evaluation  Min-Moderate modification of tasks or assist with assess necessary to complete eval   ° OT Frequency 2x / week   ° OT Duration 12 weeks   and PRN  ° OT Treatment/Interventions Self-care/ADL training;Therapeutic exercise;Manual Therapy;Coping strategies training;Therapeutic activities;Manual lymph drainage;Energy conservation;DME and/or AE instruction;Compression bandaging;Patient/family education;Other (comment)   ° Plan CDT: manual lymphatic drainage (MLD)  using defined anastamoses towards adjacent lymphatic watersheds to offload radiation field. No  strokes in radiated field during and at least 8 weeks after XRT. Compression: Comparative limb volumetrics TBA at next visit to datermine wether compression wraps are needed, or if Pt can progress to garment directly. Therex for lymphatic pumping without overloading affected quadrant, and skin care with low ph castor oil to non-radiated skin to   ensure optimal hydration and flexibility for MLD    OT Home Exercise Plan fit with appropriate compression arm sleeve    Recommended Other Services teach spouse simple self MLD if interested    Consulted and Agree with Plan of Care Patient             Patient will benefit from skilled therapeutic intervention in order to improve the following deficits and impairments:   Body Structure / Function / Physical Skills: ADL, Skin integrity, UE functional use, Decreased knowledge of precautions, Decreased knowledge of use of DME, Edema       Visit Diagnosis: Postmastectomy lymphedema syndrome    Problem List Patient Active Problem List   Diagnosis Date Noted   Family history of cancer 08/28/2021   Carcinoma of breast, female, left ([place]) 01/24/2021   Liver abscess 11/26/2018   Acute cholecystitis 10/28/2018   AICD (automatic cardioverter/defibrillator) present 38/18/2993   Chronic systolic heart failure ([place]) 10/22/2018   COPD, mild ([place] ) 06/02/2018   SOB (shortness of breath) on exertion 02/17/2018   Diverticulosis of sigmoid colon 12/01/2017   HLD (hyperlipidemia) 10/15/2017   Low vitamin B12 level 06/13/2016   Cardiomyopathy, ischemic 04/07/2016   Unstable angina ([place]) 04/01/2016   Coronary artery disease involving native coronary artery of native heart without angina pectoris 03/13/2016   Status post coronary artery stent placement    STEMI (ST elevation myocardial infarction) ([place]) 02/24/2016   Tobacco abuse    ST elevation myocardial infarction (STEMI) of anterior wall, initial episode of care [facility])    ST elevation myocardial  infarction involving left anterior descending (LAD) coronary artery ([place])   Andrey Spearman, MS, OTR/L, CLT-LANA 09/19/21 4:10 PM   [place], [place], [address] Phone: [phone]   Fax:  [phone]  Name: BREEANA SAWTELLE MRN: [id number] Date of Birth: [birth year]

## 2021-09-25 ENCOUNTER — Ambulatory Visit: Payer: 59 | Admitting: Occupational Therapy

## 2021-10-01 ENCOUNTER — Other Ambulatory Visit: Payer: Self-pay

## 2021-10-01 ENCOUNTER — Ambulatory Visit: Payer: 59 | Admitting: Occupational Therapy

## 2021-10-01 DIAGNOSIS — I972 Postmastectomy lymphedema syndrome: Secondary | ICD-10-CM | POA: Diagnosis not present

## 2021-10-01 NOTE — Therapy (Signed)
[place] MAIN [facility] SERVICES [address] [place], [place], [address] Phone: [phone]   Fax:  [phone]  Occupational Therapy Treatment  Patient Details  Name: Erika Rios MRN: [id number] Date of Birth: May 28, 1961 Referring Provider (OT): Robert Bellow, MD   Encounter Date: 10/01/2021   OT End of Session - 10/01/21 1150     Visit Number 7    Number of Visits 36    Date for OT Re-Evaluation 12/30/21    OT Start Time 1110    OT Stop Time 1140    OT Time Calculation (min) 30 min    Activity Tolerance Patient tolerated treatment well;No increased pain    Behavior During Therapy [facility] for tasks assessed/performed             Past Medical History:  Diagnosis Date   AICD (automatic cardioverter/defibrillator) present 10/22/2018   CAD (coronary artery disease)    CHF (congestive heart failure) (HCC)    COPD (chronic obstructive pulmonary disease) (HCC)    DOE (dyspnea on exertion)    Dyspnea    Family history of adverse reaction to anesthesia    (+) 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   GERD (gastroesophageal reflux disease)    History of 2019 novel coronavirus disease (COVID-19) 04/23/2020   HLD (hyperlipidemia)    HTN (hypertension)    Invasive carcinoma of breast ([place]) 01/23/2021   LEFT invasive mammary carcinoma; ER/PR (+), HER2/neu (+)   Ischemic cardiomyopathy    s/p AICD placement   ST elevation myocardial infarction (STEMI) of anterior wall, initial episode of care ([place]) 02/24/2016   DES x 1 to pLAD   Statin intolerance    T2DM (type 2 diabetes mellitus) ([place])    Tobacco abuse    quit smoking 2017    Past Surgical History:  Procedure Laterality Date   BREAST BIOPSY Right 12/10/2016   neg bx FIBROADENOMATOUS CHANGES WITH STROMAL AND LUMINAL CALCIFICATIONS. NEGATIVE   BREAST LUMPECTOMY Left 03/15/2021   lumpectomy with NL bracketing   BREAST LUMPECTOMY WITH NEEDLE LOCALIZATION Left 03/15/2021   Procedure:  BREAST LUMPECTOMY WITH NEEDLE LOCALIZATION;  Surgeon: Robert Bellow, MD;  Location: [facility] ORS;  Service: General;  Laterality: Left;   CARDIAC CATHETERIZATION N/A 02/24/2016   Procedure: Left Heart Cath and Coronary Angiography;  Surgeon: Troy Sine, MD;  Location: [place] CV LAB;  Service: Cardiovascular;  Laterality: N/A;   CARDIAC CATHETERIZATION N/A 02/24/2016   Procedure: Coronary Stent Intervention (3.2518 mm Xience Alpine DES stent);  Surgeon: Troy Sine, MD;  Location: [place] CV LAB;  Service: Cardiovascular   CHOLECYSTECTOMY N/A 02/02/2019   Procedure: LAPAROSCOPIC CHOLECYSTECTOMY - LATEX ALLERGY;  Surgeon: Olean Ree, MD;  Location: [facility] ORS;  Service: General;  Laterality: N/A;   IMPLANTABLE CARDIOVERTER DEFIBRILLATOR (ICD) GENERATOR CHANGE Left 10/22/2018   Procedure: ICD GENERATOR INSERTION;  Surgeon: Marzetta Board, MD;  Location: [facility] ORS;  Service: Cardiovascular;  Laterality: Left;   INCISION AND DRAINAGE ABSCESS Left 04/09/2021   Procedure: INCISION AND DRAINAGE ABSCESS;  Surgeon: Robert Bellow, MD;  Location: [facility] ORS;  Service: General;  Laterality: Left;  breast   IR BILIARY DRAIN PLACEMENT WITH CHOLANGIOGRAM      There were no vitals filed for this visit.   Subjective Assessment - 10/01/21 1155     Subjective  Erika Rios presents for OT visit 7/36 to address LUE/LUQ post-mastectomy lymphedema. Pt is un-accompanied today. Pt brings custom compression armsleeve with her to  clinic for assessment and remake measurewments.Pt denies LE related pain.    Pertinent History Stage IIIa invasive mammary carcinoma, tripple +. S/P wide excision and ALND, 13/10+. S/p incision and drainage of L breast abcess/ seroma  04/09/21. Currently undergoing XRT to whole L breast and periferal LN. 2019 novel coronavirus, HTN,  Cardiomyopathy, MI, Ejection fraction 20-25%, Tobacco abuse. T2DM.    Limitations decreased endurance, obesity, Onset LUE/ LUQ sweling s/p  lumpectomy and SLNB (13/10+) 03/2021.    Special Tests -Stemmer; Intake FOTO  98/100    Patient Stated Goals get sleeve and learn about lymphedema    Currently in Pain? No/denies    Pain Onset --   5 years                         OT Treatments/Exercises (OP) - 10/01/21 1156       ADLs   ADL Education Given Yes      Manual Therapy   Manual Therapy Edema management    Edema Management repeated LLU measurements for custom compression armsleeve remake                    OT Education - 10/01/21 1157     Education Details Reviewed custom compression garment measurement, fitting and remake process. Pt understands manufactorer will issue a return authorization to her directly if they want faulty garment returned to them.    Person(s) Educated Patient    Methods Explanation;Demonstration;Handout    Comprehension Verbalized understanding;Returned demonstration                 OT Long Term Goals - 06/28/21 1135       OT LONG TERM GOAL #1   Title Given this patients Intake score of 98/100 on the functional outcomes FOTO tool, patient will experience an increase in function of 2 points or higher, to increase functional independence performance of basic and instrumental ADLs, including lymphedema self-care.    Baseline 98/100    Time 12    Period Weeks    Status New    Target Date 09/26/21      OT LONG TERM GOAL #2   Title Pt will demonstrate understanding of lymphedema prevention strategies by discussing 5 precautions using printed reference (modified assistance) to reduce risk of progression and to limit infection risk.    Baseline max A    Time 4    Period Days    Status New    Target Date --   4th OT Rx visit     OT LONG TERM GOAL #3   Title Pt will achieve at least a 10% limb volume reduction in LUE to return limb to normal size and shape,  to limit lymphedema progression, to limit infection risk, and to improve  ADLs performance.    Baseline  dependent    Time 12    Period Weeks    Status New    Target Date 09/26/21      OT LONG TERM GOAL #4   Title Pt will achieve and sustain a least 85% compliance with all LE self-care home program components throughout Intensive Phase CDTto limit LE progression.    Baseline dependent    Time 12    Period Weeks    Status New    Target Date 09/26/21      OT LONG TERM GOAL #5   Title Pt will be able to correctly don/ doff and position compression arm sleeve  using assistive device/s ( modified independence) to limit LE progression.    Baseline Max A    Time 12    Period Weeks    Status New    Target Date 09/26/21                   Plan - 10/01/21 1151     Clinical Impression Statement Repeated LUE anatomical measurements for custom compression armsleeve remake. Pt will keep existing garment in case manufacturer wants it returned for inspection. Pt will return it directly using an RA# from [place] will be shipped directly to this theraist for fitting and assessment. Hopefully fine tuning this garment will give good fit, effective containment as prophilactic measure and good commfort. OT will cqll Pt to schedule return visit once garment arrives.    OT Occupational Profile and History Comprehensive Assessment- Review of records and extensive additional review of physical, cognitive, psychosocial history related to current functional performance    Occupational performance deficits (Please refer to evaluation for details): ADL's;IADL's;Work;Leisure;Social Participation    Body Structure / Function / Physical Skills ADL;Skin integrity;UE functional use;Decreased knowledge of precautions;Decreased knowledge of use of DME;Edema    Rehab Potential Good    Clinical Decision Making Several treatment options, min-mod task modification necessary    Comorbidities Affecting Occupational Performance: Presence of comorbidities impacting occupational performance    Comorbidities  impacting occupational performance description: Cardiomyopathy,COPD, CAD, Hx MI, obesity, tobacco abuse    Modification or Assistance to Complete Evaluation  Min-Moderate modification of tasks or assist with assess necessary to complete eval    OT Frequency 2x / week    OT Duration 12 weeks   and PRN   OT Treatment/Interventions Self-care/ADL training;Therapeutic exercise;Manual Therapy;Coping strategies training;Therapeutic activities;Manual lymph drainage;Energy conservation;DME and/or AE instruction;Compression bandaging;Patient/family education;Other (comment)    Plan CDT: manual lymphatic drainage (MLD)  using defined anastamoses towards adjacent lymphatic watersheds to offload radiation field. No strokes in radiated field during and at least 8 weeks after XRT. Compression: Comparative limb volumetrics TBA at next visit to datermine wether compression wraps are needed, or if Pt can progress to garment directly. Therex for lymphatic pumping without overloading affected quadrant, and skin care with low ph castor oil to non-radiated skin to ensure optimal hydration and flexibility for MLD    OT Home Exercise Plan fit with appropriate compression arm sleeve    Recommended Other Services teach spouse simple self MLD if interested    Consulted and Agree with Plan of Care Patient             Patient will benefit from skilled therapeutic intervention in order to improve the following deficits and impairments:   Body Structure / Function / Physical Skills: ADL, Skin integrity, UE functional use, Decreased knowledge of precautions, Decreased knowledge of use of DME, Edema       Visit Diagnosis: Postmastectomy lymphedema syndrome - Plan: Ot plan of care cert/re-cert    Problem List Patient Active Problem List   Diagnosis Date Noted   Family history of cancer 08/28/2021   Carcinoma of breast, female, left ([place]) 01/24/2021   Liver abscess 11/26/2018   Acute cholecystitis 10/28/2018   AICD  (automatic cardioverter/defibrillator) present 17/49/4496   Chronic systolic heart failure ([place]) 10/22/2018   COPD, mild ([place]) 06/02/2018   SOB (shortness of breath) on exertion 02/17/2018   Diverticulosis of sigmoid colon 12/01/2017   HLD (hyperlipidemia) 10/15/2017   Low vitamin B12 level 06/13/2016   Cardiomyopathy, ischemic 04/07/2016  Unstable angina (HCC) 04/01/2016   Coronary artery disease involving native coronary artery of native heart without angina pectoris 03/13/2016   Status post coronary artery stent placement    STEMI (ST elevation myocardial infarction) ([place]) 02/24/2016   Tobacco abuse    ST elevation myocardial infarction (STEMI) of anterior wall, initial episode of care [facility])    ST elevation myocardial infarction involving left anterior descending (LAD) coronary artery ([place])    Andrey Spearman, MS, OTR/L, CLT-LANA 10/01/21 12:03 PM    [place] MAIN [facility] SERVICES [address] [place], [place], [address] Phone: [phone]   Fax:  [phone]  Name: Erika Rios MRN: [id number] Date of Birth: 1961-06-10

## 2021-10-16 ENCOUNTER — Telehealth: Payer: Self-pay | Admitting: *Deleted

## 2021-10-16 NOTE — Telephone Encounter (Signed)
Patient wants to cancel appoointment and does not want to reschedule. She requested no call back.

## 2021-10-22 ENCOUNTER — Ambulatory Visit: Payer: 59 | Attending: General Surgery | Admitting: Occupational Therapy

## 2021-10-22 ENCOUNTER — Other Ambulatory Visit: Payer: Self-pay

## 2021-10-22 DIAGNOSIS — I972 Postmastectomy lymphedema syndrome: Secondary | ICD-10-CM | POA: Insufficient documentation

## 2021-10-22 NOTE — Therapy (Signed)
[place] MAIN [facility] SERVICES [address] [place], [place], [address] Phone: [phone]   Fax:  [phone]  Occupational Therapy Treatment Note and Discharge Summary: Lymphedema Care  Patient Details  Name: Erika Rios MRN: [id number] Date of Birth: Oct 17, 1960 Referring Provider (OT): Robert Bellow, MD   Encounter Date: 10/22/2021   OT End of Session - 10/22/21 1104     Visit Number 8    Number of Visits 36    Date for OT Re-Evaluation 12/30/21    OT Start Time 1000    OT Stop Time 1045    OT Time Calculation (min) 45 min    Equipment Utilized During Treatment tyvek sleeve, friction gloves    Activity Tolerance Patient tolerated treatment well;No increased pain    Behavior During Therapy [facility] for tasks assessed/performed             Past Medical History:  Diagnosis Date   AICD (automatic cardioverter/defibrillator) present 10/22/2018   CAD (coronary artery disease)    CHF (congestive heart failure) (HCC)    COPD (chronic obstructive pulmonary disease) (HCC)    DOE (dyspnea on exertion)    Dyspnea    Family history of adverse reaction to anesthesia    (+) 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   GERD (gastroesophageal reflux disease)    History of 2019 novel coronavirus disease (COVID-19) 04/23/2020   HLD (hyperlipidemia)    HTN (hypertension)    Invasive carcinoma of breast ([place]) 01/23/2021   LEFT invasive mammary carcinoma; ER/PR (+), HER2/neu (+)   Ischemic cardiomyopathy    s/p AICD placement   ST elevation myocardial infarction (STEMI) of anterior wall, initial episode of care ([place]) 02/24/2016   DES x 1 to pLAD   Statin intolerance    T2DM (type 2 diabetes mellitus) ([place])    Tobacco abuse    quit smoking 2017    Past Surgical History:  Procedure Laterality Date   BREAST BIOPSY Right 12/10/2016   neg bx FIBROADENOMATOUS CHANGES WITH STROMAL AND LUMINAL CALCIFICATIONS. NEGATIVE   BREAST LUMPECTOMY Left  03/15/2021   lumpectomy with NL bracketing   BREAST LUMPECTOMY WITH NEEDLE LOCALIZATION Left 03/15/2021   Procedure: BREAST LUMPECTOMY WITH NEEDLE LOCALIZATION;  Surgeon: Robert Bellow, MD;  Location: [facility] ORS;  Service: General;  Laterality: Left;   CARDIAC CATHETERIZATION N/A 02/24/2016   Procedure: Left Heart Cath and Coronary Angiography;  Surgeon: Troy Sine, MD;  Location: [place] [facility] LAB;  Service: Cardiovascular;  Laterality: N/A;   CARDIAC CATHETERIZATION N/A 02/24/2016   Procedure: Coronary Stent Intervention (3.2518 mm Xience Alpine DES stent);  Surgeon: Troy Sine, MD;  Location: [place] [facility] LAB;  Service: Cardiovascular   CHOLECYSTECTOMY N/A 02/02/2019   Procedure: LAPAROSCOPIC CHOLECYSTECTOMY - LATEX ALLERGY;  Surgeon: Olean Ree, MD;  Location: [facility] ORS;  Service: General;  Laterality: N/A;   IMPLANTABLE CARDIOVERTER DEFIBRILLATOR (ICD) GENERATOR CHANGE Left 10/22/2018   Procedure: ICD GENERATOR INSERTION;  Surgeon: Marzetta Board, MD;  Location: [facility] ORS;  Service: Cardiovascular;  Laterality: Left;   INCISION AND DRAINAGE ABSCESS Left 04/09/2021   Procedure: INCISION AND DRAINAGE ABSCESS;  Surgeon: Robert Bellow, MD;  Location: [facility] ORS;  Service: General;  Laterality: Left;  breast   IR BILIARY DRAIN PLACEMENT WITH CHOLANGIOGRAM      There were no vitals filed for this visit.   Subjective Assessment - 10/22/21 1106     Subjective  [name] presents for OT visit 8/36  to address LUE/LUQ post-mastectomy lymphedema. Pt is un-accompanied today. Pt agrees to remade custom compression sleeve fitting. OT modified original garment and had it remade to improve comfort by reducing compression class from 2 to one and my reducing length and a couple of circumferential measurements to improve comfort.    Pertinent History Stage IIIa invasive mammary carcinoma, tripple +. S/P wide excision and ALND, 13/10+. S/p incision and drainage of L breast abcess/ seroma   04/09/21. Currently undergoing XRT to whole L breast and periferal LN. 2019 novel coronavirus, HTN,  Cardiomyopathy, MI, Ejection fraction 20-25%, Tobacco abuse. T2DM.    Limitations decreased endurance, obesity, Onset LUE/ LUQ sweling s/p lumpectomy and SLNB (13/10+) 03/2021.    Special Tests -[place]; Intake FOTO  98/100    Patient Stated Goals get sleeve and learn about lymphedema    Currently in Pain? Yes   L breast sore and L axilla numbness. Denies LE related pain. Pain not rated numerically   Pain Location Breast    Pain Orientation Left    Pain Descriptors / Indicators Sore    Pain Onset --   5 years                         OT Treatments/Exercises (OP) - 10/22/21 1109       ADLs   ADL Education Given Yes      Manual Therapy   Manual Therapy Edema management    Edema Management remake compression sleeve fitting                    OT Education - 10/22/21 1216     Education Details Custom compression garment fittingfand wear/ care instructions. Reviewed simple self MLD to LEU/LUQ and provided handout. discussed precautions.    Person(s) Educated Patient    Methods Explanation;Demonstration;Handout;Tactile cues;Verbal cues    Comprehension Verbalized understanding;Returned demonstration                 OT Long Term Goals - 10/22/21 1105       OT LONG TERM GOAL #1   Title Given this patients Intake score of 98/100 on the functional outcomes FOTO tool, patient will experience an increase in function of 2 points or higher, to increase functional independence performance of basic and instrumental ADLs, including lymphedema self-care.    Baseline 98/100    Time 12    Period Weeks    Status Deferred    Target Date 09/26/21      OT LONG TERM GOAL #2   Title Pt will demonstrate understanding of lymphedema prevention strategies by discussing 5 precautions using printed reference (modified assistance) to reduce risk of progression and to limit  infection risk.    Baseline max A    Time 4    Period Days    Status Achieved    Target Date --   4th OT Rx visit     OT LONG TERM GOAL #3   Title Pt will achieve at least a 10% limb volume reduction in LUE to return limb to normal size and shape,  to limit lymphedema progression, to limit infection risk, and to improve  ADLs performance.    Baseline dependent    Time 12    Period Weeks    Status Deferred    Target Date 09/26/21      OT LONG TERM GOAL #4   Title Pt will achieve and sustain a least 85% compliance with all LE  self-care home program components throughout Intensive Phase CDTto limit LE progression.    Baseline dependent    Time 12    Period Weeks    Status Achieved    Target Date 09/26/21      OT LONG TERM GOAL #5   Title Pt will be able to correctly don/ doff and position compression arm sleeve using assistive device/s ( modified independence) to limit LE progression.    Baseline Max A    Time 12    Period Weeks    Status Achieved    Target Date 09/26/21                   Plan - 10/22/21 1104     Clinical Impression Statement Completed fitting of remade profilactic LUE compression arm sleeve. Remake was decreased from ccl 2 to ccl 1 and garment measurements were changed to improve comfort. After skilled teaching Pt able to don sleeve with and without assistive devices. Reviewed wear and care schedule. Reviewed simple self MLD directing lympatic congestion towards ipsilateral axillary-inguinal anastamosis. Pt able to perform J stroke and feels confident with sequences. Handout given. OT faxed fitting sheet to vendor after [employer] and OK'd second garment using remake measurements and specs be sent directly to patient. Mrs. Loeza has met all OT goals and discharged from OT for lymphedema care this date. She agrees to call with any future questions or concerns.    OT Occupational Profile and History Comprehensive Assessment- Review of records and extensive  additional review of physical, cognitive, psychosocial history related to current functional performance    Occupational performance deficits (Please refer to evaluation for details): ADL's;IADL's;Work;Leisure;Social Participation    Body Structure / Function / Physical Skills ADL;Skin integrity;UE functional use;Decreased knowledge of precautions;Decreased knowledge of use of DME;Edema    Rehab Potential Good    Clinical Decision Making Several treatment options, min-mod task modification necessary    Comorbidities Affecting Occupational Performance: Presence of comorbidities impacting occupational performance    Comorbidities impacting occupational performance description: Cardiomyopathy,COPD, CAD, Hx MI, obesity, tobacco abuse    Modification or Assistance to Complete Evaluation  Min-Moderate modification of tasks or assist with assess necessary to complete eval    OT Frequency 2x / week    OT Duration 12 weeks   and PRN   OT Treatment/Interventions Self-care/ADL training;Therapeutic exercise;Manual Therapy;Coping strategies training;Therapeutic activities;Manual lymph drainage;Energy conservation;DME and/or AE instruction;Compression bandaging;Patient/family education;Other (comment)    Plan CDT: manual lymphatic drainage (MLD)  using defined anastamoses towards adjacent lymphatic watersheds to offload radiation field. No strokes in radiated field during and at least 8 weeks after XRT. Compression: Comparative limb volumetrics TBA at next visit to datermine wether compression wraps are needed, or if Pt can progress to garment directly. Therex for lymphatic pumping without overloading affected quadrant, and skin care with low ph castor oil to non-radiated skin to ensure optimal hydration and flexibility for MLD    OT Home Exercise Plan fit with appropriate compression arm sleeve    Recommended Other Services teach spouse simple self MLD if interested    Consulted and Agree with Plan of Care Patient              Patient will benefit from skilled therapeutic intervention in order to improve the following deficits and impairments:   Body Structure / Function / Physical Skills: ADL, Skin integrity, UE functional use, Decreased knowledge of precautions, Decreased knowledge of use of DME, Edema       Visit  Diagnosis: Postmastectomy lymphedema syndrome    Problem List Patient Active Problem List   Diagnosis Date Noted   Family history of cancer 08/28/2021   Carcinoma of breast, female, left ([place]) 01/24/2021   Liver abscess 11/26/2018   Acute cholecystitis 10/28/2018   AICD (automatic cardioverter/defibrillator) present 15/37/9432   Chronic systolic heart failure ([place]) 10/22/2018   COPD, mild ([place]) 06/02/2018   SOB (shortness of breath) on exertion 02/17/2018   Diverticulosis of sigmoid colon 12/01/2017   HLD (hyperlipidemia) 10/15/2017   Low vitamin B12 level 06/13/2016   Cardiomyopathy, ischemic 04/07/2016   Unstable angina ([place]) 04/01/2016   Coronary artery disease involving native coronary artery of native heart without angina pectoris 03/13/2016   Status post coronary artery stent placement    STEMI (ST elevation myocardial infarction) ([place]) 02/24/2016   Tobacco abuse    ST elevation myocardial infarction (STEMI) of anterior wall, initial episode of care [facility])    ST elevation myocardial infarction involving left anterior descending (LAD) coronary artery ([place])    Andrey Spearman, MS, OTR/L, CLT-LANA 10/22/21 12:22 PM   [place] MAIN [facility] SERVICES [place], [place], [address] Phone: [phone]   Fax:  [phone]  Name: SYRIA KESTNER MRN: [id number] Date of Birth: 17-Nov-1960

## 2021-12-11 ENCOUNTER — Other Ambulatory Visit: Payer: Self-pay | Admitting: General Surgery

## 2021-12-11 DIAGNOSIS — C50912 Malignant neoplasm of unspecified site of left female breast: Secondary | ICD-10-CM

## 2022-01-02 ENCOUNTER — Ambulatory Visit: Payer: 59 | Admitting: Oncology

## 2022-01-02 ENCOUNTER — Other Ambulatory Visit: Payer: 59

## 2022-01-17 ENCOUNTER — Ambulatory Visit
Admission: RE | Admit: 2022-01-17 | Discharge: 2022-01-17 | Disposition: A | Discharge status: Home / Self Care | Payer: 59 | Source: Ambulatory Visit | Attending: General Surgery | Admitting: General Surgery

## 2022-01-17 DIAGNOSIS — C50912 Malignant neoplasm of unspecified site of left female breast: Secondary | ICD-10-CM

## 2022-10-02 ENCOUNTER — Other Ambulatory Visit: Payer: Self-pay | Admitting: Internal Medicine

## 2022-10-02 DIAGNOSIS — C50912 Malignant neoplasm of unspecified site of left female breast: Secondary | ICD-10-CM

## 2022-11-26 ENCOUNTER — Other Ambulatory Visit: Payer: Self-pay | Admitting: Internal Medicine

## 2022-11-26 DIAGNOSIS — C50912 Malignant neoplasm of unspecified site of left female breast: Secondary | ICD-10-CM

## 2023-01-28 ENCOUNTER — Ambulatory Visit
Admission: RE | Admit: 2023-01-28 | Discharge: 2023-01-28 | Disposition: A | Discharge status: Home / Self Care | Payer: 59 | Source: Ambulatory Visit | Attending: Internal Medicine | Admitting: Internal Medicine

## 2023-01-28 DIAGNOSIS — Z170 Estrogen receptor positive status [ER+]: Secondary | ICD-10-CM | POA: Insufficient documentation

## 2023-01-28 DIAGNOSIS — C50912 Malignant neoplasm of unspecified site of left female breast: Secondary | ICD-10-CM | POA: Diagnosis present
